# Patient Record
Sex: Male | Born: 1940 | Race: White | Hispanic: No | Marital: Married | Smoking: Never smoker
Health system: Southern US, Community
[De-identification: ages and names within clinical notes are randomized; demographics above are authoritative.]

## PROBLEM LIST (undated history)

## (undated) DIAGNOSIS — K2980 Duodenitis without bleeding: Secondary | ICD-10-CM

## (undated) DIAGNOSIS — E079 Disorder of thyroid, unspecified: Secondary | ICD-10-CM

## (undated) DIAGNOSIS — E785 Hyperlipidemia, unspecified: Secondary | ICD-10-CM

## (undated) DIAGNOSIS — D126 Benign neoplasm of colon, unspecified: Secondary | ICD-10-CM

## (undated) DIAGNOSIS — K429 Umbilical hernia without obstruction or gangrene: Secondary | ICD-10-CM

## (undated) DIAGNOSIS — C8590 Non-Hodgkin lymphoma, unspecified, unspecified site: Secondary | ICD-10-CM

## (undated) DIAGNOSIS — N400 Enlarged prostate without lower urinary tract symptoms: Secondary | ICD-10-CM

## (undated) HISTORY — PX: COLONOSCOPY: SHX174

## (undated) HISTORY — DX: Non-Hodgkin lymphoma, unspecified, unspecified site: C85.90

## (undated) HISTORY — PX: APPENDECTOMY: SHX54

## (undated) HISTORY — PX: COLONOSCOPY W/ POLYPECTOMY: SHX1380

## (undated) HISTORY — DX: Benign neoplasm of colon, unspecified: D12.6

## (undated) HISTORY — DX: Duodenitis without bleeding: K29.80

## (undated) HISTORY — DX: Umbilical hernia without obstruction or gangrene: K42.9

## (undated) HISTORY — DX: Hyperlipidemia, unspecified: E78.5

## (undated) HISTORY — PX: POLYPECTOMY: SHX149

## (undated) HISTORY — DX: Disorder of thyroid, unspecified: E07.9

## (undated) HISTORY — DX: Benign prostatic hyperplasia without lower urinary tract symptoms: N40.0

---

## 2004-01-16 HISTORY — PX: INGUINAL HERNIA REPAIR: SHX194

## 2004-10-10 ENCOUNTER — Ambulatory Visit: Payer: Self-pay | Admitting: Gastroenterology

## 2004-11-15 ENCOUNTER — Ambulatory Visit: Payer: Self-pay | Admitting: Gastroenterology

## 2006-07-22 ENCOUNTER — Ambulatory Visit (HOSPITAL_COMMUNITY): Admission: RE | Admit: 2006-07-22 | Discharge: 2006-07-22 | Payer: Self-pay | Admitting: Family Medicine

## 2007-12-09 ENCOUNTER — Ambulatory Visit: Payer: Self-pay | Admitting: Gastroenterology

## 2007-12-22 ENCOUNTER — Encounter: Payer: Self-pay | Admitting: Gastroenterology

## 2007-12-22 ENCOUNTER — Ambulatory Visit: Payer: Self-pay | Admitting: Gastroenterology

## 2007-12-24 ENCOUNTER — Encounter: Payer: Self-pay | Admitting: Gastroenterology

## 2010-07-13 ENCOUNTER — Ambulatory Visit (AMBULATORY_SURGERY_CENTER): Payer: Medicare Other | Admitting: *Deleted

## 2010-07-13 VITALS — Ht 68.0 in | Wt 170.0 lb

## 2010-07-13 DIAGNOSIS — Z860100 Personal history of colon polyps, unspecified: Secondary | ICD-10-CM

## 2010-07-13 MED ORDER — PEG-KCL-NACL-NASULF-NA ASC-C 100 G PO SOLR: ORAL | Status: DC

## 2010-07-26 ENCOUNTER — Other Ambulatory Visit: Payer: Medicare Other | Admitting: Gastroenterology

## 2010-07-27 ENCOUNTER — Other Ambulatory Visit: Payer: Self-pay | Admitting: Gastroenterology

## 2010-07-31 ENCOUNTER — Ambulatory Visit (AMBULATORY_SURGERY_CENTER): Payer: Medicare Other | Admitting: Gastroenterology

## 2010-07-31 ENCOUNTER — Encounter: Payer: Self-pay | Admitting: Gastroenterology

## 2010-07-31 VITALS — BP 113/72 | HR 73 | Temp 97.7°F | Resp 20 | Ht 68.0 in | Wt 165.0 lb

## 2010-07-31 DIAGNOSIS — K6389 Other specified diseases of intestine: Secondary | ICD-10-CM

## 2010-07-31 DIAGNOSIS — Z86010 Personal history of colonic polyps: Secondary | ICD-10-CM

## 2010-07-31 DIAGNOSIS — D126 Benign neoplasm of colon, unspecified: Secondary | ICD-10-CM

## 2010-07-31 MED ORDER — SODIUM CHLORIDE 0.9 % IV SOLN: 500.0000 mL | INTRAVENOUS | Status: DC

## 2010-07-31 NOTE — Progress Notes (Signed)
15:53 hung second bag of n.s. 0.9%. mAW  Pt tolerated the exam very well. MAW

## 2010-07-31 NOTE — Progress Notes (Signed)
Please call pt wife, Augusto Gamble,  cell phone with results of pathology [phone].

## 2010-08-01 ENCOUNTER — Telehealth: Payer: Self-pay | Admitting: *Deleted

## 2010-08-01 DIAGNOSIS — R933 Abnormal findings on diagnostic imaging of other parts of digestive tract: Secondary | ICD-10-CM

## 2010-08-01 NOTE — Telephone Encounter (Signed)
Left message for pt to call back  °

## 2010-08-01 NOTE — Telephone Encounter (Signed)

## 2010-08-01 NOTE — Telephone Encounter (Signed)
Wife aware and they will have to come for labs and ct on 08/04/2010. I will call her back after everything is scheduled.

## 2010-08-01 NOTE — Telephone Encounter (Signed)
pts wife aware that pt needs to come at 11:30am on 08/04/2010 to the basement for labs before his CT scan, labs are in epic and ordered STAT. They will need to then come to the 3rd floor for ct contrast and instructions I have left at the front desk. I have went over the CT instructions but there is a written copy in the bag with the cntrast. They need to be at [facility] CT at 2:45pm for a 3 PM  CT. If there is any change in their schedule they will call me back .

## 2010-08-04 ENCOUNTER — Telehealth: Payer: Self-pay | Admitting: *Deleted

## 2010-08-04 ENCOUNTER — Other Ambulatory Visit: Payer: Self-pay | Admitting: Gastroenterology

## 2010-08-04 ENCOUNTER — Ambulatory Visit (INDEPENDENT_AMBULATORY_CARE_PROVIDER_SITE_OTHER)
Admission: RE | Admit: 2010-08-04 | Discharge: 2010-08-04 | Disposition: A | Discharge status: Home / Self Care | Payer: Medicare Other | Source: Ambulatory Visit | Attending: Gastroenterology | Admitting: Gastroenterology

## 2010-08-04 ENCOUNTER — Other Ambulatory Visit (INDEPENDENT_AMBULATORY_CARE_PROVIDER_SITE_OTHER): Payer: Medicare Other

## 2010-08-04 DIAGNOSIS — R933 Abnormal findings on diagnostic imaging of other parts of digestive tract: Secondary | ICD-10-CM

## 2010-08-04 DIAGNOSIS — R6889 Other general symptoms and signs: Secondary | ICD-10-CM

## 2010-08-04 DIAGNOSIS — K625 Hemorrhage of anus and rectum: Secondary | ICD-10-CM

## 2010-08-04 LAB — BUN: BUN: 11 mg/dL (ref 6–23)

## 2010-08-04 LAB — CREATININE, SERUM: Creatinine, Ser: 0.9 mg/dL (ref 0.4–1.5)

## 2010-08-04 MED ORDER — IOHEXOL 300 MG/ML  SOLN
100.0000 mL | Freq: Once | INTRAMUSCULAR | Status: AC | PRN
  Administered 2010-08-04: 100 mL via INTRAVENOUS

## 2010-08-04 NOTE — Telephone Encounter (Signed)
Notified pt we got the results from his CT scan, but until we get the final pathology report back, he can't make a dx;  Dr Jarold Motto is concerned with some of his lymph nodes.  Dr Jarold Motto, hopefully, will call him Monday with the results. Pt stated understanding and asked he be called on his cell number, [phone].

## 2010-08-07 ENCOUNTER — Encounter: Payer: Self-pay | Admitting: Gastroenterology

## 2010-08-07 NOTE — Telephone Encounter (Addendum)
Faxed COLON and path reports to Dr Truett Perna and call Dr Kathi Der ofc in [place] for labs. Informed pt we have sent the info to an oncologist and will call back when we hear from hi. Pt requested we call back on this number; [place]

## 2010-08-07 NOTE — Telephone Encounter (Signed)
I CALLED HIM.WE NEED LABS FROM [facility] IN [place].PLEASE CALL HIM PER ONCOLOGY EVALUATION WITH SHERRILL ASAP.Marland KitchenMarland KitchenMarland Kitchen

## 2010-08-11 NOTE — Telephone Encounter (Signed)
lmom for pt to call back. Pt has an appt with Dr Mancel Bale at [facility] on 08/29/10 at 1:30pm.

## 2010-08-14 NOTE — Telephone Encounter (Signed)
Addended by: Florene Glen on: 08/14/2010 11:46 AM   Modules accepted: Orders

## 2010-08-14 NOTE — Telephone Encounter (Signed)
lmom for pt to call back to schedule labs.

## 2010-08-14 NOTE — Telephone Encounter (Signed)
Notified pt to have labs drawn; he will try to come Wednesday, 08/16/10.

## 2010-08-14 NOTE — Telephone Encounter (Signed)
Spoke with pt this am who stated his wife tried to get an earlier appt with Dr Truett Perna, but he will be out of town 8/6-08/23/10 and 08/29/10 is ok with him if he can't get an earlier appt. Informed Crystal, scheduler at the [employer].

## 2010-08-14 NOTE — Telephone Encounter (Signed)
HAVE COME IN FOR LABS .Marland KitchenMarland KitchenANEMIA PROFILE AND [place] PROFILE,CELIAC SEROLOGY AND SERUM PROTEIN ELECTROPHORESIS,SED RATE AND CRP.Marland KitchenMarland KitchenMarland Kitchen

## 2010-08-16 ENCOUNTER — Encounter (INDEPENDENT_AMBULATORY_CARE_PROVIDER_SITE_OTHER): Payer: Medicare Other

## 2010-08-16 ENCOUNTER — Other Ambulatory Visit: Payer: Self-pay | Admitting: Gastroenterology

## 2010-08-16 ENCOUNTER — Telehealth: Payer: Self-pay | Admitting: Gastroenterology

## 2010-08-16 DIAGNOSIS — R195 Other fecal abnormalities: Secondary | ICD-10-CM

## 2010-08-16 DIAGNOSIS — K625 Hemorrhage of anus and rectum: Secondary | ICD-10-CM

## 2010-08-16 DIAGNOSIS — R6889 Other general symptoms and signs: Secondary | ICD-10-CM

## 2010-08-16 DIAGNOSIS — R109 Unspecified abdominal pain: Secondary | ICD-10-CM

## 2010-08-16 LAB — BASIC METABOLIC PANEL
BUN: 18 mg/dL (ref 6–23)
Calcium: 9 mg/dL (ref 8.4–10.5)
Chloride: 102 mEq/L (ref 96–112)
Creatinine, Ser: 0.8 mg/dL (ref 0.4–1.5)
GFR: 105.95 mL/min (ref >60.00)

## 2010-08-16 LAB — CBC WITH DIFFERENTIAL/PLATELET
Basophils Relative: 0.7 % (ref 0.0–3.0)
Eosinophils Relative: 3.6 % (ref 0.0–5.0)
Hemoglobin: 14.4 g/dL (ref 13.0–17.0)
MCV: 87.9 fl (ref 78.0–100.0)
Monocytes Absolute: 0.8 10*3/uL (ref 0.1–1.0)
Neutro Abs: 4.7 10*3/uL (ref 1.4–7.7)
Neutrophils Relative %: 56 % (ref 43.0–77.0)
RBC: 4.99 Mil/uL (ref 4.22–5.81)
WBC: 8.4 10*3/uL (ref 4.5–10.5)

## 2010-08-16 LAB — SEDIMENTATION RATE: Sed Rate: 7 mm/hr (ref 0–22)

## 2010-08-16 LAB — VITAMIN B12: Vitamin B-12: 383 pg/mL (ref 211–911)

## 2010-08-16 LAB — FERRITIN: Ferritin: 11.7 ng/mL — ABNORMAL LOW (ref 22.0–322.0)

## 2010-08-16 LAB — IBC PANEL
Iron: 130 ug/dL (ref 42–165)
Transferrin: 325.5 mg/dL (ref 212.0–360.0)

## 2010-08-16 LAB — FOLATE: Folate: 19.5 ng/mL (ref >5.9)

## 2010-08-16 NOTE — Progress Notes (Signed)
Called pt and got a verbal ok to run lab he is aware insurance may not cover and the cost is about $35.

## 2010-08-17 LAB — CELIAC PANEL 10
Endomysial Screen: NEGATIVE
Gliadin IgA: 6.9 U/mL (ref <20)
Gliadin IgG: 12.1 U/mL (ref <20)
IgA: 113 mg/dL (ref 68–379)
Tissue Transglut Ab: 19 U/mL (ref <20)

## 2010-08-17 LAB — HEPATIC FUNCTION PANEL
ALT: 23 U/L (ref 0–53)
Bilirubin, Direct: 0.2 mg/dL (ref 0.0–0.3)
Total Bilirubin: 0.9 mg/dL (ref 0.3–1.2)

## 2010-08-17 NOTE — Patient Instructions (Signed)
Error

## 2010-08-18 LAB — PROTEIN ELECTROPHORESIS, SERUM
Beta 2: 4.1 % (ref 3.2–6.5)
Gamma Globulin: 16.3 % (ref 11.1–18.8)
M-Spike, %: 0.53 g/dL

## 2010-08-18 NOTE — Progress Notes (Signed)
This encounter was created in error - please disregard.  This encounter was created in error - please disregard.

## 2010-08-29 ENCOUNTER — Other Ambulatory Visit: Payer: Self-pay | Admitting: Oncology

## 2010-08-29 ENCOUNTER — Encounter (HOSPITAL_BASED_OUTPATIENT_CLINIC_OR_DEPARTMENT_OTHER): Payer: Medicare Other | Admitting: Oncology

## 2010-08-29 DIAGNOSIS — R918 Other nonspecific abnormal finding of lung field: Secondary | ICD-10-CM

## 2010-08-29 DIAGNOSIS — C8590 Non-Hodgkin lymphoma, unspecified, unspecified site: Secondary | ICD-10-CM

## 2010-08-29 DIAGNOSIS — D126 Benign neoplasm of colon, unspecified: Secondary | ICD-10-CM

## 2010-09-13 ENCOUNTER — Encounter (HOSPITAL_BASED_OUTPATIENT_CLINIC_OR_DEPARTMENT_OTHER): Payer: Medicare Other | Admitting: Oncology

## 2010-09-13 ENCOUNTER — Encounter (HOSPITAL_COMMUNITY)
Admission: RE | Admit: 2010-09-13 | Discharge: 2010-09-13 | Disposition: A | Discharge status: Home / Self Care | Payer: Medicare Other | Source: Ambulatory Visit | Attending: Oncology | Admitting: Oncology

## 2010-09-13 ENCOUNTER — Telehealth: Payer: Self-pay | Admitting: Gastroenterology

## 2010-09-13 DIAGNOSIS — Z79899 Other long term (current) drug therapy: Secondary | ICD-10-CM | POA: Insufficient documentation

## 2010-09-13 DIAGNOSIS — C772 Secondary and unspecified malignant neoplasm of intra-abdominal lymph nodes: Secondary | ICD-10-CM

## 2010-09-13 DIAGNOSIS — C8590 Non-Hodgkin lymphoma, unspecified, unspecified site: Secondary | ICD-10-CM

## 2010-09-13 DIAGNOSIS — M47817 Spondylosis without myelopathy or radiculopathy, lumbosacral region: Secondary | ICD-10-CM | POA: Insufficient documentation

## 2010-09-13 DIAGNOSIS — R935 Abnormal findings on diagnostic imaging of other abdominal regions, including retroperitoneum: Secondary | ICD-10-CM | POA: Insufficient documentation

## 2010-09-13 DIAGNOSIS — C8589 Other specified types of non-Hodgkin lymphoma, extranodal and solid organ sites: Secondary | ICD-10-CM | POA: Insufficient documentation

## 2010-09-13 MED ORDER — FLUDEOXYGLUCOSE F - 18 (FDG) INJECTION
18.0000 | Freq: Once | INTRAVENOUS | Status: AC | PRN
  Administered 2010-09-13: 18 via INTRAVENOUS

## 2010-09-14 ENCOUNTER — Telehealth: Payer: Self-pay | Admitting: *Deleted

## 2010-09-14 LAB — GLUCOSE, CAPILLARY: Glucose-Capillary: 121 mg/dL — ABNORMAL HIGH (ref 70–99)

## 2010-09-14 NOTE — Telephone Encounter (Signed)
Pt scheduled for EGD and FLEX per Dr Truett Perna request, pt coming tomorrow for pre visit and 09/25/2010 for procedures.

## 2010-09-14 NOTE — Telephone Encounter (Signed)
appt scheduled

## 2010-09-14 NOTE — Telephone Encounter (Signed)
Spoke with Dr. Truett Perna in oncology. He is recommended deepertissue biopsies of his colonic lesions and also endoscopy with gastric biopsies and exam for H. pylori. Recent PET scan did not show any abnormal nodes. He feels that these changes are either reactive or that the patient has a low-grade MALT tumor. We will schedule these procedures are with the patient as recommended.

## 2010-09-15 ENCOUNTER — Ambulatory Visit (AMBULATORY_SURGERY_CENTER): Payer: Medicare Other | Admitting: *Deleted

## 2010-09-15 VITALS — Ht 68.0 in | Wt 172.6 lb

## 2010-09-15 DIAGNOSIS — R933 Abnormal findings on diagnostic imaging of other parts of digestive tract: Secondary | ICD-10-CM

## 2010-09-25 ENCOUNTER — Encounter: Payer: Self-pay | Admitting: Gastroenterology

## 2010-09-25 ENCOUNTER — Ambulatory Visit (AMBULATORY_SURGERY_CENTER): Payer: Medicare Other | Admitting: Gastroenterology

## 2010-09-25 VITALS — BP 118/67 | HR 63 | Temp 96.7°F | Resp 20 | Ht 68.0 in | Wt 172.0 lb

## 2010-09-25 DIAGNOSIS — R933 Abnormal findings on diagnostic imaging of other parts of digestive tract: Secondary | ICD-10-CM | POA: Insufficient documentation

## 2010-09-25 DIAGNOSIS — K317 Polyp of stomach and duodenum: Secondary | ICD-10-CM

## 2010-09-25 DIAGNOSIS — K529 Noninfective gastroenteritis and colitis, unspecified: Secondary | ICD-10-CM

## 2010-09-25 DIAGNOSIS — K2990 Gastroduodenitis, unspecified, without bleeding: Secondary | ICD-10-CM | POA: Insufficient documentation

## 2010-09-25 DIAGNOSIS — K5289 Other specified noninfective gastroenteritis and colitis: Secondary | ICD-10-CM

## 2010-09-25 DIAGNOSIS — C8589 Other specified types of non-Hodgkin lymphoma, extranodal and solid organ sites: Secondary | ICD-10-CM

## 2010-09-25 HISTORY — DX: Polyp of stomach and duodenum: K31.7

## 2010-09-25 HISTORY — DX: Noninfective gastroenteritis and colitis, unspecified: K52.9

## 2010-09-25 MED ORDER — SODIUM CHLORIDE 0.9 % IV SOLN: 500.0000 mL | INTRAVENOUS | Status: DC

## 2010-09-25 NOTE — Patient Instructions (Signed)
Discharge instructions given with verbal understanding. Handout on gastritis given. Resume previous medications.

## 2010-09-26 ENCOUNTER — Telehealth: Payer: Self-pay | Admitting: *Deleted

## 2010-09-26 NOTE — Telephone Encounter (Signed)

## 2010-10-09 ENCOUNTER — Telehealth: Payer: Self-pay | Admitting: *Deleted

## 2010-10-09 NOTE — Telephone Encounter (Signed)
Spoke with pt to inform him we haven't forgot about him. We have not heard from pathology and when we called last week, the ofc stated the bx had been sent to [place]. I spoke with Dr Truett Perna who stated he still suspects Lymphoma, but they have to know which type and that's why the bx was sent to there for [facility]. Pt reminded he has a f/u with Dr Truett Perna this week and Dr Truett Perna hopes to have an answer for him then. Pt stated understanding. Informed Dr Jarold Motto.

## 2010-10-12 ENCOUNTER — Encounter (HOSPITAL_BASED_OUTPATIENT_CLINIC_OR_DEPARTMENT_OTHER): Payer: Medicare Other | Admitting: Oncology

## 2010-10-12 DIAGNOSIS — C8589 Other specified types of non-Hodgkin lymphoma, extranodal and solid organ sites: Secondary | ICD-10-CM

## 2010-11-14 ENCOUNTER — Other Ambulatory Visit: Payer: Self-pay | Admitting: Oncology

## 2010-11-14 ENCOUNTER — Encounter (HOSPITAL_BASED_OUTPATIENT_CLINIC_OR_DEPARTMENT_OTHER): Payer: Medicare Other | Admitting: Oncology

## 2010-11-14 DIAGNOSIS — C8583 Other specified types of non-Hodgkin lymphoma, intra-abdominal lymph nodes: Secondary | ICD-10-CM

## 2010-11-14 DIAGNOSIS — C772 Secondary and unspecified malignant neoplasm of intra-abdominal lymph nodes: Secondary | ICD-10-CM

## 2010-11-14 DIAGNOSIS — Z23 Encounter for immunization: Secondary | ICD-10-CM

## 2010-11-14 LAB — CBC WITH DIFFERENTIAL/PLATELET
Basophils Absolute: 0 10*3/uL (ref 0.0–0.1)
Eosinophils Absolute: 0.2 10*3/uL (ref 0.0–0.5)
HGB: 14.7 g/dL (ref 13.0–17.1)
LYMPH%: 35.8 % (ref 14.0–49.0)
MCH: 30.5 pg (ref 27.2–33.4)
MCHC: 33.4 g/dL (ref 32.0–36.0)
MCV: 91.2 fL (ref 79.3–98.0)
MONO#: 0.7 10*3/uL (ref 0.1–0.9)
NEUT%: 50.9 % (ref 39.0–75.0)
RBC: 4.83 10*6/uL (ref 4.20–5.82)

## 2010-11-15 ENCOUNTER — Encounter (HOSPITAL_BASED_OUTPATIENT_CLINIC_OR_DEPARTMENT_OTHER): Payer: Medicare Other | Admitting: Oncology

## 2010-11-15 ENCOUNTER — Other Ambulatory Visit: Payer: Self-pay | Admitting: Oncology

## 2010-11-15 DIAGNOSIS — C772 Secondary and unspecified malignant neoplasm of intra-abdominal lymph nodes: Secondary | ICD-10-CM

## 2010-11-15 LAB — COMPREHENSIVE METABOLIC PANEL
ALT: 27 U/L (ref 0–53)
BUN: 18 mg/dL (ref 6–23)
CO2: 25 mEq/L (ref 19–32)
Calcium: 9.3 mg/dL (ref 8.4–10.5)
Chloride: 105 mEq/L (ref 96–112)
Creatinine, Ser: 0.94 mg/dL (ref 0.50–1.35)

## 2010-11-16 LAB — HEPATITIS B CORE ANTIBODY, TOTAL: Hep B Core Total Ab: NEGATIVE

## 2010-11-16 LAB — HEPATITIS C ANTIBODY: HCV Ab: NEGATIVE

## 2010-11-16 LAB — HEPATITIS B SURFACE ANTIGEN: Hepatitis B Surface Ag: NEGATIVE

## 2010-11-27 ENCOUNTER — Other Ambulatory Visit: Payer: Self-pay | Admitting: Oncology

## 2010-11-27 ENCOUNTER — Encounter: Payer: Self-pay | Admitting: Oncology

## 2010-11-27 DIAGNOSIS — C8590 Non-Hodgkin lymphoma, unspecified, unspecified site: Secondary | ICD-10-CM

## 2010-11-27 HISTORY — DX: Non-Hodgkin lymphoma, unspecified, unspecified site: C85.90

## 2010-11-28 ENCOUNTER — Ambulatory Visit (HOSPITAL_BASED_OUTPATIENT_CLINIC_OR_DEPARTMENT_OTHER): Payer: Medicare Other

## 2010-11-28 VITALS — BP 128/60 | HR 95 | Temp 98.1°F

## 2010-11-28 DIAGNOSIS — C8583 Other specified types of non-Hodgkin lymphoma, intra-abdominal lymph nodes: Secondary | ICD-10-CM

## 2010-11-28 DIAGNOSIS — Z5112 Encounter for antineoplastic immunotherapy: Secondary | ICD-10-CM

## 2010-11-28 DIAGNOSIS — C8590 Non-Hodgkin lymphoma, unspecified, unspecified site: Secondary | ICD-10-CM

## 2010-11-28 MED ORDER — ALTEPLASE 2 MG IJ SOLR
2.0000 mg | Freq: Once | INTRAMUSCULAR | Status: DC | PRN
  Filled 2010-11-28: qty 2

## 2010-11-28 MED ORDER — SODIUM CHLORIDE 0.9 % IV SOLN
Freq: Once | INTRAVENOUS | Status: AC
  Administered 2010-11-28: 09:00:00 via INTRAVENOUS

## 2010-11-28 MED ORDER — HEPARIN SOD (PORK) LOCK FLUSH 100 UNIT/ML IV SOLN
500.0000 [IU] | Freq: Once | INTRAVENOUS | Status: DC | PRN
  Filled 2010-11-28: qty 5

## 2010-11-28 MED ORDER — SODIUM CHLORIDE 0.9 % IJ SOLN
10.0000 mL | INTRAMUSCULAR | Status: DC | PRN
  Filled 2010-11-28: qty 10

## 2010-11-28 MED ORDER — SODIUM CHLORIDE 0.9 % IJ SOLN
3.0000 mL | INTRAMUSCULAR | Status: DC | PRN
  Filled 2010-11-28: qty 10

## 2010-11-28 MED ORDER — SODIUM CHLORIDE 0.9 % IV SOLN
375.0000 mg/m2 | Freq: Once | INTRAVENOUS | Status: AC
  Administered 2010-11-28: 700 mg via INTRAVENOUS
  Filled 2010-11-28: qty 70

## 2010-11-28 MED ORDER — DIPHENHYDRAMINE HCL 25 MG PO CAPS
50.0000 mg | ORAL_CAPSULE | Freq: Once | ORAL | Status: AC
  Administered 2010-11-28: 50 mg via ORAL

## 2010-11-28 MED ORDER — HEPARIN SOD (PORK) LOCK FLUSH 100 UNIT/ML IV SOLN
250.0000 [IU] | Freq: Once | INTRAVENOUS | Status: DC | PRN
  Filled 2010-11-28: qty 5

## 2010-11-28 MED ORDER — ACETAMINOPHEN 325 MG PO TABS
650.0000 mg | ORAL_TABLET | Freq: Once | ORAL | Status: AC
  Administered 2010-11-28: 650 mg via ORAL

## 2010-11-28 NOTE — Progress Notes (Signed)
Rituxan started at  66mls/hr X at 1020.

## 2010-11-29 ENCOUNTER — Telehealth: Payer: Self-pay | Admitting: *Deleted

## 2010-11-29 NOTE — Telephone Encounter (Signed)
Brad May is doing"fine, even better than expected.   No repercussions from treatment."  He is eating and drinking well.  Denies questions at this time.  Encouraged to call if needed.

## 2010-11-29 NOTE — Telephone Encounter (Signed)
Message copied by Augusto Garbe on Wed Nov 29, 2010 11:37 AM ------      Message from: Ardell Isaacs      Created: Tue Nov 28, 2010 11:32 AM      Regarding: Chemo follow up call      Contact: [phone]       Wife Augusto Gamble will be there  In and out, Gabriel Rung will be there all day.  1st time Rituxan therapy for Lymphoma

## 2010-12-05 ENCOUNTER — Ambulatory Visit (HOSPITAL_BASED_OUTPATIENT_CLINIC_OR_DEPARTMENT_OTHER): Payer: Medicare Other

## 2010-12-05 ENCOUNTER — Other Ambulatory Visit: Payer: Self-pay | Admitting: Oncology

## 2010-12-05 VITALS — BP 117/62 | HR 64 | Temp 99.1°F

## 2010-12-05 DIAGNOSIS — Z5112 Encounter for antineoplastic immunotherapy: Secondary | ICD-10-CM

## 2010-12-05 DIAGNOSIS — C8583 Other specified types of non-Hodgkin lymphoma, intra-abdominal lymph nodes: Secondary | ICD-10-CM

## 2010-12-05 DIAGNOSIS — C8590 Non-Hodgkin lymphoma, unspecified, unspecified site: Secondary | ICD-10-CM

## 2010-12-05 MED ORDER — SODIUM CHLORIDE 0.9 % IV SOLN
375.0000 mg/m2 | Freq: Once | INTRAVENOUS | Status: AC
  Administered 2010-12-05: 700 mg via INTRAVENOUS
  Filled 2010-12-05: qty 70

## 2010-12-05 MED ORDER — SODIUM CHLORIDE 0.9 % IV SOLN
Freq: Once | INTRAVENOUS | Status: AC
  Administered 2010-12-05: 11:00:00 via INTRAVENOUS

## 2010-12-05 MED ORDER — DIPHENHYDRAMINE HCL 25 MG PO CAPS
50.0000 mg | ORAL_CAPSULE | Freq: Once | ORAL | Status: AC
  Administered 2010-12-05: 50 mg via ORAL

## 2010-12-05 MED ORDER — ACETAMINOPHEN 325 MG PO TABS
650.0000 mg | ORAL_TABLET | Freq: Once | ORAL | Status: AC
  Administered 2010-12-05: 650 mg via ORAL

## 2010-12-05 NOTE — Progress Notes (Signed)
Pt stable.  Rate increased to 182 ml's/hr for remainder of dose.

## 2010-12-05 NOTE — Progress Notes (Signed)
rituxan rate increased to 200mg /hr= 91ml x 45ml.  Vss.  D Zareth Rippetoe rn

## 2010-12-05 NOTE — Progress Notes (Signed)
rituxan rate increased to 300mg /hr=143ml x 62ml. Vss.  D Keyaan Lederman rn

## 2010-12-13 ENCOUNTER — Other Ambulatory Visit: Payer: Self-pay | Admitting: Oncology

## 2010-12-14 ENCOUNTER — Ambulatory Visit (HOSPITAL_BASED_OUTPATIENT_CLINIC_OR_DEPARTMENT_OTHER): Payer: Medicare Other | Admitting: Nurse Practitioner

## 2010-12-14 ENCOUNTER — Other Ambulatory Visit (HOSPITAL_BASED_OUTPATIENT_CLINIC_OR_DEPARTMENT_OTHER): Payer: Medicare Other | Admitting: Lab

## 2010-12-14 ENCOUNTER — Ambulatory Visit (HOSPITAL_BASED_OUTPATIENT_CLINIC_OR_DEPARTMENT_OTHER): Payer: Medicare Other

## 2010-12-14 ENCOUNTER — Other Ambulatory Visit: Payer: Self-pay | Admitting: Oncology

## 2010-12-14 VITALS — BP 128/83 | HR 69 | Temp 98.7°F | Ht 68.0 in | Wt 174.2 lb

## 2010-12-14 VITALS — BP 103/72 | HR 68 | Temp 97.8°F

## 2010-12-14 DIAGNOSIS — C8590 Non-Hodgkin lymphoma, unspecified, unspecified site: Secondary | ICD-10-CM

## 2010-12-14 DIAGNOSIS — C8583 Other specified types of non-Hodgkin lymphoma, intra-abdominal lymph nodes: Secondary | ICD-10-CM

## 2010-12-14 DIAGNOSIS — Z5112 Encounter for antineoplastic immunotherapy: Secondary | ICD-10-CM

## 2010-12-14 LAB — CBC WITH DIFFERENTIAL/PLATELET
BASO%: 1 % (ref 0.0–2.0)
Basophils Absolute: 0.1 10*3/uL (ref 0.0–0.1)
HCT: 44 % (ref 38.4–49.9)
HGB: 15.2 g/dL (ref 13.0–17.1)
MCHC: 34.5 g/dL (ref 32.0–36.0)
MONO#: 0.7 10*3/uL (ref 0.1–0.9)
NEUT%: 39.2 % (ref 39.0–75.0)
WBC: 6 10*3/uL (ref 4.0–10.3)
lymph#: 2.7 10*3/uL (ref 0.9–3.3)

## 2010-12-14 MED ORDER — DIPHENHYDRAMINE HCL 25 MG PO CAPS
50.0000 mg | ORAL_CAPSULE | Freq: Once | ORAL | Status: AC
  Administered 2010-12-14: 50 mg via ORAL

## 2010-12-14 MED ORDER — ACETAMINOPHEN 325 MG PO TABS
650.0000 mg | ORAL_TABLET | Freq: Once | ORAL | Status: AC
  Administered 2010-12-14: 650 mg via ORAL

## 2010-12-14 MED ORDER — SODIUM CHLORIDE 0.9 % IV SOLN
Freq: Once | INTRAVENOUS | Status: AC
  Administered 2010-12-14: 14:00:00 via INTRAVENOUS

## 2010-12-14 MED ORDER — SODIUM CHLORIDE 0.9 % IV SOLN
375.0000 mg/m2 | Freq: Once | INTRAVENOUS | Status: AC
  Administered 2010-12-14: 700 mg via INTRAVENOUS
  Filled 2010-12-14: qty 70

## 2010-12-14 NOTE — Progress Notes (Signed)
OFFICE PROGRESS NOTE  Interval history:  Brad May returns as scheduled. He completed week 1 of Rituxan 11/28/2010 and week 2 12/05/2010. He felt "chilled" during the first infusion. He did not develop rigors. He completed the infusion without difficulty. He denies fevers. No sweats. No skin rash. He denies pain. He continues to note intermittent rectal bleeding. Overall the bleeding has improved.   Objective: Blood pressure 128/83, pulse 69, temperature 98.7 F (37.1 C), temperature source Oral, height 5\' 8"  (1.727 m), weight 174 lb 3.2 oz (79.017 kg).  Oropharynx is without thrush or ulceration. No palpable cervical, supraclavicular, right axillary or inguinal lymph nodes. Approximate 1 cm high left axillary lymph node. Lungs are clear. No wheezes or rales. Regular cardiac rhythm. Abdomen is soft and nontender. No organomegaly. Extremities are without edema. Calves soft and nontender.   Lab Results: Hemoglobin 15.2 white count 6.0 absolute neutrophil count 2.4 platelet count 189,000 Ferritin 13 on 11/14/2010  Studies/Results: No results found.  Medications: I have reviewed the patient's current medications.  Assessment/Plan:  1. History of intermittent rectal bleeding. 2. 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   a. 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   b. CT of the abdomen and pelvis 08/04/2010 with abdominal/pelvic lymphadenopathy. c. PET scan 09/13/2010 confirming mildly enlarged abdominal/pelvic lymph nodes with borderline increased FDG activity. 3. History of iron deficiency.  Ferritin was in the low normal range on 11/14/2010. Hemoglobin remains normal. 4. ? left upper lung nodule on a chest x-ray  08/16/2010.   Disposition-Brad May appears stable. Plan to proceed with week 3 Rituxan today as scheduled. He will return for the fourth and final treatment in one week. He will return for a followup visit with Brad May on 01/12/2011. He will contact the office in the interim with any problems.  Plan reviewed with Brad May.  Lonna Cobb ANP/GNP-BC

## 2010-12-14 NOTE — Progress Notes (Signed)
At 1530hr, v/s and pt. Stable.  Rituxan rate increase to 200mg /hr x81ml/hr x63ml. HL

## 2010-12-14 NOTE — Progress Notes (Signed)
1600: VSS pt tolerating Rituxan well. Rate increased to 174mls/hr X 69 mls. 1630: Continues to tolerate well. Rate increased to 147mls/hr X .

## 2010-12-14 NOTE — Patient Instructions (Signed)
[facility] Discharge Instructions for Patients Receiving Chemotherapy  Today you received the following Ritxuan.  BELOW ARE SYMPTOMS THAT SHOULD BE REPORTED IMMEDIATELY:  *FEVER GREATER THAN 100.5 F  *CHILLS WITH OR WITHOUT FEVER  NAUSEA AND VOMITING THAT IS NOT CONTROLLED WITH YOUR NAUSEA MEDICATION  *UNUSUAL SHORTNESS OF BREATH  *UNUSUAL BRUISING OR BLEEDING  TENDERNESS IN MOUTH AND THROAT WITH OR WITHOUT PRESENCE OF ULCERS  *URINARY PROBLEMS  *BOWEL PROBLEMS  UNUSUAL RASH Items with * indicate a potential emergency and should be followed up as soon as possible.  One of the nurses will contact you 24 hours after your treatment. Please let the nurse know about any problems that you may have experienced. Feel free to call the clinic you have any questions or concerns. The clinic phone number is [phone].   I have been informed and understand all the instructions given to me. I know to contact the clinic, my physician, or go to the Emergency Department if any problems should occur. I do not have any questions at this time, but understand that I may call the clinic during office hours   should I have any questions or need assistance in obtaining follow up care.    __________________________________________  _____________  __________ Signature of Patient or Authorized Representative            Date                   Time    __________________________________________ Nurse's Signature

## 2010-12-14 NOTE — Progress Notes (Signed)
1745--infusion complete, pt tolerated well.  SLJ

## 2010-12-14 NOTE — Progress Notes (Signed)
Rituxan started at 1455 pm  ,   Vss,  TB

## 2010-12-20 ENCOUNTER — Other Ambulatory Visit: Payer: Self-pay | Admitting: Oncology

## 2010-12-21 ENCOUNTER — Ambulatory Visit (HOSPITAL_BASED_OUTPATIENT_CLINIC_OR_DEPARTMENT_OTHER): Payer: Medicare Other

## 2010-12-21 VITALS — BP 118/70 | HR 66 | Temp 98.8°F

## 2010-12-21 DIAGNOSIS — Z5112 Encounter for antineoplastic immunotherapy: Secondary | ICD-10-CM

## 2010-12-21 DIAGNOSIS — C8583 Other specified types of non-Hodgkin lymphoma, intra-abdominal lymph nodes: Secondary | ICD-10-CM

## 2010-12-21 DIAGNOSIS — C8590 Non-Hodgkin lymphoma, unspecified, unspecified site: Secondary | ICD-10-CM

## 2010-12-21 MED ORDER — DIPHENHYDRAMINE HCL 25 MG PO CAPS
50.0000 mg | ORAL_CAPSULE | Freq: Once | ORAL | Status: AC
  Administered 2010-12-21: 50 mg via ORAL

## 2010-12-21 MED ORDER — SODIUM CHLORIDE 0.9 % IV SOLN
Freq: Once | INTRAVENOUS | Status: AC
  Administered 2010-12-21: 11:00:00 via INTRAVENOUS

## 2010-12-21 MED ORDER — SODIUM CHLORIDE 0.9 % IV SOLN
375.0000 mg/m2 | Freq: Once | INTRAVENOUS | Status: AC
  Administered 2010-12-21: 700 mg via INTRAVENOUS
  Filled 2010-12-21: qty 70

## 2010-12-21 MED ORDER — ACETAMINOPHEN 325 MG PO TABS
650.0000 mg | ORAL_TABLET | Freq: Once | ORAL | Status: AC
  Administered 2010-12-21: 650 mg via ORAL

## 2010-12-21 NOTE — Progress Notes (Signed)
Rituxan infusion started at 100 mg/hr: 11mL/hr X 23mL.  Rate increased to 200 mg/hr: 43mL/hr X 46 mL.  VSS. Patient has no complaints.  Rate increased to 300 mg/hr: 136 mL/hr X 68 mL.  VSS.  Patient has no complaints.  Rate increased to 400 mg/hr: 182 mL/hr X 91 mL.  VSS.  Patient has no complaints.    Rate remains 182 mL/hr for remainder of infusion.

## 2011-01-12 ENCOUNTER — Telehealth: Payer: Self-pay | Admitting: Oncology

## 2011-01-12 ENCOUNTER — Ambulatory Visit (HOSPITAL_BASED_OUTPATIENT_CLINIC_OR_DEPARTMENT_OTHER): Payer: Medicare Other | Admitting: Oncology

## 2011-01-12 ENCOUNTER — Telehealth: Payer: Self-pay | Admitting: Gastroenterology

## 2011-01-12 ENCOUNTER — Other Ambulatory Visit (HOSPITAL_BASED_OUTPATIENT_CLINIC_OR_DEPARTMENT_OTHER): Payer: Medicare Other | Admitting: Lab

## 2011-01-12 VITALS — BP 127/78 | HR 71 | Temp 96.9°F | Ht 68.0 in | Wt 177.4 lb

## 2011-01-12 DIAGNOSIS — C8589 Other specified types of non-Hodgkin lymphoma, extranodal and solid organ sites: Secondary | ICD-10-CM

## 2011-01-12 DIAGNOSIS — C8590 Non-Hodgkin lymphoma, unspecified, unspecified site: Secondary | ICD-10-CM

## 2011-01-12 DIAGNOSIS — Z79899 Other long term (current) drug therapy: Secondary | ICD-10-CM

## 2011-01-12 LAB — CBC WITH DIFFERENTIAL/PLATELET
EOS%: 5.4 % (ref 0.0–7.0)
Eosinophils Absolute: 0.4 10*3/uL (ref 0.0–0.5)
LYMPH%: 40.3 % (ref 14.0–49.0)
MCH: 31.1 pg (ref 27.2–33.4)
MCHC: 34.4 g/dL (ref 32.0–36.0)
MCV: 90.4 fL (ref 79.3–98.0)
MONO%: 10.5 % (ref 0.0–14.0)
Platelets: 191 10*3/uL (ref 140–400)
RBC: 4.94 10*6/uL (ref 4.20–5.82)
RDW: 14 % (ref 11.0–14.6)

## 2011-01-12 NOTE — Progress Notes (Signed)
OFFICE PROGRESS NOTE   INTERVAL HISTORY:   He completed the last treatment with Rituxan on 12/21/2010. He reports less abdominal bloating. The rectal bleeding has diminished. He feels well. He has a good appetite and energy level. He has an occasional cough. He denies shortness of breath and fever. He is taking a probiotic.  Objective:  Vital signs in last 24 hours:  Blood pressure 127/78, pulse 71, temperature 96.9 F (36.1 C), temperature source Oral, height 5\' 8"  (1.727 m), weight 177 lb 6.4 oz (80.468 kg).    HEENT: Neck without mass Lymphatics: No cervical, supraclavicular, or inguinal nodes. Prominent right greater than left axillary fat pads.? 1 cm right axillary node. Resp: Few scattered end inspiratory rales. Good air movement bilaterally. No respiratory distress. Cardio: Regular rate and rhythm GI: No hepatosplenomegaly. Nontender. Vascular:  No leg edema      Lab Results:  Lab Results  Component Value Date   WBC 7.1 01/12/2011   HGB 15.4 01/12/2011   HCT 44.7 01/12/2011   MCV 90.4 01/12/2011   PLT 191 01/12/2011   ANC 3.1   Medications: I have reviewed the patient's current medications.  Assessment/Plan: 1. History of intermittent rectal bleeding. 2. 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  a. 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  b. CT of the abdomen and pelvis 08/04/2010 with abdominal/pelvic lymphadenopathy. c. PET scan 09/13/2010 confirming mildly enlarged abdominal/pelvic lymph nodes with borderline increased FDG activity. d. Initiation of single agent rituximab therapy on 11/28/2010, he completed 4 weekly treatments on 12/21/2010 3. History of iron deficiency. Ferritin was in the  low normal range on 11/14/2010. Hemoglobin remains normal. 4. ? left upper lung nodule on a chest x-ray 08/16/2010   Disposition:  He tolerated the 4 weeks of rituximab without significant acute toxicity. He appears well today. We will refer him to Dr. Jarold Motto for a restaging sigmoidoscopy in approximately one month. We will schedule a restaging PET scan if there is not clear evidence of improvement on the sigmoidoscopy.  He will return for an office visit in 2 months. He will contact us in the interim for new symptoms.   Lucile Shutters, MD  01/12/2011  6:02 PM

## 2011-01-12 NOTE — Telephone Encounter (Signed)
Called pt who states he needs a f/u prior to his 03/13/11 f/u with Dr Truett Perna. Explained our schedule is not out that far, but I will call him with an appt; pt stated understanding. Reminder sent.

## 2011-01-12 NOTE — Telephone Encounter (Signed)
Talked to [place] in [facility], they will call pt for appt in February. Template is not up for February 2013. Iinformed patient to expect a call from Dr Jarold Motto office in January, will also follow up in January for pt's appt.

## 2011-01-22 ENCOUNTER — Telehealth: Payer: Self-pay | Admitting: *Deleted

## 2011-01-22 NOTE — Telephone Encounter (Signed)
Message copied by Florene Glen on Mon Jan 22, 2011  2:26 PM ------      Message from: Florene Glen      Created: Fri Jan 12, 2011  5:17 PM       Pt needs f/u with Dr Jarold Motto before 03/13/11 visit with Dr Truett Perna.

## 2011-01-22 NOTE — Telephone Encounter (Signed)
Pt scheduled for previsit for 02/16/11@11am , Flex-sig scheduled with Dr. Jarold Motto for 03/02/11@1 :30pm. Called pt and left message for pt to call back regarding appt dates and times.

## 2011-01-22 NOTE — Telephone Encounter (Signed)
lmom for pt to call back

## 2011-01-22 NOTE — Telephone Encounter (Signed)
Per Dr Kalman Drape 01/12/11 note: He tolerated the 4 weeks of rituximab without significant acute toxicity. He appears well today. We will refer him to Dr. Jarold Motto for a restaging sigmoidoscopy in approximately one month. We will schedule a restaging PET scan if there is not clear evidence of improvement on the sigmoidoscopy.  He will return for an office visit in 2 months. He will contact us in the interim for new symptoms.  Dr Jarold Motto, does pt need an OV prior to FLEX SIG?

## 2011-01-22 NOTE — Telephone Encounter (Signed)
NO

## 2011-01-23 NOTE — Telephone Encounter (Signed)
Direct procedure

## 2011-01-25 NOTE — Telephone Encounter (Signed)
Spoke with pt and since he had a previous Flex Sig, he doesn't feel as though he needs a Pre Visit. Pre Visit was cancelled and I will mail his prep instructions for 03/02/11 at 1:30pm. Pt stated understanding.

## 2011-03-02 ENCOUNTER — Ambulatory Visit (AMBULATORY_SURGERY_CENTER): Payer: Medicare Other | Admitting: Gastroenterology

## 2011-03-02 ENCOUNTER — Encounter: Payer: Self-pay | Admitting: Gastroenterology

## 2011-03-02 VITALS — BP 128/78 | HR 67 | Temp 97.9°F | Resp 20 | Ht 68.0 in | Wt 172.0 lb

## 2011-03-02 DIAGNOSIS — C8590 Non-Hodgkin lymphoma, unspecified, unspecified site: Secondary | ICD-10-CM

## 2011-03-02 DIAGNOSIS — C8589 Other specified types of non-Hodgkin lymphoma, extranodal and solid organ sites: Secondary | ICD-10-CM | POA: Diagnosis not present

## 2011-03-02 DIAGNOSIS — C772 Secondary and unspecified malignant neoplasm of intra-abdominal lymph nodes: Secondary | ICD-10-CM | POA: Diagnosis not present

## 2011-03-02 DIAGNOSIS — R933 Abnormal findings on diagnostic imaging of other parts of digestive tract: Secondary | ICD-10-CM

## 2011-03-02 DIAGNOSIS — F411 Generalized anxiety disorder: Secondary | ICD-10-CM | POA: Diagnosis not present

## 2011-03-02 MED ORDER — SODIUM CHLORIDE 0.9 % IV SOLN: 500.0000 mL | INTRAVENOUS | Status: DC

## 2011-03-02 NOTE — Patient Instructions (Addendum)
Green and blue discharge instructions reviewed with patient and care partner.  Impressions/recommendations:  Nodular mucosa; unchanged from before. Continue current medications.

## 2011-03-02 NOTE — Op Note (Signed)
[facility] [address] [place], [place]  [address]  FLEXIBLE SIGMOIDOSCOPY PROCEDURE REPORT  PATIENT:  Brad May, Brad May  MR#:  [id number] BIRTHDATE:  [birth year], 70 yrs. old  GENDER:  male  ENDOSCOPIST:  Vania Rea. Jarold Motto, MD, [facility] Referred by:  Lavada Mesi Truett Perna, M.D.  PROCEDURE DATE:  03/02/2011 PROCEDURE:  Flexible Sigmoidoscopy with biopsy ASA CLASS:  Class II INDICATIONS:  f/u colonic lymphoma.  MEDICATIONS:   propofol (Diprivan) 160 mg IV  DESCRIPTION OF PROCEDURE:   After the risks benefits and alternatives of the procedure were thoroughly explained, informed consent was obtained.  Digital rectal exam was performed and revealed no abnormalities.   The [id number] endoscope was introduced through the anus and advanced to the descending colon, limited by poor preparation.   The quality of the prep was poor.  The instrument was then slowly withdrawn as the mucosa was fully examined. <<PROCEDUREIMAGES>>  Nodular mucosa was found. SACTTERED 5-8 MM SUBMUCOSAL NODULES BIOPSIED.   Retroflexed views in the rectum revealed no abnormalities.    The scope was then withdrawn from the patient and the procedure terminated.  COMPLICATIONS:  None  ENDOSCOPIC IMPRESSION: 1) Nodular mucosa UNCHANGED EXAM FROM BEFORE RX.NO ULCERATIONS,BLEEDING,OBSTRUCTION.DEEP BIOPSIES DONE FOR PATH REVIEW. RECOMMENDATIONS: 1) continue current meds 2) await biopsy results  REPEAT EXAM:  No  ______________________________ Vania Rea. Jarold Motto, MD, [facility] Graham  CC:  Rudi Heap, MD  n. Rosalie Doctor:   Vania Rea. Patterson at 03/02/2011 01:59 PM  Kivi, Jomarie Longs, [phone]

## 2011-03-02 NOTE — Progress Notes (Signed)
Patient did not experience any of the following events: a burn prior to discharge; a fall within the facility; wrong site/side/patient/procedure/implant event; or a hospital transfer or hospital admission upon discharge from the facility. (G8907) Patient did not have preoperative order for IV antibiotic SSI prophylaxis. (G8918) Patient did not have preoperative order for IV antibiotic SSI prophylaxis. (G8918)  

## 2011-03-05 ENCOUNTER — Telehealth: Payer: Self-pay | Admitting: *Deleted

## 2011-03-05 NOTE — Telephone Encounter (Signed)
  Follow up Call-  Call back number 03/02/2011 09/25/2010 07/31/2010  Post procedure Call Back phone  # [phone]  Permission to leave phone message Yes - -     Patient questions:  Do you have a fever, pain , or abdominal swelling? no Pain Score  0 *  Have you tolerated food without any problems? yes  Have you been able to return to your normal activities? yes  Do you have any questions about your discharge instructions: Diet   no Medications  no Follow up visit  no  Do you have questions or concerns about your Care? no  Actions: * If pain score is 4 or above: No action needed, pain <4.

## 2011-03-08 NOTE — Telephone Encounter (Signed)
Completed.

## 2011-03-09 ENCOUNTER — Encounter: Payer: Self-pay | Admitting: Gastroenterology

## 2011-03-13 ENCOUNTER — Telehealth: Payer: Self-pay | Admitting: Oncology

## 2011-03-13 ENCOUNTER — Ambulatory Visit (HOSPITAL_BASED_OUTPATIENT_CLINIC_OR_DEPARTMENT_OTHER): Payer: Medicare Other | Admitting: Oncology

## 2011-03-13 ENCOUNTER — Other Ambulatory Visit: Payer: Medicare Other | Admitting: Lab

## 2011-03-13 VITALS — BP 119/74 | HR 70 | Temp 97.0°F | Ht 68.0 in | Wt 173.3 lb

## 2011-03-13 DIAGNOSIS — C8590 Non-Hodgkin lymphoma, unspecified, unspecified site: Secondary | ICD-10-CM

## 2011-03-13 DIAGNOSIS — E039 Hypothyroidism, unspecified: Secondary | ICD-10-CM | POA: Diagnosis not present

## 2011-03-13 DIAGNOSIS — N400 Enlarged prostate without lower urinary tract symptoms: Secondary | ICD-10-CM | POA: Diagnosis not present

## 2011-03-13 DIAGNOSIS — E559 Vitamin D deficiency, unspecified: Secondary | ICD-10-CM | POA: Diagnosis not present

## 2011-03-13 DIAGNOSIS — C8589 Other specified types of non-Hodgkin lymphoma, extranodal and solid organ sites: Secondary | ICD-10-CM | POA: Diagnosis not present

## 2011-03-13 DIAGNOSIS — E785 Hyperlipidemia, unspecified: Secondary | ICD-10-CM | POA: Diagnosis not present

## 2011-03-13 LAB — CBC WITH DIFFERENTIAL/PLATELET
Basophils Absolute: 0 10*3/uL (ref 0.0–0.1)
EOS%: 3.5 % (ref 0.0–7.0)
HCT: 45.8 % (ref 38.4–49.9)
HGB: 16.1 g/dL (ref 13.0–17.1)
MCH: 31.2 pg (ref 27.2–33.4)
MONO#: 0.9 10*3/uL (ref 0.1–0.9)
NEUT#: 4.9 10*3/uL (ref 1.5–6.5)
NEUT%: 54.8 % (ref 39.0–75.0)
RDW: 13.5 % (ref 11.0–14.6)
WBC: 9 10*3/uL (ref 4.0–10.3)
lymph#: 2.8 10*3/uL (ref 0.9–3.3)

## 2011-03-13 LAB — FERRITIN: Ferritin: 29 ng/mL (ref 22–322)

## 2011-03-13 NOTE — Progress Notes (Signed)
OFFICE PROGRESS NOTE   INTERVAL HISTORY:   He returns as scheduled. He feels well. He has an occasional episode of rectal bleeding. He denies fever, night sweats, and anorexia. Graft Dr. Jarold Motto performed a flexible sigmoidoscopy on 03/02/2011. Nodular mucosa was then and scattered 5-8 mm submucosal nodules were biopsied. The pathology confirmed a B-cell lymphoid infiltrate consistent with a low-grade lymphoma.  Objective:  Vital signs in last 24 hours:  Blood pressure 119/74, pulse 70, temperature 97 F (36.1 C), temperature source Oral, height 5\' 8"  (1.727 m), weight 173 lb 4.8 oz (78.608 kg).    HEENT: Neck without mass Lymphatics: No cervical, supraclavicular, or inguinal nodes. Soft mobile 1/2-1 cm right greater than left axillary nodes versus prominent fat pads Resp: Lungs clear bilaterally Cardio: Regular rate and rhythm GI: No hepatosplenomegaly, nontender Vascular: No leg edema    Lab Results:  Lab Results  Component Value Date   WBC 9.0 03/13/2011   HGB 16.1 03/13/2011   HCT 45.8 03/13/2011   MCV 88.8 03/13/2011   PLT 185 03/13/2011   ANC 4.9   Medications: I have reviewed the patient's current medications.  Assessment/Plan: 1. History of intermittent rectal bleeding. 2. 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  a. 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  b. CT of the abdomen and pelvis 08/04/2010 with abdominal/pelvic lymphadenopathy. c. PET scan 09/13/2010 confirming mildly enlarged abdominal/pelvic lymph nodes with borderline increased FDG activity. d. Initiation of single agent rituximab therapy on 11/28/2010, he completed 4 weekly treatments on 12/21/2010 e. 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 3. History of iron deficiency. Ferritin was in the low normal range on 11/14/2010. Hemoglobin remains normal. 4. ? left upper lung nodule on a chest x-ray 08/16/2010-no hypermetabolic lung nodule was seen on a PET scan 09/13/2010  Disposition:  He appears stable. The only current symptom attributable to lymphoma is intermittent rectal bleeding. He completed treatment with rituximab 12/21/2010 and a repeat sigmoidoscopy revealed no significant change in the nodular mucosal lesions.  I discussed treatment options with Mr.Fulp and his wife. We discussed observation and systemic chemotherapy. We decided to make a referral to the lymphoma service at [facility] for another opinion. He will return for an office visit here in approximately 5 weeks.   Lucile Shutters, MD  03/13/2011  2:21 PM

## 2011-03-13 NOTE — Telephone Encounter (Signed)
Gv pt aptp for april2013.  Gv referral to [facility] to kim inmedical rec to schedule appts

## 2011-03-16 DIAGNOSIS — C8589 Other specified types of non-Hodgkin lymphoma, extranodal and solid organ sites: Secondary | ICD-10-CM | POA: Diagnosis not present

## 2011-03-19 ENCOUNTER — Telehealth: Payer: Self-pay | Admitting: *Deleted

## 2011-03-19 NOTE — Telephone Encounter (Signed)
Message copied by Wandalee Ferdinand on Mon Mar 19, 2011  4:09 PM ------      Message from: Thornton Papas B      Created: Wed Mar 14, 2011  7:21 AM       Please call patient, iron is now normal

## 2011-03-19 NOTE — Telephone Encounter (Signed)
Left VM with ferritin results

## 2011-03-27 DIAGNOSIS — C8589 Other specified types of non-Hodgkin lymphoma, extranodal and solid organ sites: Secondary | ICD-10-CM | POA: Diagnosis not present

## 2011-03-27 DIAGNOSIS — C8588 Other specified types of non-Hodgkin lymphoma, lymph nodes of multiple sites: Secondary | ICD-10-CM | POA: Diagnosis not present

## 2011-04-18 ENCOUNTER — Ambulatory Visit (HOSPITAL_BASED_OUTPATIENT_CLINIC_OR_DEPARTMENT_OTHER): Payer: Medicare Other | Admitting: Oncology

## 2011-04-18 ENCOUNTER — Ambulatory Visit: Payer: Medicare Other | Admitting: Lab

## 2011-04-18 ENCOUNTER — Telehealth: Payer: Self-pay | Admitting: Oncology

## 2011-04-18 VITALS — BP 115/72 | HR 69 | Temp 96.8°F | Ht 68.0 in | Wt 175.4 lb

## 2011-04-18 DIAGNOSIS — C8590 Non-Hodgkin lymphoma, unspecified, unspecified site: Secondary | ICD-10-CM

## 2011-04-18 DIAGNOSIS — C8589 Other specified types of non-Hodgkin lymphoma, extranodal and solid organ sites: Secondary | ICD-10-CM

## 2011-04-18 LAB — CBC WITH DIFFERENTIAL/PLATELET
BASO%: 0.9 % (ref 0.0–2.0)
Basophils Absolute: 0.1 10*3/uL (ref 0.0–0.1)
EOS%: 9 % — ABNORMAL HIGH (ref 0.0–7.0)
HGB: 15.5 g/dL (ref 13.0–17.1)
MCH: 31.7 pg (ref 27.2–33.4)
NEUT#: 4.5 10*3/uL (ref 1.5–6.5)
RBC: 4.9 10*6/uL (ref 4.20–5.82)
RDW: 13.1 % (ref 11.0–14.6)
lymph#: 1.7 10*3/uL (ref 0.9–3.3)
nRBC: 0 % (ref 0–0)

## 2011-04-18 LAB — COMPREHENSIVE METABOLIC PANEL
ALT: 23 U/L (ref 0–53)
AST: 25 U/L (ref 0–37)
Calcium: 9.2 mg/dL (ref 8.4–10.5)
Chloride: 110 mEq/L (ref 96–112)
Creatinine, Ser: 0.93 mg/dL (ref 0.50–1.35)
Sodium: 141 mEq/L (ref 135–145)
Total Bilirubin: 0.7 mg/dL (ref 0.3–1.2)
Total Protein: 6.7 g/dL (ref 6.0–8.3)

## 2011-04-18 NOTE — Telephone Encounter (Signed)
called pt and provided appt for 04/16-05/15

## 2011-04-18 NOTE — Progress Notes (Signed)
OFFICE PROGRESS NOTE   INTERVAL HISTORY:   He returns as scheduled. He feels well. He has intermittent rectal bleeding. He denies fever, night sweats, and anorexia. No palpable lymph nodes.  He saw Dr. Elmon Kirschner at [facility] for a second opinion. She recommends treatment with bendamustine/rituximab. He would like to begin this treatment.  Objective:  Vital signs in last 24 hours:  Blood pressure 115/72, pulse 69, temperature 96.8 F (36 C), temperature source Oral, height 5\' 8"  (1.727 m), weight 175 lb 6.4 oz (79.561 kg).    HEENT: Neck without mass. Oropharynx without visible mass Lymphatics: No cervical, supraclavicular, or inguinal nodes. 1 cm mobile right axillary node. Resp: Lungs clear bilaterally Cardio: Regular rate and rhythm GI: No hepatosplenomegaly, nontender, no mass Vascular: No leg edema  Lab Results:  Lab Results  Component Value Date   WBC 7.9 04/18/2011   HGB 15.5 04/18/2011   HCT 45.4 04/18/2011   MCV 92.6 04/18/2011   PLT 188 04/18/2011   ANC 4.5 LDH 172   Medications: I have reviewed the patient's current medications.  Assessment/Plan: 1. History of intermittent rectal bleeding. 2. 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  a. 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  b. CT of the abdomen and pelvis 08/04/2010 with abdominal/pelvic lymphadenopathy. c. PET scan 09/13/2010 confirming mildly enlarged abdominal/pelvic lymph nodes with borderline increased FDG activity. d. Initiation of single agent rituximab therapy on 11/28/2010, he completed 4 weekly treatments on 12/21/2010 e. 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 3. History of iron deficiency. Ferritin was in the low normal range on 11/14/2010. Hemoglobin remains normal. 4. ? left upper lung nodule on a chest x-ray 08/16/2010-no hypermetabolic lung nodule was seen on a PET scan 09/13/2010  Disposition:  I discussed treatment options with Mr. Glidden again today. He would like to proceed with bendamustine/rituximab as recommended by Dr.Beaven. We reviewed the potential toxicities associated with this regimen including a chance for nausea, and allergic reaction, hematologic toxicity, and infections. He would like to begin systemic therapy during the week of 04/30/2011. He will be scheduled for a nadir CBC. He will return for an office visit and the second cycle of chemotherapy at a two-month interval.  We will plan a restaging PET scan and endoscopic evaluation after 4 cycles of bendamustine/rituximab   Lucile Shutters, MD  04/18/2011  8:32 PM

## 2011-04-30 ENCOUNTER — Other Ambulatory Visit: Payer: Self-pay | Admitting: Oncology

## 2011-05-01 ENCOUNTER — Ambulatory Visit (HOSPITAL_BASED_OUTPATIENT_CLINIC_OR_DEPARTMENT_OTHER): Payer: Medicare Other

## 2011-05-01 VITALS — BP 106/63 | HR 79 | Temp 97.7°F

## 2011-05-01 DIAGNOSIS — Z5111 Encounter for antineoplastic chemotherapy: Secondary | ICD-10-CM | POA: Diagnosis not present

## 2011-05-01 DIAGNOSIS — C8589 Other specified types of non-Hodgkin lymphoma, extranodal and solid organ sites: Secondary | ICD-10-CM

## 2011-05-01 DIAGNOSIS — C8590 Non-Hodgkin lymphoma, unspecified, unspecified site: Secondary | ICD-10-CM

## 2011-05-01 MED ORDER — DOXYCYCLINE HYCLATE 100 MG PO TABS: 100.0000 mg | ORAL_TABLET | Freq: Two times a day (BID) | ORAL | Status: DC

## 2011-05-01 MED ORDER — ONDANSETRON 8 MG/50ML IVPB (CHCC)
8.0000 mg | Freq: Once | INTRAVENOUS | Status: AC
  Administered 2011-05-01: 8 mg via INTRAVENOUS

## 2011-05-01 MED ORDER — SODIUM CHLORIDE 0.9 % IV SOLN
Freq: Once | INTRAVENOUS | Status: AC
  Administered 2011-05-01: 09:00:00 via INTRAVENOUS

## 2011-05-01 MED ORDER — SODIUM CHLORIDE 0.9 % IV SOLN
70.0000 mg/m2 | Freq: Once | INTRAVENOUS | Status: AC
  Administered 2011-05-01: 135 mg via INTRAVENOUS
  Filled 2011-05-01: qty 27

## 2011-05-01 MED ORDER — SODIUM CHLORIDE 0.9 % IV SOLN
375.0000 mg/m2 | Freq: Once | INTRAVENOUS | Status: AC
  Administered 2011-05-01: 700 mg via INTRAVENOUS
  Filled 2011-05-01: qty 70

## 2011-05-01 MED ORDER — DIPHENHYDRAMINE HCL 25 MG PO CAPS
50.0000 mg | ORAL_CAPSULE | Freq: Once | ORAL | Status: AC
  Administered 2011-05-01: 50 mg via ORAL

## 2011-05-01 MED ORDER — DEXAMETHASONE SODIUM PHOSPHATE 10 MG/ML IJ SOLN
10.0000 mg | Freq: Once | INTRAMUSCULAR | Status: AC
  Administered 2011-05-01: 10 mg via INTRAVENOUS

## 2011-05-01 MED ORDER — ACETAMINOPHEN 325 MG PO TABS
650.0000 mg | ORAL_TABLET | Freq: Once | ORAL | Status: AC
  Administered 2011-05-01: 650 mg via ORAL

## 2011-05-02 ENCOUNTER — Ambulatory Visit (HOSPITAL_BASED_OUTPATIENT_CLINIC_OR_DEPARTMENT_OTHER): Payer: Medicare Other

## 2011-05-02 VITALS — BP 131/79 | HR 72 | Temp 98.0°F

## 2011-05-02 DIAGNOSIS — C8589 Other specified types of non-Hodgkin lymphoma, extranodal and solid organ sites: Secondary | ICD-10-CM | POA: Diagnosis not present

## 2011-05-02 DIAGNOSIS — C8590 Non-Hodgkin lymphoma, unspecified, unspecified site: Secondary | ICD-10-CM

## 2011-05-02 DIAGNOSIS — Z5112 Encounter for antineoplastic immunotherapy: Secondary | ICD-10-CM

## 2011-05-02 MED ORDER — SODIUM CHLORIDE 0.9 % IV SOLN
Freq: Once | INTRAVENOUS | Status: AC
  Administered 2011-05-02: 09:00:00 via INTRAVENOUS

## 2011-05-02 MED ORDER — ONDANSETRON 8 MG/50ML IVPB (CHCC)
8.0000 mg | Freq: Once | INTRAVENOUS | Status: AC
  Administered 2011-05-02: 8 mg via INTRAVENOUS

## 2011-05-02 MED ORDER — SODIUM CHLORIDE 0.9 % IV SOLN
70.0000 mg/m2 | Freq: Once | INTRAVENOUS | Status: AC
  Administered 2011-05-02: 135 mg via INTRAVENOUS
  Filled 2011-05-02: qty 27

## 2011-05-02 MED ORDER — DEXAMETHASONE SODIUM PHOSPHATE 10 MG/ML IJ SOLN
10.0000 mg | Freq: Once | INTRAMUSCULAR | Status: AC
  Administered 2011-05-02: 10 mg via INTRAVENOUS

## 2011-05-02 NOTE — Progress Notes (Signed)
Nickel sized reddened scabbed  lesion noted at  the right jawline.  Area assessed by MD on 05/01/2011. Patient states taking prescribed antibiotic.

## 2011-05-11 ENCOUNTER — Other Ambulatory Visit (HOSPITAL_BASED_OUTPATIENT_CLINIC_OR_DEPARTMENT_OTHER): Payer: Medicare Other | Admitting: Lab

## 2011-05-11 DIAGNOSIS — C8589 Other specified types of non-Hodgkin lymphoma, extranodal and solid organ sites: Secondary | ICD-10-CM | POA: Diagnosis not present

## 2011-05-11 DIAGNOSIS — C8590 Non-Hodgkin lymphoma, unspecified, unspecified site: Secondary | ICD-10-CM

## 2011-05-11 LAB — CBC WITH DIFFERENTIAL/PLATELET
BASO%: 0.9 % (ref 0.0–2.0)
LYMPH%: 10.6 % — ABNORMAL LOW (ref 14.0–49.0)
MCH: 31.4 pg (ref 27.2–33.4)
MCHC: 34.9 g/dL (ref 32.0–36.0)
MCV: 90.1 fL (ref 79.3–98.0)
MONO%: 19.7 % — ABNORMAL HIGH (ref 0.0–14.0)
NEUT%: 61.4 % (ref 39.0–75.0)
Platelets: 189 10*3/uL (ref 140–400)
RBC: 4.77 10*6/uL (ref 4.20–5.82)

## 2011-05-15 ENCOUNTER — Telehealth: Payer: Self-pay | Admitting: *Deleted

## 2011-05-15 NOTE — Telephone Encounter (Signed)
Left CBC results on his home voice mail. Made him aware we do not normally call in normal results. Suggested he let lab tech know he wants copy of results at next draw and wait in lobby and they will bring him copy to take home with him.

## 2011-05-15 NOTE — Telephone Encounter (Signed)
Patient left VM requesting results of 4/26 labs.

## 2011-05-27 ENCOUNTER — Other Ambulatory Visit: Payer: Self-pay | Admitting: Oncology

## 2011-05-28 ENCOUNTER — Ambulatory Visit: Payer: Medicare Other | Admitting: Oncology

## 2011-05-28 ENCOUNTER — Other Ambulatory Visit: Payer: Medicare Other | Admitting: Lab

## 2011-05-29 ENCOUNTER — Ambulatory Visit (HOSPITAL_BASED_OUTPATIENT_CLINIC_OR_DEPARTMENT_OTHER): Payer: Medicare Other

## 2011-05-29 ENCOUNTER — Ambulatory Visit (HOSPITAL_BASED_OUTPATIENT_CLINIC_OR_DEPARTMENT_OTHER): Payer: Medicare Other | Admitting: Oncology

## 2011-05-29 ENCOUNTER — Telehealth: Payer: Self-pay | Admitting: Oncology

## 2011-05-29 ENCOUNTER — Other Ambulatory Visit (HOSPITAL_BASED_OUTPATIENT_CLINIC_OR_DEPARTMENT_OTHER): Payer: Medicare Other | Admitting: Lab

## 2011-05-29 ENCOUNTER — Other Ambulatory Visit: Payer: Self-pay | Admitting: *Deleted

## 2011-05-29 VITALS — BP 116/68 | HR 76 | Temp 96.8°F | Ht 68.0 in | Wt 171.7 lb

## 2011-05-29 VITALS — BP 117/75 | HR 70 | Temp 98.2°F

## 2011-05-29 DIAGNOSIS — C8583 Other specified types of non-Hodgkin lymphoma, intra-abdominal lymph nodes: Secondary | ICD-10-CM

## 2011-05-29 DIAGNOSIS — C8590 Non-Hodgkin lymphoma, unspecified, unspecified site: Secondary | ICD-10-CM

## 2011-05-29 DIAGNOSIS — Z5112 Encounter for antineoplastic immunotherapy: Secondary | ICD-10-CM

## 2011-05-29 DIAGNOSIS — C8589 Other specified types of non-Hodgkin lymphoma, extranodal and solid organ sites: Secondary | ICD-10-CM | POA: Diagnosis not present

## 2011-05-29 LAB — COMPREHENSIVE METABOLIC PANEL
ALT: 28 U/L (ref 0–53)
AST: 23 U/L (ref 0–37)
CO2: 24 mEq/L (ref 19–32)
Chloride: 101 mEq/L (ref 96–112)
Creatinine, Ser: 0.78 mg/dL (ref 0.50–1.35)
Sodium: 136 mEq/L (ref 135–145)
Total Bilirubin: 0.5 mg/dL (ref 0.3–1.2)
Total Protein: 8.1 g/dL (ref 6.0–8.3)

## 2011-05-29 LAB — CBC WITH DIFFERENTIAL/PLATELET
BASO%: 0.6 % (ref 0.0–2.0)
EOS%: 9 % — ABNORMAL HIGH (ref 0.0–7.0)
LYMPH%: 19.6 % (ref 14.0–49.0)
MCH: 31.3 pg (ref 27.2–33.4)
MCHC: 35.3 g/dL (ref 32.0–36.0)
MONO#: 0.8 10*3/uL (ref 0.1–0.9)
RBC: 4.89 10*6/uL (ref 4.20–5.82)
WBC: 4.9 10*3/uL (ref 4.0–10.3)
lymph#: 1 10*3/uL (ref 0.9–3.3)

## 2011-05-29 MED ORDER — DIPHENHYDRAMINE HCL 25 MG PO CAPS
50.0000 mg | ORAL_CAPSULE | Freq: Once | ORAL | Status: AC
  Administered 2011-05-29: 50 mg via ORAL

## 2011-05-29 MED ORDER — ONDANSETRON 8 MG/50ML IVPB (CHCC)
8.0000 mg | Freq: Once | INTRAVENOUS | Status: AC
  Administered 2011-05-29: 8 mg via INTRAVENOUS

## 2011-05-29 MED ORDER — SODIUM CHLORIDE 0.9 % IV SOLN
70.0000 mg/m2 | Freq: Once | INTRAVENOUS | Status: AC
  Administered 2011-05-29: 135 mg via INTRAVENOUS
  Filled 2011-05-29: qty 27

## 2011-05-29 MED ORDER — ACETAMINOPHEN 325 MG PO TABS
650.0000 mg | ORAL_TABLET | Freq: Once | ORAL | Status: AC
  Administered 2011-05-29: 650 mg via ORAL

## 2011-05-29 MED ORDER — SODIUM CHLORIDE 0.9 % IV SOLN
Freq: Once | INTRAVENOUS | Status: AC
  Administered 2011-05-29: 10:00:00 via INTRAVENOUS

## 2011-05-29 MED ORDER — SODIUM CHLORIDE 0.9 % IV SOLN
375.0000 mg/m2 | Freq: Once | INTRAVENOUS | Status: AC
  Administered 2011-05-29: 700 mg via INTRAVENOUS
  Filled 2011-05-29: qty 70

## 2011-05-29 MED ORDER — DEXAMETHASONE SODIUM PHOSPHATE 10 MG/ML IJ SOLN
10.0000 mg | Freq: Once | INTRAMUSCULAR | Status: AC
  Administered 2011-05-29: 10 mg via INTRAVENOUS

## 2011-05-29 NOTE — Progress Notes (Signed)
   [facility]    OFFICE PROGRESS NOTE   INTERVAL HISTORY:   He returns as scheduled. He completed cycle bendamustine/Rituxan on 05/01/2011. He reports tolerating the chemotherapy well. He denies mouth sores, nausea, and diarrhea. No rectal bleeding for the past one month. He relates a mild sore throat to "allergies ". No fever, night sweats, or anorexia.  Objective:  Vital signs in last 24 hours:  Blood pressure 116/68, pulse 76, temperature 96.8 F (36 C), temperature source Oral, height 5\' 8"  (1.727 m), weight 171 lb 11.2 oz (77.883 kg).    HEENT: No thrush or ulcers Resp: Lungs clear bilaterally Cardio: Regular rate and rhythm GI: No hepatosplenomegaly, nontender Vascular: No leg edema   Lab Results:  Lab Results  Component Value Date   WBC 4.9 05/29/2011   HGB 15.3 05/29/2011   HCT 43.3 05/29/2011   MCV 88.5 05/29/2011   PLT 140 05/29/2011   ANC 2.7    Medications: I have reviewed the patient's current medications.  Assessment/Plan: 1. History of intermittent rectal bleeding. 2. 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  a. 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  b. CT of the abdomen and pelvis 08/04/2010 with abdominal/pelvic lymphadenopathy. c. PET scan 09/13/2010 confirming mildly enlarged abdominal/pelvic lymph nodes with borderline increased FDG activity. d. Initiation of single agent rituximab therapy on 11/28/2010, he completed 4 weekly treatments on 12/21/2010 e. 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 f. Cycle 1 bendamustine/rituximab 05/01/2011 3. History of iron  deficiency. Ferritin was in the low normal range on 11/14/2010. Hemoglobin remains normal. 4. ? left upper lung nodule on a chest x-ray 08/16/2010-no hypermetabolic lung nodule was seen on a PET scan 09/13/2010   Disposition:  He tolerated the first cycle of bendamustine/rituximab without significant acute toxicity. He appears well. The plan is to proceed with cycle 2 today. He will return for an office visit and cycle 3 in one month.  We will arrange for a restaging of valuation after cycle 3.   Thornton Papas, MD  05/29/2011  9:12 AM

## 2011-05-29 NOTE — Telephone Encounter (Signed)
appts made and printed for pt aom °

## 2011-05-29 NOTE — Patient Instructions (Signed)
[facility] Discharge Instructions for Patients Receiving Chemotherapy  Today you received the following chemotherapy agents Treanda & Rituxan To help prevent nausea and vomiting after your treatment, we encourage you to take your nausea medication as directed by your provider   If you develop nausea and vomiting that is not controlled by your nausea medication, call the clinic. If it is after clinic hours your family physician or the after hours number for the clinic or go to the Emergency Department.   BELOW ARE SYMPTOMS THAT SHOULD BE REPORTED IMMEDIATELY:  *FEVER GREATER THAN 100.5 F  *CHILLS WITH OR WITHOUT FEVER  NAUSEA AND VOMITING THAT IS NOT CONTROLLED WITH YOUR NAUSEA MEDICATION  *UNUSUAL SHORTNESS OF BREATH  *UNUSUAL BRUISING OR BLEEDING  TENDERNESS IN MOUTH AND THROAT WITH OR WITHOUT PRESENCE OF ULCERS  *URINARY PROBLEMS  *BOWEL PROBLEMS  UNUSUAL RASH Items with * indicate a potential emergency and should be followed up as soon as possible.  One of the nurses will contact you 24 hours after your treatment. Please let the nurse know about any problems that you may have experienced. Feel free to call the clinic you have any questions or concerns. The clinic phone number is [phone].   I have been informed and understand all the instructions given to me. I know to contact the clinic, my physician, or go to the Emergency Department if any problems should occur. I do not have any questions at this time, but understand that I may call the clinic during office hours   should I have any questions or need assistance in obtaining follow up care.    __________________________________________  _____________  __________ Signature of Patient or Authorized Representative            Date                   Time    __________________________________________ Nurse's Signature

## 2011-05-30 ENCOUNTER — Ambulatory Visit (HOSPITAL_BASED_OUTPATIENT_CLINIC_OR_DEPARTMENT_OTHER): Payer: Medicare Other

## 2011-05-30 VITALS — BP 132/78 | HR 71 | Temp 97.0°F

## 2011-05-30 DIAGNOSIS — Z5111 Encounter for antineoplastic chemotherapy: Secondary | ICD-10-CM | POA: Diagnosis not present

## 2011-05-30 DIAGNOSIS — C8590 Non-Hodgkin lymphoma, unspecified, unspecified site: Secondary | ICD-10-CM

## 2011-05-30 DIAGNOSIS — C8583 Other specified types of non-Hodgkin lymphoma, intra-abdominal lymph nodes: Secondary | ICD-10-CM | POA: Diagnosis not present

## 2011-05-30 MED ORDER — DEXAMETHASONE SODIUM PHOSPHATE 10 MG/ML IJ SOLN
10.0000 mg | Freq: Once | INTRAMUSCULAR | Status: AC
  Administered 2011-05-30: 10 mg via INTRAVENOUS

## 2011-05-30 MED ORDER — SODIUM CHLORIDE 0.9 % IV SOLN
70.0000 mg/m2 | Freq: Once | INTRAVENOUS | Status: AC
  Administered 2011-05-30: 135 mg via INTRAVENOUS
  Filled 2011-05-30: qty 27

## 2011-05-30 MED ORDER — ONDANSETRON 8 MG/50ML IVPB (CHCC)
8.0000 mg | Freq: Once | INTRAVENOUS | Status: AC
  Administered 2011-05-30: 8 mg via INTRAVENOUS

## 2011-05-30 NOTE — Patient Instructions (Signed)
[facility] Discharge Instructions for Patients Receiving Chemotherapy  Today you received the following chemotherapy agents Treanda.  To help prevent nausea and vomiting after your treatment, we encourage you to take your nausea medication as ordered per MD.    If you develop nausea and vomiting that is not controlled by your nausea medication, call the clinic. If it is after clinic hours your family physician or the after hours number for the clinic or go to the Emergency Department.   BELOW ARE SYMPTOMS THAT SHOULD BE REPORTED IMMEDIATELY:  *FEVER GREATER THAN 100.5 F  *CHILLS WITH OR WITHOUT FEVER  NAUSEA AND VOMITING THAT IS NOT CONTROLLED WITH YOUR NAUSEA MEDICATION  *UNUSUAL SHORTNESS OF BREATH  *UNUSUAL BRUISING OR BLEEDING  TENDERNESS IN MOUTH AND THROAT WITH OR WITHOUT PRESENCE OF ULCERS  *URINARY PROBLEMS  *BOWEL PROBLEMS  UNUSUAL RASH Items with * indicate a potential emergency and should be followed up as soon as possible.   Please let the nurse know about any problems that you may have experienced. Feel free to call the clinic you have any questions or concerns. The clinic phone number is [phone].   I have been informed and understand all the instructions given to me. I know to contact the clinic, my physician, or go to the Emergency Department if any problems should occur. I do not have any questions at this time, but understand that I may call the clinic during office hours   should I have any questions or need assistance in obtaining follow up care.    __________________________________________  _____________  __________ Signature of Patient or Authorized Representative            Date                   Time    __________________________________________ Nurse's Signature    

## 2011-06-24 ENCOUNTER — Other Ambulatory Visit: Payer: Self-pay | Admitting: Oncology

## 2011-06-26 ENCOUNTER — Ambulatory Visit (HOSPITAL_BASED_OUTPATIENT_CLINIC_OR_DEPARTMENT_OTHER): Payer: Medicare Other

## 2011-06-26 ENCOUNTER — Telehealth: Payer: Self-pay | Admitting: Oncology

## 2011-06-26 ENCOUNTER — Ambulatory Visit (HOSPITAL_BASED_OUTPATIENT_CLINIC_OR_DEPARTMENT_OTHER): Payer: Medicare Other | Admitting: Oncology

## 2011-06-26 ENCOUNTER — Other Ambulatory Visit (HOSPITAL_BASED_OUTPATIENT_CLINIC_OR_DEPARTMENT_OTHER): Payer: Medicare Other | Admitting: Lab

## 2011-06-26 ENCOUNTER — Telehealth: Payer: Self-pay | Admitting: *Deleted

## 2011-06-26 VITALS — BP 116/76 | HR 70 | Temp 98.5°F

## 2011-06-26 VITALS — BP 118/77 | HR 72 | Temp 97.1°F | Ht 68.0 in | Wt 172.7 lb

## 2011-06-26 DIAGNOSIS — Z5111 Encounter for antineoplastic chemotherapy: Secondary | ICD-10-CM | POA: Diagnosis not present

## 2011-06-26 DIAGNOSIS — Z5112 Encounter for antineoplastic immunotherapy: Secondary | ICD-10-CM | POA: Diagnosis not present

## 2011-06-26 DIAGNOSIS — C8589 Other specified types of non-Hodgkin lymphoma, extranodal and solid organ sites: Secondary | ICD-10-CM | POA: Diagnosis not present

## 2011-06-26 DIAGNOSIS — K625 Hemorrhage of anus and rectum: Secondary | ICD-10-CM

## 2011-06-26 DIAGNOSIS — C8590 Non-Hodgkin lymphoma, unspecified, unspecified site: Secondary | ICD-10-CM

## 2011-06-26 LAB — COMPREHENSIVE METABOLIC PANEL
AST: 35 U/L (ref 0–37)
Alkaline Phosphatase: 97 U/L (ref 39–117)
BUN: 16 mg/dL (ref 6–23)
Creatinine, Ser: 0.81 mg/dL (ref 0.50–1.35)
Potassium: 4.3 mEq/L (ref 3.5–5.3)

## 2011-06-26 LAB — CBC WITH DIFFERENTIAL/PLATELET
BASO%: 1.1 % (ref 0.0–2.0)
EOS%: 6.3 % (ref 0.0–7.0)
HCT: 43 % (ref 38.4–49.9)
LYMPH%: 8.5 % — ABNORMAL LOW (ref 14.0–49.0)
MCH: 31.5 pg (ref 27.2–33.4)
MCHC: 36 g/dL (ref 32.0–36.0)
NEUT%: 58.7 % (ref 39.0–75.0)
RBC: 4.92 10*6/uL (ref 4.20–5.82)
lymph#: 0.3 10*3/uL — ABNORMAL LOW (ref 0.9–3.3)
nRBC: 0 % (ref 0–0)

## 2011-06-26 MED ORDER — DIPHENHYDRAMINE HCL 25 MG PO CAPS
50.0000 mg | ORAL_CAPSULE | Freq: Once | ORAL | Status: AC
  Administered 2011-06-26: 50 mg via ORAL

## 2011-06-26 MED ORDER — ACETAMINOPHEN 325 MG PO TABS
650.0000 mg | ORAL_TABLET | Freq: Once | ORAL | Status: AC
  Administered 2011-06-26: 650 mg via ORAL

## 2011-06-26 MED ORDER — SODIUM CHLORIDE 0.9 % IV SOLN
375.0000 mg/m2 | Freq: Once | INTRAVENOUS | Status: AC
  Administered 2011-06-26: 700 mg via INTRAVENOUS
  Filled 2011-06-26: qty 70

## 2011-06-26 MED ORDER — SODIUM CHLORIDE 0.9 % IV SOLN
70.0000 mg/m2 | Freq: Once | INTRAVENOUS | Status: AC
  Administered 2011-06-26: 135 mg via INTRAVENOUS
  Filled 2011-06-26: qty 27

## 2011-06-26 MED ORDER — DEXAMETHASONE SODIUM PHOSPHATE 10 MG/ML IJ SOLN
10.0000 mg | Freq: Once | INTRAMUSCULAR | Status: AC
  Administered 2011-06-26: 10 mg via INTRAVENOUS

## 2011-06-26 MED ORDER — SODIUM CHLORIDE 0.9 % IV SOLN
Freq: Once | INTRAVENOUS | Status: AC
  Administered 2011-06-26: 50 mL via INTRAVENOUS

## 2011-06-26 MED ORDER — ONDANSETRON 8 MG/50ML IVPB (CHCC)
8.0000 mg | Freq: Once | INTRAVENOUS | Status: AC
  Administered 2011-06-26: 8 mg via INTRAVENOUS

## 2011-06-26 NOTE — Progress Notes (Signed)
Pre rituxan.  Rituxan rate started at 58ml's/hr for 23 ml's = 100 mg/hr

## 2011-06-26 NOTE — Telephone Encounter (Signed)
Per staff message from [place], I have scheduled appts.   JMW

## 2011-06-26 NOTE — Progress Notes (Signed)
Rituxan rate increased to 91mls/hr for 45 mls 200 mg/hr 

## 2011-06-26 NOTE — Telephone Encounter (Signed)
Gave pt appt for July 2013 lab, MD and chemo, PET Scan before MD visit, npo 6 hours before PET

## 2011-06-26 NOTE — Progress Notes (Signed)
   [facility]    OFFICE PROGRESS NOTE   INTERVAL HISTORY:   He completed a second cycle of bendamustine/rituximab beginning on 05/29/2011. He tolerated the chemotherapy well. No nausea, mouth sores, diarrhea, or fever. No recent infection.  He has noted flushing and "hives "after taking niacin. No rectal bleeding.  Objective:  Vital signs in last 24 hours:  Blood pressure 118/77, pulse 72, temperature 97.1 F (36.2 C), temperature source Oral, height 5\' 8"  (1.727 m), weight 172 lb 11.2 oz (78.336 kg).    HEENT: No thrush or ulcers Resp: Lungs clear bilaterally Cardio: Regular rate and rhythm GI: No hepatosplenomegaly Vascular: No leg edema   Lab Results:  Lab Results  Component Value Date   WBC 3.8* 06/26/2011   HGB 15.5 06/26/2011   HCT 43.0 06/26/2011   MCV 87.4 06/26/2011   PLT 138* 06/26/2011   ANC 2.2    Medications: I have reviewed the patient's current medications.  Assessment/Plan: 1. History of intermittent rectal bleeding. 2. 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  a. 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  b. CT of the abdomen and pelvis 08/04/2010 with abdominal/pelvic lymphadenopathy. c. PET scan 09/13/2010 confirming mildly enlarged abdominal/pelvic lymph nodes with borderline increased FDG activity. d. Initiation of single agent rituximab therapy on 11/28/2010, he completed 4 weekly treatments on 12/21/2010 e. 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 f. Cycle 1 bendamustine/rituximab 05/01/2011 g. Cycle 2 bendamustine/rituximab 05/29/2011 3. History of iron  deficiency. Ferritin was in the low normal range on 11/14/2010. Hemoglobin remains normal. 4. ? left upper lung nodule on a chest x-ray 08/16/2010-no hypermetabolic lung nodule was seen on a PET scan 09/13/2010   Disposition:  He appears stable. He will complete a third cycle of bendamustine/rituximab beginning today. He will return for an office visit after a restaging PET scan in one month.   Thornton Papas, MD  06/26/2011  8:44 PM

## 2011-06-26 NOTE — Progress Notes (Signed)
Rituxan rate increased to 300 mg/hr = 136cc/hr x 68 cc.  No s/s of Reaction.

## 2011-06-26 NOTE — Progress Notes (Signed)
No s/s of Reaction.  Rituxan continued at 400 mg/hr = 182cc/hr

## 2011-06-26 NOTE — Progress Notes (Signed)
No s/s of Reaction. Rituxan increased to 400 mg/hr = 182cc/hr x 91cc

## 2011-06-26 NOTE — Patient Instructions (Addendum)
[facility] Discharge Instructions for Patients Receiving Chemotherapy  Today you received the following chemotherapy agents ; Rituxan and Treanda.    If you develop nausea and vomiting that is not controlled by your nausea medication, call the clinic. If it is after clinic hours your family physician or the after hours number for the clinic or go to the Emergency Department.   BELOW ARE SYMPTOMS THAT SHOULD BE REPORTED IMMEDIATELY:  *FEVER GREATER THAN 100.5 F  *CHILLS WITH OR WITHOUT FEVER  NAUSEA AND VOMITING THAT IS NOT CONTROLLED WITH YOUR NAUSEA MEDICATION  *UNUSUAL SHORTNESS OF BREATH  *UNUSUAL BRUISING OR BLEEDING  TENDERNESS IN MOUTH AND THROAT WITH OR WITHOUT PRESENCE OF ULCERS  *URINARY PROBLEMS  *BOWEL PROBLEMS  UNUSUAL RASH Items with * indicate a potential emergency and should be followed up as soon as possible.  One of the nurses will contact you 24 hours after your treatment. Please let the nurse know about any problems that you may have experienced. Feel free to call the clinic you have any questions or concerns. The clinic phone number is [phone].   I have been informed and understand all the instructions given to me. I know to contact the clinic, my physician, or go to the Emergency Department if any problems should occur. I do not have any questions at this time, but understand that I may call the clinic during office hours   should I have any questions or need assistance in obtaining follow up care.    __________________________________________  _____________  __________ Signature of Patient or Authorized Representative            Date                   Time    __________________________________________ Nurse's Signature

## 2011-06-27 ENCOUNTER — Ambulatory Visit (HOSPITAL_BASED_OUTPATIENT_CLINIC_OR_DEPARTMENT_OTHER): Payer: Medicare Other

## 2011-06-27 VITALS — BP 152/88 | HR 75 | Temp 97.6°F

## 2011-06-27 DIAGNOSIS — Z5111 Encounter for antineoplastic chemotherapy: Secondary | ICD-10-CM | POA: Diagnosis not present

## 2011-06-27 DIAGNOSIS — C8583 Other specified types of non-Hodgkin lymphoma, intra-abdominal lymph nodes: Secondary | ICD-10-CM | POA: Diagnosis not present

## 2011-06-27 DIAGNOSIS — C8590 Non-Hodgkin lymphoma, unspecified, unspecified site: Secondary | ICD-10-CM

## 2011-06-27 MED ORDER — ONDANSETRON 8 MG/50ML IVPB (CHCC)
8.0000 mg | Freq: Once | INTRAVENOUS | Status: AC
  Administered 2011-06-27: 8 mg via INTRAVENOUS

## 2011-06-27 MED ORDER — DEXAMETHASONE SODIUM PHOSPHATE 10 MG/ML IJ SOLN
10.0000 mg | Freq: Once | INTRAMUSCULAR | Status: AC
  Administered 2011-06-27: 10 mg via INTRAVENOUS

## 2011-06-27 MED ORDER — SODIUM CHLORIDE 0.9 % IV SOLN
Freq: Once | INTRAVENOUS | Status: AC
  Administered 2011-06-27: 15:00:00 via INTRAVENOUS

## 2011-06-27 MED ORDER — SODIUM CHLORIDE 0.9 % IV SOLN
70.0000 mg/m2 | Freq: Once | INTRAVENOUS | Status: AC
  Administered 2011-06-27: 135 mg via INTRAVENOUS
  Filled 2011-06-27: qty 27

## 2011-06-27 NOTE — Patient Instructions (Signed)
[facility] Discharge Instructions for Patients Receiving Chemotherapy  Today you received the following chemotherapy agents Treanda To help prevent nausea and vomiting after your treatment, we encourage you to take your nausea medication as prescribed.If you develop nausea and vomiting that is not controlled by your nausea medication, call the clinic. If it is after clinic hours your family physician or the after hours number for the clinic or go to the Emergency Department.   BELOW ARE SYMPTOMS THAT SHOULD BE REPORTED IMMEDIATELY:  *FEVER GREATER THAN 100.5 F  *CHILLS WITH OR WITHOUT FEVER  NAUSEA AND VOMITING THAT IS NOT CONTROLLED WITH YOUR NAUSEA MEDICATION  *UNUSUAL SHORTNESS OF BREATH  *UNUSUAL BRUISING OR BLEEDING  TENDERNESS IN MOUTH AND THROAT WITH OR WITHOUT PRESENCE OF ULCERS  *URINARY PROBLEMS  *BOWEL PROBLEMS  UNUSUAL RASH Items with * indicate a potential emergency and should be followed up as soon as possible.  One of the nurses will contact you 24 hours after your treatment. Please let the nurse know about any problems that you may have experienced. Feel free to call the clinic you have any questions or concerns. The clinic phone number is [phone].   I have been informed and understand all the instructions given to me. I know to contact the clinic, my physician, or go to the Emergency Department if any problems should occur. I do not have any questions at this time, but understand that I may call the clinic during office hours   should I have any questions or need assistance in obtaining follow up care.    __________________________________________  _____________  __________ Signature of Patient or Authorized Representative            Date                   Time    __________________________________________ Nurse's Signature    

## 2011-07-02 ENCOUNTER — Telehealth: Payer: Self-pay | Admitting: Oncology

## 2011-07-02 NOTE — Telephone Encounter (Signed)
Called pt and left message regarding chemo in July 2013

## 2011-07-26 ENCOUNTER — Other Ambulatory Visit (HOSPITAL_COMMUNITY): Payer: Medicare Other

## 2011-07-27 ENCOUNTER — Encounter (HOSPITAL_COMMUNITY)
Admission: RE | Admit: 2011-07-27 | Discharge: 2011-07-27 | Disposition: A | Discharge status: Home / Self Care | Payer: Medicare Other | Source: Ambulatory Visit | Attending: Oncology | Admitting: Oncology

## 2011-07-27 DIAGNOSIS — C8590 Non-Hodgkin lymphoma, unspecified, unspecified site: Secondary | ICD-10-CM

## 2011-07-27 DIAGNOSIS — C8589 Other specified types of non-Hodgkin lymphoma, extranodal and solid organ sites: Secondary | ICD-10-CM | POA: Insufficient documentation

## 2011-07-27 DIAGNOSIS — Z9221 Personal history of antineoplastic chemotherapy: Secondary | ICD-10-CM | POA: Diagnosis not present

## 2011-07-27 MED ORDER — FLUDEOXYGLUCOSE F - 18 (FDG) INJECTION
14.9000 | Freq: Once | INTRAVENOUS | Status: AC | PRN
  Administered 2011-07-27: 14.9 via INTRAVENOUS

## 2011-07-28 ENCOUNTER — Other Ambulatory Visit: Payer: Self-pay | Admitting: Oncology

## 2011-07-31 ENCOUNTER — Telehealth: Payer: Self-pay | Admitting: Oncology

## 2011-07-31 ENCOUNTER — Ambulatory Visit (HOSPITAL_BASED_OUTPATIENT_CLINIC_OR_DEPARTMENT_OTHER): Payer: Medicare Other

## 2011-07-31 ENCOUNTER — Other Ambulatory Visit (HOSPITAL_BASED_OUTPATIENT_CLINIC_OR_DEPARTMENT_OTHER): Payer: Medicare Other | Admitting: Lab

## 2011-07-31 ENCOUNTER — Ambulatory Visit (HOSPITAL_BASED_OUTPATIENT_CLINIC_OR_DEPARTMENT_OTHER): Payer: Medicare Other | Admitting: Oncology

## 2011-07-31 VITALS — BP 107/69 | HR 69 | Temp 98.3°F

## 2011-07-31 VITALS — BP 120/77 | HR 72 | Temp 97.9°F | Ht 68.0 in | Wt 171.3 lb

## 2011-07-31 DIAGNOSIS — C8590 Non-Hodgkin lymphoma, unspecified, unspecified site: Secondary | ICD-10-CM

## 2011-07-31 DIAGNOSIS — R911 Solitary pulmonary nodule: Secondary | ICD-10-CM

## 2011-07-31 DIAGNOSIS — C8583 Other specified types of non-Hodgkin lymphoma, intra-abdominal lymph nodes: Secondary | ICD-10-CM

## 2011-07-31 DIAGNOSIS — Z5112 Encounter for antineoplastic immunotherapy: Secondary | ICD-10-CM | POA: Diagnosis not present

## 2011-07-31 DIAGNOSIS — C8589 Other specified types of non-Hodgkin lymphoma, extranodal and solid organ sites: Secondary | ICD-10-CM | POA: Diagnosis not present

## 2011-07-31 LAB — COMPREHENSIVE METABOLIC PANEL
ALT: 76 U/L — ABNORMAL HIGH (ref 0–53)
Alkaline Phosphatase: 100 U/L (ref 39–117)
CO2: 22 mEq/L (ref 19–32)
Creatinine, Ser: 0.78 mg/dL (ref 0.50–1.35)
Sodium: 136 mEq/L (ref 135–145)
Total Bilirubin: 0.7 mg/dL (ref 0.3–1.2)
Total Protein: 6.9 g/dL (ref 6.0–8.3)

## 2011-07-31 LAB — CBC WITH DIFFERENTIAL/PLATELET
BASO%: 0.9 % (ref 0.0–2.0)
EOS%: 4.6 % (ref 0.0–7.0)
Eosinophils Absolute: 0.2 10*3/uL (ref 0.0–0.5)
LYMPH%: 24.8 % (ref 14.0–49.0)
MCH: 31.5 pg (ref 27.2–33.4)
MCHC: 35.8 g/dL (ref 32.0–36.0)
MCV: 88.1 fL (ref 79.3–98.0)
MONO%: 17.8 % — ABNORMAL HIGH (ref 0.0–14.0)
NEUT#: 2.4 10*3/uL (ref 1.5–6.5)
Platelets: 143 10*3/uL (ref 140–400)
RBC: 4.44 10*6/uL (ref 4.20–5.82)
RDW: 13.5 % (ref 11.0–14.6)
nRBC: 0 % (ref 0–0)

## 2011-07-31 MED ORDER — DIPHENHYDRAMINE HCL 25 MG PO CAPS
50.0000 mg | ORAL_CAPSULE | Freq: Once | ORAL | Status: AC
  Administered 2011-07-31: 50 mg via ORAL

## 2011-07-31 MED ORDER — SODIUM CHLORIDE 0.9 % IV SOLN
Freq: Once | INTRAVENOUS | Status: AC
  Administered 2011-07-31: 12:00:00 via INTRAVENOUS

## 2011-07-31 MED ORDER — SODIUM CHLORIDE 0.9 % IV SOLN
375.0000 mg/m2 | Freq: Once | INTRAVENOUS | Status: AC
  Administered 2011-07-31: 700 mg via INTRAVENOUS
  Filled 2011-07-31: qty 70

## 2011-07-31 MED ORDER — ACETAMINOPHEN 325 MG PO TABS
650.0000 mg | ORAL_TABLET | Freq: Once | ORAL | Status: AC
  Administered 2011-07-31: 650 mg via ORAL

## 2011-07-31 MED ORDER — ONDANSETRON 8 MG/50ML IVPB (CHCC)
8.0000 mg | Freq: Once | INTRAVENOUS | Status: AC
  Administered 2011-07-31: 8 mg via INTRAVENOUS

## 2011-07-31 MED ORDER — DEXAMETHASONE SODIUM PHOSPHATE 10 MG/ML IJ SOLN
10.0000 mg | Freq: Once | INTRAMUSCULAR | Status: AC
  Administered 2011-07-31: 10 mg via INTRAVENOUS

## 2011-07-31 MED ORDER — SODIUM CHLORIDE 0.9 % IV SOLN
70.0000 mg/m2 | Freq: Once | INTRAVENOUS | Status: AC
  Administered 2011-07-31: 135 mg via INTRAVENOUS
  Filled 2011-07-31: qty 27

## 2011-07-31 NOTE — Progress Notes (Signed)
   [facility]    OFFICE PROGRESS NOTE   INTERVAL HISTORY:   He completed another cycle of bendamustine/rituximab on 06/26/2011. He tolerated the chemotherapy well. No nausea, mouth sores, diarrhea, or fever. No further rectal bleeding. No complaint.  Objective:  Vital signs in last 24 hours:  Blood pressure 120/77, pulse 72, temperature 97.9 F (36.6 C), temperature source Oral, height 5\' 8"  (1.727 m), weight 171 lb 4.8 oz (77.701 kg).    HEENT: No thrush or ulcers Lymphatics: No cervical, supraclavicular, or inguinal nodes.? 1/2-1cm soft mobile high right axillary node versus a prominent fat pad. Resp: Lungs clear bilaterally Cardio: Regular rate and rhythm GI: No hepatosplenomegaly Vascular: No leg edema  Lab Results:  Lab Results  Component Value Date   WBC 4.6 07/31/2011   HGB 14.0 07/31/2011   HCT 39.1 07/31/2011   MCV 88.1 07/31/2011   PLT 143 07/31/2011   ANC 2.4  X-rays: PET scan on 07/27/2011-no evidence of residual or recurrent lymphoma. No abnormal metabolic activity.  Medications: I have reviewed the patient's current medications.  Assessment/Plan: 1. History of intermittent rectal bleeding. 2. 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  a. 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  b. CT of the abdomen and pelvis 08/04/2010 with abdominal/pelvic lymphadenopathy. c. PET scan 09/13/2010 confirming mildly enlarged abdominal/pelvic lymph nodes with borderline increased FDG activity. d. Initiation of single agent rituximab therapy on 11/28/2010, he completed 4 weekly treatments on 12/21/2010 e. 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 f. Cycle 1 bendamustine/rituximab 05/01/2011 g. Cycle 2 bendamustine/rituximab 05/29/2011 h. Cycle 3 bendamustine/rituximab 06/26/2011 i. Restaging PET scan 07/27/2011-no evidence of lymphoma 3. History of iron deficiency. Ferritin was in the low normal range on 11/14/2010. Hemoglobin remains normal. 4. ? left upper lung nodule on a chest x-ray 08/16/2010-no hypermetabolic lung nodule was seen on a PET scan 09/13/2010   Disposition:  He appears stable. He has completed 3 cycles of bendamustine/rituximab. He has tolerated the therapy well and the restaging PET scan reveals no evidence of lymphoma. The rectal bleeding has resolved. He will complete cycle 4 of bendamustine/rituximab today. We will discontinue systemic therapy after cycle 4.  We will refer him to Dr. Jarold Motto for a restaging sigmoid colonoscopy within the next one to 2 months.  I discussed the indication for maintenance rituximab therapy with the patient and his wife. He did not have significant improvement in the lymphoma with single agent rituximab therapy. We decided against maintenance rituximab.  Mr. Cornfield will return for an office visit in 3 months.   Thornton Papas, MD  07/31/2011  11:48 AM

## 2011-07-31 NOTE — Telephone Encounter (Signed)
appts made and printed for pt aom °

## 2011-07-31 NOTE — Patient Instructions (Signed)
[facility] Discharge Instructions for Patients Receiving Chemotherapy  Today you received the following chemotherapy agents Rituxan and Treanda  To help prevent nausea and vomiting after your treatment, we encourage you to take your nausea medication.   If you develop nausea and vomiting that is not controlled by your nausea medication, call the clinic. If it is after clinic hours your family physician or the after hours number for the clinic or go to the Emergency Department.   BELOW ARE SYMPTOMS THAT SHOULD BE REPORTED IMMEDIATELY:  *FEVER GREATER THAN 100.5 F  *CHILLS WITH OR WITHOUT FEVER  NAUSEA AND VOMITING THAT IS NOT CONTROLLED WITH YOUR NAUSEA MEDICATION  *UNUSUAL SHORTNESS OF BREATH  *UNUSUAL BRUISING OR BLEEDING  TENDERNESS IN MOUTH AND THROAT WITH OR WITHOUT PRESENCE OF ULCERS  *URINARY PROBLEMS  *BOWEL PROBLEMS  UNUSUAL RASH Items with * indicate a potential emergency and should be followed up as soon as possible.  One of the nurses will contact you 24 hours after your treatment. Please let the nurse know about any problems that you may have experienced. Feel free to call the clinic you have any questions or concerns. The clinic phone number is [phone].   I have been informed and understand all the instructions given to me. I know to contact the clinic, my physician, or go to the Emergency Department if any problems should occur. I do not have any questions at this time, but understand that I may call the clinic during office hours   should I have any questions or need assistance in obtaining follow up care.    __________________________________________  _____________  __________ Signature of Patient or Authorized Representative            Date                   Time    __________________________________________ Nurse's Signature    

## 2011-08-01 ENCOUNTER — Ambulatory Visit (HOSPITAL_BASED_OUTPATIENT_CLINIC_OR_DEPARTMENT_OTHER): Payer: Medicare Other

## 2011-08-01 ENCOUNTER — Telehealth: Payer: Self-pay | Admitting: Oncology

## 2011-08-01 VITALS — BP 157/85 | HR 83 | Temp 97.7°F

## 2011-08-01 DIAGNOSIS — Z5111 Encounter for antineoplastic chemotherapy: Secondary | ICD-10-CM | POA: Diagnosis not present

## 2011-08-01 DIAGNOSIS — C8583 Other specified types of non-Hodgkin lymphoma, intra-abdominal lymph nodes: Secondary | ICD-10-CM | POA: Diagnosis not present

## 2011-08-01 DIAGNOSIS — C8590 Non-Hodgkin lymphoma, unspecified, unspecified site: Secondary | ICD-10-CM

## 2011-08-01 MED ORDER — SODIUM CHLORIDE 0.9 % IV SOLN
Freq: Once | INTRAVENOUS | Status: AC
  Administered 2011-08-01: 09:00:00 via INTRAVENOUS

## 2011-08-01 MED ORDER — SODIUM CHLORIDE 0.9 % IV SOLN
70.0000 mg/m2 | Freq: Once | INTRAVENOUS | Status: AC
  Administered 2011-08-01: 135 mg via INTRAVENOUS
  Filled 2011-08-01: qty 27

## 2011-08-01 MED ORDER — DEXAMETHASONE SODIUM PHOSPHATE 10 MG/ML IJ SOLN
10.0000 mg | Freq: Once | INTRAMUSCULAR | Status: AC
  Administered 2011-08-01: 10 mg via INTRAVENOUS

## 2011-08-01 MED ORDER — ONDANSETRON 8 MG/50ML IVPB (CHCC)
8.0000 mg | Freq: Once | INTRAVENOUS | Status: AC
  Administered 2011-08-01: 8 mg via INTRAVENOUS

## 2011-08-01 NOTE — Telephone Encounter (Signed)
Pt came in and changed lab from 8/15 to 8/9 as he is going out of town  aom

## 2011-08-01 NOTE — Patient Instructions (Signed)
[facility] Discharge Instructions for Patients Receiving Chemotherapy  Today you received the following chemotherapy agents Treanda To help prevent nausea and vomiting after your treatment, we encourage you to take your nausea medication as prescribed.If you develop nausea and vomiting that is not controlled by your nausea medication, call the clinic. If it is after clinic hours your family physician or the after hours number for the clinic or go to the Emergency Department.   BELOW ARE SYMPTOMS THAT SHOULD BE REPORTED IMMEDIATELY:  *FEVER GREATER THAN 100.5 F  *CHILLS WITH OR WITHOUT FEVER  NAUSEA AND VOMITING THAT IS NOT CONTROLLED WITH YOUR NAUSEA MEDICATION  *UNUSUAL SHORTNESS OF BREATH  *UNUSUAL BRUISING OR BLEEDING  TENDERNESS IN MOUTH AND THROAT WITH OR WITHOUT PRESENCE OF ULCERS  *URINARY PROBLEMS  *BOWEL PROBLEMS  UNUSUAL RASH Items with * indicate a potential emergency and should be followed up as soon as possible.  One of the nurses will contact you 24 hours after your treatment. Please let the nurse know about any problems that you may have experienced. Feel free to call the clinic you have any questions or concerns. The clinic phone number is [phone].   I have been informed and understand all the instructions given to me. I know to contact the clinic, my physician, or go to the Emergency Department if any problems should occur. I do not have any questions at this time, but understand that I may call the clinic during office hours   should I have any questions or need assistance in obtaining follow up care.    __________________________________________  _____________  __________ Signature of Patient or Authorized Representative            Date                   Time    __________________________________________ Nurse's Signature    

## 2011-08-13 DIAGNOSIS — H02409 Unspecified ptosis of unspecified eyelid: Secondary | ICD-10-CM | POA: Diagnosis not present

## 2011-08-24 ENCOUNTER — Other Ambulatory Visit (HOSPITAL_BASED_OUTPATIENT_CLINIC_OR_DEPARTMENT_OTHER): Payer: Medicare Other | Admitting: Lab

## 2011-08-24 ENCOUNTER — Telehealth: Payer: Self-pay | Admitting: *Deleted

## 2011-08-24 DIAGNOSIS — C8590 Non-Hodgkin lymphoma, unspecified, unspecified site: Secondary | ICD-10-CM

## 2011-08-24 DIAGNOSIS — C8589 Other specified types of non-Hodgkin lymphoma, extranodal and solid organ sites: Secondary | ICD-10-CM | POA: Diagnosis not present

## 2011-08-24 LAB — CBC WITH DIFFERENTIAL/PLATELET
BASO%: 0.9 % (ref 0.0–2.0)
Eosinophils Absolute: 0.2 10*3/uL (ref 0.0–0.5)
HCT: 38.8 % (ref 38.4–49.9)
LYMPH%: 24.6 % (ref 14.0–49.0)
MCHC: 35.8 g/dL (ref 32.0–36.0)
MCV: 89 fL (ref 79.3–98.0)
MONO#: 0.7 10*3/uL (ref 0.1–0.9)
MONO%: 20 % — ABNORMAL HIGH (ref 0.0–14.0)
NEUT%: 49 % (ref 39.0–75.0)
Platelets: 123 10*3/uL — ABNORMAL LOW (ref 140–400)
WBC: 3.3 10*3/uL — ABNORMAL LOW (ref 4.0–10.3)

## 2011-08-24 NOTE — Telephone Encounter (Signed)
Call from pt, he received a copy of his CBC and has questions. Informed him mildly decreased platelets and WBC were likely due to treatment. No cause for concern. Counts should recover without intervention. He voiced understanding.

## 2011-08-30 ENCOUNTER — Other Ambulatory Visit: Payer: Medicare Other | Admitting: Lab

## 2011-08-30 ENCOUNTER — Encounter: Payer: Self-pay | Admitting: Gastroenterology

## 2011-08-30 ENCOUNTER — Telehealth: Payer: Self-pay | Admitting: Oncology

## 2011-08-30 NOTE — Telephone Encounter (Signed)
called pt with dr Jarold Motto appt info

## 2011-09-25 ENCOUNTER — Encounter: Payer: Self-pay | Admitting: Gastroenterology

## 2011-09-25 ENCOUNTER — Ambulatory Visit (INDEPENDENT_AMBULATORY_CARE_PROVIDER_SITE_OTHER): Payer: Medicare Other | Admitting: Gastroenterology

## 2011-09-25 VITALS — BP 120/70 | HR 68 | Ht 68.0 in | Wt 176.0 lb

## 2011-09-25 DIAGNOSIS — C8593 Non-Hodgkin lymphoma, unspecified, intra-abdominal lymph nodes: Secondary | ICD-10-CM

## 2011-09-25 DIAGNOSIS — C8583 Other specified types of non-Hodgkin lymphoma, intra-abdominal lymph nodes: Secondary | ICD-10-CM | POA: Diagnosis not present

## 2011-09-25 NOTE — Progress Notes (Signed)
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 He currently is in remission after having completed his second round of chemotherapy. He currently denies rectal bleeding, abdominal pain, but has occasional loose stool. Overall he feels well and denies nay systemic complaints. He is followed closely by Dr. Mancel Bale in oncology, and his notes are reviewed today as are his lab tests. Last flexible sigmoidoscopy exam was in February. PET scan in July showed no lymphoma activity.  Current Medications, Allergies, Past Medical History, Past Surgical History, Family History and Social History were reviewed in [employer] record.  Pertinent Review of Systems Negative   Physical Exam: Blood pressure 120/70, pulse 60 and regular, and weight 176 pounds with BMI of 26.76. Patient appears healthy and in no acute distress. There is no hepatosplenomegaly, abdominal masses or tenderness noted. Bowel sounds are normal.   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 We have scheduled repeat flexible sigmoidoscopy as recommended by oncology. His continue his other medications as listed and reviewed. Encounter Diagnosis  Name Primary?  . Intestinal lymphoma Yes

## 2011-09-25 NOTE — Patient Instructions (Addendum)
You have been scheduled for a flexible sigmoidoscopy Separate instructions given  Flexible Sigmoidoscopy Your caregiver has ordered a flexible sigmoidoscopy. This is an exam to evaluate your lower colon. In this exam your colon is cleansed and a short fiber optic tube is inserted through your rectum and into your colon. The fiber optic scope (endoscope) is a short bundle of enclosed flexible small glass fibers. It transmits light to the area examined and images from that area to your caregiver. You do not have to worry about glass breakage in the endoscope. Discomfort is usually minimal. Sedatives and pain medications are generally not required. This exam helps to detect tumors (lumps), polyps, inflammation (swelling and soreness), and areas of bleeding. It may also be used to take biopsies. These are small pieces of tissue taken to examine under a microscope. LET YOUR CAREGIVER KNOW ABOUT:  Allergies.   Medications taken including herbs, eye drops, over the counter medications, and creams.   Use of steroids (by mouth or creams).   Previous problems with anesthetics or novocaine   Possibility of pregnancy, if this applies.   History of blood clots (thrombophlebitis).   History of bleeding or blood problems.   Previous surgery.   Other health problems.  BEFORE THE PROCEDURE Eat normally the night before the exam. Your caregiver may order a mild enema or laxative the night before. No eating or drinking should occur after midnight until the procedure is completed. A rectal suppository or enemas may be given in the morning prior to your procedure. You will be brought to the examination area in a hospital gown. You should be present 60 minutes prior to your procedure or as directed.  AFTER THE PROCEDURE   There is sometimes a little blood passed with the first bowel movement. Do not be concerned. Because air is often used during the exam, it is not unusual to pass gas and experience abdominal  (belly) cramping. Walking or a warm pack on your abdomen may help with this. Do not sleep with a heating pad as burns can occur.   You may resume all normal eating and activities.   Only take over-the-counter or prescription medicines for pain, discomfort, or fever as directed by your caregiver. Do not use aspirin or blood thinners if a biopsy (tissue sample) was taken. Consult your caregiver for medication usage if biopsies were taken.   Call for your results as instructed by your caregiver. Remember, it is your responsibility to obtain the results of your biopsy. Do not assume everything is fine because you do not hear from your caregiver.  SEEK IMMEDIATE MEDICAL CARE IF:  An oral temperature above 102 F (38.9 C) develops.   You pass large blood clots or fill a toilet with blood following the procedure. This may also occur 10 to 14 days following the procedure. It is more likely if a biopsy was taken.   You develop abdominal pain not relieved with medication or that is getting worse rather than better.  Document Released: 12/30/1999 Document Revised: 12/21/2010 Document Reviewed: 10/11/2004 [facility] Patient Information 2012 [place], [place].

## 2011-10-03 ENCOUNTER — Other Ambulatory Visit: Payer: Medicare Other | Admitting: Gastroenterology

## 2011-10-03 ENCOUNTER — Encounter: Payer: Self-pay | Admitting: Gastroenterology

## 2011-10-03 ENCOUNTER — Ambulatory Visit (AMBULATORY_SURGERY_CENTER): Payer: Medicare Other | Admitting: Gastroenterology

## 2011-10-03 VITALS — BP 134/82 | HR 78 | Temp 97.3°F | Resp 18 | Ht 68.0 in | Wt 176.0 lb

## 2011-10-03 DIAGNOSIS — C8593 Non-Hodgkin lymphoma, unspecified, intra-abdominal lymph nodes: Secondary | ICD-10-CM

## 2011-10-03 DIAGNOSIS — C8583 Other specified types of non-Hodgkin lymphoma, intra-abdominal lymph nodes: Secondary | ICD-10-CM | POA: Diagnosis not present

## 2011-10-03 DIAGNOSIS — R933 Abnormal findings on diagnostic imaging of other parts of digestive tract: Secondary | ICD-10-CM | POA: Diagnosis not present

## 2011-10-03 MED ORDER — SODIUM CHLORIDE 0.9 % IV SOLN: 500.0000 mL | INTRAVENOUS | Status: DC

## 2011-10-03 NOTE — Progress Notes (Signed)
Patient did not experience any of the following events: a burn prior to discharge; a fall within the facility; wrong site/side/patient/procedure/implant event; or a hospital transfer or hospital admission upon discharge from the facility. (G8907) Patient did not have preoperative order for IV antibiotic SSI prophylaxis. (G8918)  

## 2011-10-03 NOTE — Patient Instructions (Addendum)
You may resume your current medications today.  Please call if any questions or concerns.    YOU HAD AN ENDOSCOPIC PROCEDURE TODAY AT THE [facility]: Refer to the procedure report that was given to you for any specific questions about what was found during the examination.  If the procedure report does not answer your questions, please call your gastroenterologist to clarify.  If you requested that your care partner not be given the details of your procedure findings, then the procedure report has been included in a sealed envelope for you to review at your convenience later.  YOU SHOULD EXPECT: Some feelings of bloating in the abdomen. Passage of more gas than usual.  Walking can help get rid of the air that was put into your GI tract during the procedure and reduce the bloating. If you had a lower endoscopy (such as a colonoscopy or flexible sigmoidoscopy) you may notice spotting of blood in your stool or on the toilet paper. If you underwent a bowel prep for your procedure, then you may not have a normal bowel movement for a few days.  DIET: Your first meal following the procedure should be a light meal and then it is ok to progress to your normal diet.  A half-sandwich or bowl of soup is an example of a good first meal.  Heavy or fried foods are harder to digest and may make you feel nauseous or bloated.  Likewise meals heavy in dairy and vegetables can cause extra gas to form and this can also increase the bloating.  Drink plenty of fluids but you should avoid alcoholic beverages for 24 hours.  ACTIVITY: Your care partner should take you home directly after the procedure.  You should plan to take it easy, moving slowly for the rest of the day.  You can resume normal activity the day after the procedure however you should NOT DRIVE or use heavy machinery for 24 hours (because of the sedation medicines used during the test).    SYMPTOMS TO REPORT IMMEDIATELY: A gastroenterologist can be  reached at any hour.  During normal business hours, 8:30 AM to 5:00 PM Monday through Friday, call [phone].  After hours and on weekends, please call the GI answering service at [phone] who will take a message and have the physician on call contact you.   Following lower endoscopy (colonoscopy or flexible sigmoidoscopy):  Excessive amounts of blood in the stool  Significant tenderness or worsening of abdominal pains  Swelling of the abdomen that is new, acute  Fever of 100F or higher   FOLLOW UP: If any biopsies were taken you will be contacted by phone or by letter within the next 1-3 weeks.  Call your gastroenterologist if you have not heard about the biopsies in 3 weeks.  Our staff will call the home number listed on your records the next business day following your procedure to check on you and address any questions or concerns that you may have at that time regarding the information given to you following your procedure. This is a courtesy call and so if there is no answer at the home number and we have not heard from you through the emergency physician on call, we will assume that you have returned to your regular daily activities without incident.  SIGNATURES/CONFIDENTIALITY: You and/or your care partner have signed paperwork which will be entered into your electronic medical record.  These signatures attest to the fact that that the information above   on your After Visit Summary has been reviewed and is understood.  Full responsibility of the confidentiality of this discharge information lies with you and/or your care-partner. 

## 2011-10-03 NOTE — Progress Notes (Signed)
No complaints noted in the recovery room. Maw   

## 2011-10-03 NOTE — Op Note (Signed)
[facility] [address] [place], [address]   FLEXIBLE SIGMOIDOSCOPY PROCEDURE REPORT  PATIENT: Brad, May  MR#: [id number] BIRTHDATE: 07-17-1940 , 70  yrs. old GENDER: Male ENDOSCOPIST: Mardella Layman, MD, Clementeen Graham REFERRED BY: Mardelle Matte, M.D.  Rudi Heap, M.D. PROCEDURE DATE:  10/03/2011 PROCEDURE:   Sigmoidoscopy, diagnostic ASA CLASS:   Class II INDICATIONS:f/u intestinal lymphoma after chemoRx.Marland Kitchen MEDICATIONS: Propofol (Diprivan) 60 mg IV  DESCRIPTION OF PROCEDURE:   After the risks benefits and alternatives of the procedure were thoroughly explained, informed consent was obtained.  revealed no abnormalities of the rectum. The [id number]  endoscope was introduced through the anus and advanced to the splenic flexure , limited by No adverse events experienced.   The quality of the prep was excellent .  The instrument was then slowly withdrawn as the mucosa was fully examined.       COLON FINDINGS: The colonic mucosa appeared normal. Scattered diverticulae noted.No mass lesions or erosions noted. Retroflexed views revealed no abnormalities.    The scope was then withdrawn from the patient and the procedure terminated.  COMPLICATIONS: There were no complications.  ENDOSCOPIC IMPRESSION:NO EVIDENCE OF RECURRENT DISEASE.... The colonic mucosa appeared normal  RECOMMENDATIONS:ONCOLOGY F/U AS PLANNED.Marland Kitchen   REPEAT EXAM:   _______________________________ eSignedMardella Layman, MD, [facility] 10/03/2011 2:43 PM   CC:

## 2011-10-04 ENCOUNTER — Telehealth: Payer: Self-pay

## 2011-10-04 NOTE — Telephone Encounter (Signed)
Left message

## 2011-10-30 ENCOUNTER — Telehealth: Payer: Self-pay | Admitting: Oncology

## 2011-10-30 ENCOUNTER — Other Ambulatory Visit (HOSPITAL_BASED_OUTPATIENT_CLINIC_OR_DEPARTMENT_OTHER): Payer: Medicare Other | Admitting: Lab

## 2011-10-30 ENCOUNTER — Ambulatory Visit (HOSPITAL_BASED_OUTPATIENT_CLINIC_OR_DEPARTMENT_OTHER): Payer: Medicare Other | Admitting: Oncology

## 2011-10-30 VITALS — BP 118/75 | HR 71 | Temp 96.8°F | Resp 20 | Ht 68.0 in | Wt 172.3 lb

## 2011-10-30 DIAGNOSIS — C8588 Other specified types of non-Hodgkin lymphoma, lymph nodes of multiple sites: Secondary | ICD-10-CM | POA: Diagnosis not present

## 2011-10-30 DIAGNOSIS — C8590 Non-Hodgkin lymphoma, unspecified, unspecified site: Secondary | ICD-10-CM

## 2011-10-30 LAB — CBC WITH DIFFERENTIAL/PLATELET
BASO%: 0.7 % (ref 0.0–2.0)
Basophils Absolute: 0 10*3/uL (ref 0.0–0.1)
EOS%: 3.8 % (ref 0.0–7.0)
HCT: 42.9 % (ref 38.4–49.9)
HGB: 15.2 g/dL (ref 13.0–17.1)
MCH: 32.6 pg (ref 27.2–33.4)
MCHC: 35.4 g/dL (ref 32.0–36.0)
MCV: 92.1 fL (ref 79.3–98.0)
MONO%: 14.8 % — ABNORMAL HIGH (ref 0.0–14.0)
NEUT%: 56.5 % (ref 39.0–75.0)

## 2011-10-30 NOTE — Telephone Encounter (Signed)
appts made and printed for pt aom °

## 2011-10-30 NOTE — Progress Notes (Signed)
   [facility]    OFFICE PROGRESS NOTE   INTERVAL HISTORY:   He returns as scheduled. No rectal bleeding. No fever, night sweats, or anorexia. No new complaint. He underwent a restaging sigmoidoscopy by Dr. Jarold Motto on 10/03/2011. No evidence of lymphoma, the colonic mucosa appeared normal.  Objective:  Vital signs in last 24 hours:  Blood pressure 118/75, pulse 71, temperature 96.8 F (36 C), temperature source Oral, resp. rate 20, height 5\' 8"  (1.727 m), weight 172 lb 4.8 oz (78.155 kg).    HEENT: Oropharynx without visible mass, neck without mass Lymphatics: No cervical, supra-clavicular, axillary, or inguinal nodes Resp: end inspiratory rales at the upper posterior chest bilaterally, no respiratory distress Cardio: Regular rate and rhythm GI: No hepatosplenomegaly, nontender, no mass Vascular: No leg edema   Lab Results:  Lab Results  Component Value Date   WBC 5.5 10/30/2011   HGB 15.2 10/30/2011   HCT 42.9 10/30/2011   MCV 92.1 10/30/2011   PLT 146 10/30/2011   ANC 3.1    Medications: I have reviewed the patient's current medications.  Assessment/Plan: 1. History of intermittent rectal bleeding. 2. 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  a. 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  b. CT of the abdomen and pelvis 08/04/2010 with abdominal/pelvic lymphadenopathy. c. PET scan 09/13/2010 confirming mildly enlarged abdominal/pelvic lymph nodes with borderline increased FDG activity. d. Initiation of single agent rituximab therapy on 11/28/2010, he completed 4 weekly treatments on 12/21/2010 e. 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 f. Cycle 1 bendamustine/rituximab 05/01/2011 g. Cycle 2 bendamustine/rituximab 05/29/2011 h. Cycle 3 bendamustine/rituximab 06/26/2011 i. Restaging PET scan 07/27/2011-no evidence of lymphoma j. Restaging sigmoidoscopy 10/03/2011-normal colonic mucosa 3. History of iron deficiency. Ferritin was in the low normal range on 11/14/2010. Hemoglobin remains normal. 4. ? left upper lung nodule on a chest x-ray 08/16/2010-no hypermetabolic lung nodule was seen on a PET scan 09/13/2010  Disposition:  He is now in clinical remission after bendamustine/rituximab therapy. He will return for an office visit in 4 months. We again discussed maintenance rituximab therapy. We decided against maintenance rituximab secondary to his lack of a clear response to single agent rituximab and the absence of a survival benefit with this approach.   Thornton Papas, MD  10/30/2011  12:10 PM

## 2011-11-02 DIAGNOSIS — R5381 Other malaise: Secondary | ICD-10-CM | POA: Diagnosis not present

## 2011-11-02 DIAGNOSIS — R7989 Other specified abnormal findings of blood chemistry: Secondary | ICD-10-CM | POA: Diagnosis not present

## 2011-11-02 DIAGNOSIS — E039 Hypothyroidism, unspecified: Secondary | ICD-10-CM | POA: Diagnosis not present

## 2011-11-02 DIAGNOSIS — E785 Hyperlipidemia, unspecified: Secondary | ICD-10-CM | POA: Diagnosis not present

## 2011-11-02 DIAGNOSIS — E559 Vitamin D deficiency, unspecified: Secondary | ICD-10-CM | POA: Diagnosis not present

## 2011-11-02 DIAGNOSIS — Z125 Encounter for screening for malignant neoplasm of prostate: Secondary | ICD-10-CM | POA: Diagnosis not present

## 2011-11-15 DIAGNOSIS — E785 Hyperlipidemia, unspecified: Secondary | ICD-10-CM | POA: Diagnosis not present

## 2011-11-15 DIAGNOSIS — Z0000 Encounter for general adult medical examination without abnormal findings: Secondary | ICD-10-CM | POA: Diagnosis not present

## 2011-11-15 DIAGNOSIS — E039 Hypothyroidism, unspecified: Secondary | ICD-10-CM | POA: Diagnosis not present

## 2011-11-15 DIAGNOSIS — Z23 Encounter for immunization: Secondary | ICD-10-CM | POA: Diagnosis not present

## 2011-11-27 DIAGNOSIS — H02409 Unspecified ptosis of unspecified eyelid: Secondary | ICD-10-CM | POA: Diagnosis not present

## 2011-12-31 DIAGNOSIS — H02409 Unspecified ptosis of unspecified eyelid: Secondary | ICD-10-CM

## 2011-12-31 DIAGNOSIS — Z01810 Encounter for preprocedural cardiovascular examination: Secondary | ICD-10-CM | POA: Diagnosis not present

## 2011-12-31 HISTORY — DX: Unspecified ptosis of unspecified eyelid: H02.409

## 2012-01-14 DIAGNOSIS — H02409 Unspecified ptosis of unspecified eyelid: Secondary | ICD-10-CM | POA: Diagnosis not present

## 2012-01-21 DIAGNOSIS — Z09 Encounter for follow-up examination after completed treatment for conditions other than malignant neoplasm: Secondary | ICD-10-CM | POA: Diagnosis not present

## 2012-01-21 DIAGNOSIS — Z9889 Other specified postprocedural states: Secondary | ICD-10-CM | POA: Diagnosis not present

## 2012-02-26 ENCOUNTER — Telehealth: Payer: Self-pay | Admitting: Oncology

## 2012-02-26 ENCOUNTER — Other Ambulatory Visit (HOSPITAL_BASED_OUTPATIENT_CLINIC_OR_DEPARTMENT_OTHER): Payer: Medicare Other | Admitting: Lab

## 2012-02-26 ENCOUNTER — Ambulatory Visit (HOSPITAL_BASED_OUTPATIENT_CLINIC_OR_DEPARTMENT_OTHER): Payer: Medicare Other | Admitting: Oncology

## 2012-02-26 VITALS — BP 144/81 | HR 64 | Temp 98.1°F | Resp 18 | Ht 68.0 in | Wt 173.0 lb

## 2012-02-26 DIAGNOSIS — D509 Iron deficiency anemia, unspecified: Secondary | ICD-10-CM

## 2012-02-26 DIAGNOSIS — C8590 Non-Hodgkin lymphoma, unspecified, unspecified site: Secondary | ICD-10-CM

## 2012-02-26 DIAGNOSIS — C8583 Other specified types of non-Hodgkin lymphoma, intra-abdominal lymph nodes: Secondary | ICD-10-CM

## 2012-02-26 LAB — CBC & DIFF AND RETIC
BASO%: 1.2 % (ref 0.0–2.0)
EOS%: 3.3 % (ref 0.0–7.0)
Eosinophils Absolute: 0.2 10*3/uL (ref 0.0–0.5)
LYMPH%: 25.6 % (ref 14.0–49.0)
MCH: 32.5 pg (ref 27.2–33.4)
MCHC: 36.2 g/dL — ABNORMAL HIGH (ref 32.0–36.0)
MCV: 89.8 fL (ref 79.3–98.0)
MONO%: 14.2 % — ABNORMAL HIGH (ref 0.0–14.0)
Platelets: 179 10*3/uL (ref 140–400)
RBC: 4.89 10*6/uL (ref 4.20–5.82)
nRBC: 0 % (ref 0–0)

## 2012-02-26 NOTE — Telephone Encounter (Signed)
Gave pt appt for lab and MD for August 2014 °

## 2012-02-26 NOTE — Progress Notes (Signed)
   [facility]    OFFICE PROGRESS NOTE   INTERVAL HISTORY:   He returns as scheduled. He feels well. No fever, night sweats, or palpable lymph nodes. No rectal bleeding.  Objective:  Vital signs in last 24 hours:  Blood pressure 144/81, pulse 64, temperature 98.1 F (36.7 C), temperature source Oral, resp. rate 18, height 5\' 8"  (1.727 m), weight 173 lb (78.472 kg).    HEENT: Neck without mass Lymphatics: No cervical, supraclavicular, axillary, or inguinal nodes Resp: Lungs clear bilaterally Cardio: Regular rate and rhythm GI: No hepatosplenomegaly, nontender Vascular: No leg edema   Lab Results:  Lab Results  Component Value Date   WBC 4.9 02/26/2012   HGB 15.9 02/26/2012   HCT 43.9 02/26/2012   MCV 89.8 02/26/2012   PLT 179 02/26/2012   ANC 2.7    Medications: I have reviewed the patient's current medications.  Assessment/Plan: 1. History of intermittent rectal bleeding. 2. 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  a. 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  b. CT of the abdomen and pelvis 08/04/2010 with abdominal/pelvic lymphadenopathy. c. PET scan 09/13/2010 confirming mildly enlarged abdominal/pelvic lymph nodes with borderline increased FDG activity. d. Initiation of single agent rituximab therapy on 11/28/2010, he completed 4 weekly treatments on 12/21/2010 e. 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 f. Cycle 1 bendamustine/rituximab 05/01/2011 g. Cycle 2 bendamustine/rituximab 05/29/2011 h. Cycle 3 bendamustine/rituximab 06/26/2011 i. Restaging PET scan 07/27/2011-no evidence  of lymphoma j. Restaging sigmoidoscopy 10/03/2011-normal colonic mucosa 3. History of iron deficiency. Ferritin was in the low normal range on 11/14/2010. Hemoglobin remains normal. 4. ? left upper lung nodule on a chest x-ray 08/16/2010-no hypermetabolic lung nodule was seen on a PET scan 09/13/2010  Disposition:  He remains in clinical remission from non-Hodgkin's lymphoma. He will return for an office visit in 6 months.   Thornton Papas, MD  02/26/2012  11:25 AM

## 2012-07-16 DIAGNOSIS — D239 Other benign neoplasm of skin, unspecified: Secondary | ICD-10-CM | POA: Diagnosis not present

## 2012-07-16 DIAGNOSIS — L821 Other seborrheic keratosis: Secondary | ICD-10-CM | POA: Diagnosis not present

## 2012-07-16 DIAGNOSIS — D485 Neoplasm of uncertain behavior of skin: Secondary | ICD-10-CM | POA: Diagnosis not present

## 2012-07-16 DIAGNOSIS — L578 Other skin changes due to chronic exposure to nonionizing radiation: Secondary | ICD-10-CM | POA: Diagnosis not present

## 2012-07-25 ENCOUNTER — Telehealth: Payer: Self-pay | Admitting: *Deleted

## 2012-07-25 NOTE — Telephone Encounter (Signed)
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 Wanted to make MD aware. Left message on voicemail requesting pt have copy of pathology sent to office.

## 2012-08-03 ENCOUNTER — Other Ambulatory Visit: Payer: Self-pay | Admitting: Family Medicine

## 2012-08-04 DIAGNOSIS — C44211 Basal cell carcinoma of skin of unspecified ear and external auricular canal: Secondary | ICD-10-CM | POA: Diagnosis not present

## 2012-08-25 ENCOUNTER — Other Ambulatory Visit (HOSPITAL_BASED_OUTPATIENT_CLINIC_OR_DEPARTMENT_OTHER): Payer: Medicare Other

## 2012-08-25 ENCOUNTER — Ambulatory Visit (HOSPITAL_BASED_OUTPATIENT_CLINIC_OR_DEPARTMENT_OTHER): Payer: Medicare Other | Admitting: Oncology

## 2012-08-25 ENCOUNTER — Telehealth: Payer: Self-pay | Admitting: Oncology

## 2012-08-25 VITALS — BP 126/61 | HR 70 | Temp 97.7°F | Resp 20 | Ht 68.0 in | Wt 174.0 lb

## 2012-08-25 DIAGNOSIS — C8583 Other specified types of non-Hodgkin lymphoma, intra-abdominal lymph nodes: Secondary | ICD-10-CM

## 2012-08-25 DIAGNOSIS — C8590 Non-Hodgkin lymphoma, unspecified, unspecified site: Secondary | ICD-10-CM

## 2012-08-25 LAB — CBC WITH DIFFERENTIAL/PLATELET
BASO%: 1.1 % (ref 0.0–2.0)
HCT: 44.6 % (ref 38.4–49.9)
LYMPH%: 27.8 % (ref 14.0–49.0)
MCHC: 35.1 g/dL (ref 32.0–36.0)
MCV: 94.4 fL (ref 79.3–98.0)
MONO#: 0.5 10*3/uL (ref 0.1–0.9)
NEUT%: 57 % (ref 39.0–75.0)
Platelets: 159 10*3/uL (ref 140–400)
WBC: 4.8 10*3/uL (ref 4.0–10.3)

## 2012-08-25 NOTE — Progress Notes (Signed)
   [facility]    OFFICE PROGRESS NOTE   INTERVAL HISTORY:   He returns as scheduled. He feels well. No complaint. No fever or night sweats. No bleeding.  Objective:  Vital signs in last 24 hours:  Blood pressure 126/61, pulse 70, temperature 97.7 F (36.5 C), temperature source Oral, resp. rate 20, height 5\' 8"  (1.727 m), weight 174 lb (78.926 kg).    HEENT: Neck without mass Lymphatics: No cervical, supra-clavicular, axillary, or inguinal nodes Resp: Lungs clear bilaterally Cardio: Regular rate and rhythm GI: No hepatosplenomegaly, nontender Vascular: No leg edema   Lab Results:  Lab Results  Component Value Date   WBC 4.8 08/25/2012   HGB 15.7 08/25/2012   HCT 44.6 08/25/2012   MCV 94.4 08/25/2012   PLT 159 08/25/2012   ANC 2.7    Medications: I have reviewed the patient's current medications.  Assessment/Plan: 1. History of intermittent rectal bleeding. No recent bleeding 2. 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  a. 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  b. CT of the abdomen and pelvis 08/04/2010 with abdominal/pelvic lymphadenopathy. c. PET scan 09/13/2010 confirming mildly enlarged abdominal/pelvic lymph nodes with borderline increased FDG activity. d. Initiation of single agent rituximab therapy on 11/28/2010, he completed 4 weekly treatments on 12/21/2010 e. 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 f. Cycle 1 bendamustine/rituximab 05/01/2011 g. Cycle 2 bendamustine/rituximab 05/29/2011 h. Cycle 3 bendamustine/rituximab 06/26/2011 i. Restaging PET scan 07/27/2011-no  evidence of lymphoma j. Restaging sigmoidoscopy 10/03/2011-normal colonic mucosa 3. History of iron deficiency. Ferritin was in the low normal range on 11/14/2010. Hemoglobin remains normal. 4. ? left upper lung nodule on a chest x-ray 08/16/2010-no hypermetabolic lung nodule was seen on a PET scan 09/13/2010   Disposition:  He remains in clinical remission from non-Hodgkin's lymphoma. He will stay up-to-date on the influenza and pneumococcal vaccines. Mr. Larke will return for an office visit in 6 months. He will contact us in the interim for new symptoms.   Thornton Papas, MD  08/25/2012  10:55 AM

## 2012-08-25 NOTE — Telephone Encounter (Signed)
GV AND PRINTED APPT SCHED AND AVS FORPT... °

## 2012-09-11 ENCOUNTER — Telehealth: Payer: Self-pay | Admitting: Family Medicine

## 2012-09-12 ENCOUNTER — Other Ambulatory Visit: Payer: Self-pay | Admitting: Family Medicine

## 2012-09-16 DIAGNOSIS — L270 Generalized skin eruption due to drugs and medicaments taken internally: Secondary | ICD-10-CM | POA: Diagnosis not present

## 2012-09-16 DIAGNOSIS — L905 Scar conditions and fibrosis of skin: Secondary | ICD-10-CM | POA: Diagnosis not present

## 2012-09-16 DIAGNOSIS — Z85828 Personal history of other malignant neoplasm of skin: Secondary | ICD-10-CM | POA: Diagnosis not present

## 2012-09-16 DIAGNOSIS — L578 Other skin changes due to chronic exposure to nonionizing radiation: Secondary | ICD-10-CM | POA: Diagnosis not present

## 2012-09-16 DIAGNOSIS — L259 Unspecified contact dermatitis, unspecified cause: Secondary | ICD-10-CM | POA: Diagnosis not present

## 2012-09-17 ENCOUNTER — Other Ambulatory Visit: Payer: Self-pay | Admitting: Family Medicine

## 2012-09-18 NOTE — Telephone Encounter (Signed)
Last lipids 11/02/11   DWM

## 2012-09-18 NOTE — Telephone Encounter (Signed)
Have not received a call back from patient in regards to an appt.

## 2012-09-25 ENCOUNTER — Other Ambulatory Visit: Payer: Self-pay | Admitting: Family Medicine

## 2012-10-12 DIAGNOSIS — L259 Unspecified contact dermatitis, unspecified cause: Secondary | ICD-10-CM | POA: Diagnosis not present

## 2012-10-12 DIAGNOSIS — I10 Essential (primary) hypertension: Secondary | ICD-10-CM | POA: Diagnosis not present

## 2012-10-12 DIAGNOSIS — E039 Hypothyroidism, unspecified: Secondary | ICD-10-CM | POA: Diagnosis not present

## 2012-10-17 ENCOUNTER — Encounter: Payer: Self-pay | Admitting: *Deleted

## 2012-10-25 DIAGNOSIS — Z23 Encounter for immunization: Secondary | ICD-10-CM | POA: Diagnosis not present

## 2012-10-28 ENCOUNTER — Other Ambulatory Visit: Payer: Self-pay | Admitting: Family Medicine

## 2012-11-16 ENCOUNTER — Other Ambulatory Visit: Payer: Self-pay | Admitting: Family Medicine

## 2012-11-18 NOTE — Telephone Encounter (Signed)
This may be refilled, but please give patient an appointment to be seen

## 2012-11-18 NOTE — Telephone Encounter (Signed)
Last seen and last lipid 10/13  DWM

## 2012-12-01 ENCOUNTER — Other Ambulatory Visit: Payer: Self-pay | Admitting: Family Medicine

## 2012-12-04 NOTE — Telephone Encounter (Signed)
Last seen 11/15/11

## 2012-12-17 ENCOUNTER — Ambulatory Visit (INDEPENDENT_AMBULATORY_CARE_PROVIDER_SITE_OTHER): Payer: Medicare Other | Admitting: Family Medicine

## 2012-12-17 ENCOUNTER — Ambulatory Visit (INDEPENDENT_AMBULATORY_CARE_PROVIDER_SITE_OTHER): Payer: Medicare Other

## 2012-12-17 ENCOUNTER — Encounter: Payer: Self-pay | Admitting: Family Medicine

## 2012-12-17 ENCOUNTER — Encounter (INDEPENDENT_AMBULATORY_CARE_PROVIDER_SITE_OTHER): Payer: Self-pay

## 2012-12-17 VITALS — BP 123/80 | HR 80 | Temp 97.0°F | Ht 68.25 in | Wt 173.0 lb

## 2012-12-17 DIAGNOSIS — Z125 Encounter for screening for malignant neoplasm of prostate: Secondary | ICD-10-CM | POA: Diagnosis not present

## 2012-12-17 DIAGNOSIS — E785 Hyperlipidemia, unspecified: Secondary | ICD-10-CM

## 2012-12-17 DIAGNOSIS — N400 Enlarged prostate without lower urinary tract symptoms: Secondary | ICD-10-CM | POA: Diagnosis not present

## 2012-12-17 DIAGNOSIS — Z23 Encounter for immunization: Secondary | ICD-10-CM

## 2012-12-17 DIAGNOSIS — Z0000 Encounter for general adult medical examination without abnormal findings: Secondary | ICD-10-CM

## 2012-12-17 DIAGNOSIS — E039 Hypothyroidism, unspecified: Secondary | ICD-10-CM

## 2012-12-17 DIAGNOSIS — C8590 Non-Hodgkin lymphoma, unspecified, unspecified site: Secondary | ICD-10-CM

## 2012-12-17 DIAGNOSIS — N390 Urinary tract infection, site not specified: Secondary | ICD-10-CM

## 2012-12-17 DIAGNOSIS — C8589 Other specified types of non-Hodgkin lymphoma, extranodal and solid organ sites: Secondary | ICD-10-CM

## 2012-12-17 DIAGNOSIS — N529 Male erectile dysfunction, unspecified: Secondary | ICD-10-CM

## 2012-12-17 DIAGNOSIS — E559 Vitamin D deficiency, unspecified: Secondary | ICD-10-CM

## 2012-12-17 LAB — POCT URINALYSIS DIPSTICK
Bilirubin, UA: NEGATIVE
Glucose, UA: NEGATIVE
Ketones, UA: NEGATIVE
pH, UA: 5

## 2012-12-17 LAB — POCT CBC
Granulocyte percent: 60.8 %G (ref 37–80)
HCT, POC: 49.8 % (ref 43.5–53.7)
Hemoglobin: 15.8 g/dL (ref 14.1–18.1)
POC Granulocyte: 3.3 (ref 2–6.9)
RBC: 5.2 M/uL (ref 4.69–6.13)

## 2012-12-17 LAB — POCT UA - MICROSCOPIC ONLY: Mucus, UA: NEGATIVE

## 2012-12-17 MED ORDER — LEVOTHYROXINE SODIUM 75 MCG PO TABS: 75.0000 ug | ORAL_TABLET | Freq: Every day | ORAL | Status: DC

## 2012-12-17 MED ORDER — ATORVASTATIN CALCIUM 80 MG PO TABS: 80.0000 mg | ORAL_TABLET | ORAL | Status: DC

## 2012-12-17 MED ORDER — VARDENAFIL HCL 20 MG PO TABS: 20.0000 mg | ORAL_TABLET | Freq: Every day | ORAL | Status: DC | PRN

## 2012-12-17 MED ORDER — NIACIN ER (ANTIHYPERLIPIDEMIC) 500 MG PO TBCR: 500.0000 mg | EXTENDED_RELEASE_TABLET | Freq: Every day | ORAL | Status: DC

## 2012-12-17 NOTE — Patient Instructions (Addendum)
Continue current medications. Continue good therapeutic lifestyle changes which include good diet and exercise. Fall precautions discussed with patient. Schedule your flu vaccine if you haven't had it yet If you are over 73 years old - you may need Prevnar 13 or the adult Pneumonia vaccine. You will receive a Prevnar vaccine today and this might make your arm sore for 2-3 days. I am giving you a call a that will allow you to get Cialis daily 5 mg at the drugstore a 30 day supply for a period he may use this on a daily basis or you can use it on an as needed basis as a problem. This may help your erectile dysfunction but may also help your prostate symptoms.-If you like this medication he may call back and we can call a prescription in for you. We will also review all of your other medications to make sure that there current and up-to-date

## 2012-12-17 NOTE — Progress Notes (Signed)
Subjective:    Patient ID: Brad May, male    DOB: [birth year], 72 y.o.   MRN: [id number]  HPI Patient here today for Complete Physical. Patient is doing well overall. He was last seen here in October of 2013. 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 He's also been followed by the gastroenterologist. He is last colonoscopy was done in September 2013. He had a PET CT scan from the neck down in June of 2013. Dr. Sheryn Bison is the gastroenterologist and Dr. Jeanella Flattery is the oncologist. He has an appointment with the oncologist in February 2015.    Patient Active Problem List   Diagnosis Date Noted  . 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 Lymphoma 11/27/2010  . Nonspecific (abnormal) findings on radiological and other examination of gastrointestinal tract 09/25/2010  . Colitis 09/25/2010  . Gastritis and duodenitis 09/25/2010  . Polyp of stomach 09/25/2010   Outpatient Encounter Prescriptions as of 12/17/2012  Medication Sig  . aspirin 81 MG tablet Take 81 mg by mouth daily.    Marland Kitchen atorvastatin (LIPITOR) 80 MG tablet TAKE 1 TABLET BY MOUTH EVERY NIGHT  . b complex vitamins tablet Take 1 tablet by mouth daily.    . fish oil-omega-3 fatty acids 1000 MG capsule Take 1 g by mouth daily.    Marland Kitchen levothyroxine (SYNTHROID, LEVOTHROID) 75 MCG tablet TAKE 1 TABLET BY MOUTH EVERY DAY  . niacin (NIASPAN) 500 MG CR tablet TAKE 4 TABLETS BY MOUTH AT BEDTIME* TAKE FOUR HOURS AFTER COATED ASPIRIN  . [DISCONTINUED] levothyroxine (SYNTHROID) 50 MCG tablet Take 50 mcg by mouth daily.    Review of Systems  Constitutional: Negative.   HENT: Negative.   Eyes: Negative.   Respiratory: Negative.   Cardiovascular: Negative.   Gastrointestinal: Negative.   Endocrine: Negative.   Genitourinary: Negative.   Musculoskeletal: Negative.   Skin: Negative.   Allergic/Immunologic: Negative.   Neurological: Negative.   Hematological: Negative.     Psychiatric/Behavioral: Negative.        Objective:   Physical Exam  Nursing note and vitals reviewed. Constitutional: He is oriented to person, place, and time. He appears well-developed and well-nourished. No distress.  HENT:  Head: Normocephalic and atraumatic.  Right Ear: External ear normal.  Left Ear: External ear normal.  Nose: Nose normal.  Mouth/Throat: Oropharynx is clear and moist. No oropharyngeal exudate.  Eyes: Conjunctivae and EOM are normal. Pupils are equal, round, and reactive to light. Right eye exhibits no discharge. Left eye exhibits no discharge. No scleral icterus.  Neck: Normal range of motion. Neck supple. No tracheal deviation present. No thyromegaly present.  No carotid bruits  Cardiovascular: Normal rate, regular rhythm, normal heart sounds and intact distal pulses.  Exam reveals no gallop and no friction rub.   No murmur heard. At 84 per minute  Pulmonary/Chest: Effort normal and breath sounds normal. No respiratory distress. He has no wheezes. He has no rales. He exhibits no tenderness.  No axillary adenopathy  Abdominal: Soft. Bowel sounds are normal. He exhibits no mass. There is no tenderness. There is no rebound and no guarding.  No inguinal adenopathy there is a right lower quadrant surgical scar.  Genitourinary: Rectum normal and penis normal.  State is enlarged and smooth without any lops. There is no rectal masses. There was no inguinal hernia and there is no testicular masses or lumps.  Musculoskeletal: Normal range of motion. He exhibits no edema and no tenderness.  Lymphadenopathy:  He has no cervical adenopathy.  Neurological: He is alert and oriented to person, place, and time. He has normal reflexes. No cranial nerve deficit.  Skin: Skin is warm and dry. No rash noted. No erythema. No pallor.  Psychiatric: He has a normal mood and affect. His behavior is normal. Judgment and thought content normal.   BP 123/80  Pulse 80  Temp(Src) 97  F (36.1 C) (Oral)  Ht 5' 8.25" (1.734 m)  Wt 173 lb (78.472 kg)  BMI 26.10 kg/m2 Recent office visits with the oncologist and gastroenterologist were reviewed prior to seeing the patient and with the patient once I was with the patient in the exam room.  [facility] reading (PRIMARY) by  Dr. Christell Constant: Chest x-ray-within normal  EKG: Within normal limits                                        Assessment & Plan:  1. BPH (benign prostatic hyperplasia) - PSA, total and free - POCT CBC - POCT UA - Microscopic Only - POCT urinalysis dipstick -Samples of Cialis 5 given when patient is not taking Levitra  2. Hyperlipidemia - POCT CBC - Hepatic function panel - BMP8+EGFR - atorvastatin (LIPITOR) 80 MG tablet; Take 1 tablet (80 mg total) by mouth 1 day or 1 dose.  Dispense: 30 tablet; Refill: 0 - niacin (NIASPAN) 500 MG CR tablet; Take 1 tablet (500 mg total) by mouth at bedtime.  Dispense: 120 tablet; Refill: 0  3. Hypothyroidism - POCT CBC - Thyroid Panel With TSH - levothyroxine (SYNTHROID, LEVOTHROID) 75 MCG tablet; Take 1 tablet (75 mcg total) by mouth daily before breakfast.  Dispense: 90 tablet; Refill: 0  4. Vitamin D deficiency - Vit D  25 hydroxy (rtn osteoporosis monitoring)  5. Annual physical exam - EKG 12-Lead - DG Chest 2 View; Future - Pneumococcal conjugate vaccine 13-valent  6. Non Hodgkin's lymphoma -Followup with oncology -Follow up with gastroenterologist per the oncologist  7. Erectile dysfunction - vardenafil (LEVITRA) 20 MG tablet; Take 1 tablet (20 mg total) by mouth daily as needed for erectile dysfunction.  Dispense: 10 tablet; Refill: 0   Patient Instructions  Continue current medications. Continue good therapeutic lifestyle changes which include good diet and exercise. Fall precautions discussed with patient. Schedule your flu vaccine if you haven't had it yet If you are over 67 years old - you may need Prevnar 13 or the adult Pneumonia vaccine. You  will receive a Prevnar vaccine today and this might make your arm sore for 2-3 days. I am giving you a call a that will allow you to get Cialis daily 5 mg at the drugstore a 30 day supply for a period he may use this on a daily basis or you can use it on an as needed basis as a problem. This may help your erectile dysfunction but may also help your prostate symptoms.-If you like this medication he may call back and we can call a prescription in for you. We will also review all of your other medications to make sure that there current and up-to-date    Nyra Capes MD

## 2012-12-18 ENCOUNTER — Telehealth: Payer: Self-pay | Admitting: *Deleted

## 2012-12-18 LAB — BMP8+EGFR
BUN/Creatinine Ratio: 16 (ref 10–22)
Calcium: 9.8 mg/dL (ref 8.6–10.2)
GFR calc non Af Amer: 85 mL/min/{1.73_m2} (ref >59)
Potassium: 4.5 mmol/L (ref 3.5–5.2)
Sodium: 141 mmol/L (ref 134–144)

## 2012-12-18 LAB — HEPATIC FUNCTION PANEL
ALT: 33 IU/L (ref 0–44)
AST: 27 IU/L (ref 0–40)
Albumin: 4.5 g/dL (ref 3.5–4.8)
Bilirubin, Direct: 0.16 mg/dL (ref 0.00–0.40)
Total Bilirubin: 0.7 mg/dL (ref 0.0–1.2)

## 2012-12-18 LAB — THYROID PANEL WITH TSH: Free Thyroxine Index: 2.1 (ref 1.2–4.9)

## 2012-12-18 NOTE — Telephone Encounter (Signed)
Patient aware of CXR results 

## 2012-12-19 LAB — URINE CULTURE

## 2012-12-22 ENCOUNTER — Encounter: Payer: Self-pay | Admitting: *Deleted

## 2012-12-22 NOTE — Progress Notes (Signed)
Quick Note:  Copy of labs sent to patient ______ 

## 2012-12-25 ENCOUNTER — Telehealth: Payer: Self-pay | Admitting: Family Medicine

## 2013-01-12 NOTE — Telephone Encounter (Signed)
This is okay for this patient to have a lipoma meds panel done. This should have been done at his last visit. Please call the patient and let him that he come in fasting for a lipomed panel

## 2013-01-12 NOTE — Telephone Encounter (Signed)
Spoke with pt and he wants to have his cholesterol checked Please call if this ok

## 2013-01-13 DIAGNOSIS — L919 Hypertrophic disorder of the skin, unspecified: Secondary | ICD-10-CM | POA: Diagnosis not present

## 2013-01-13 DIAGNOSIS — D485 Neoplasm of uncertain behavior of skin: Secondary | ICD-10-CM | POA: Diagnosis not present

## 2013-01-13 DIAGNOSIS — Z85828 Personal history of other malignant neoplasm of skin: Secondary | ICD-10-CM | POA: Diagnosis not present

## 2013-01-13 DIAGNOSIS — L905 Scar conditions and fibrosis of skin: Secondary | ICD-10-CM | POA: Diagnosis not present

## 2013-01-13 DIAGNOSIS — L909 Atrophic disorder of skin, unspecified: Secondary | ICD-10-CM | POA: Diagnosis not present

## 2013-01-13 DIAGNOSIS — D239 Other benign neoplasm of skin, unspecified: Secondary | ICD-10-CM | POA: Diagnosis not present

## 2013-01-13 DIAGNOSIS — L259 Unspecified contact dermatitis, unspecified cause: Secondary | ICD-10-CM | POA: Diagnosis not present

## 2013-01-13 NOTE — Telephone Encounter (Signed)
Patient aware.

## 2013-02-12 DIAGNOSIS — H524 Presbyopia: Secondary | ICD-10-CM | POA: Diagnosis not present

## 2013-02-12 DIAGNOSIS — H52229 Regular astigmatism, unspecified eye: Secondary | ICD-10-CM | POA: Diagnosis not present

## 2013-02-12 DIAGNOSIS — H269 Unspecified cataract: Secondary | ICD-10-CM | POA: Diagnosis not present

## 2013-02-12 DIAGNOSIS — H5210 Myopia, unspecified eye: Secondary | ICD-10-CM | POA: Diagnosis not present

## 2013-02-17 ENCOUNTER — Other Ambulatory Visit (INDEPENDENT_AMBULATORY_CARE_PROVIDER_SITE_OTHER): Payer: Medicare Other

## 2013-02-17 DIAGNOSIS — E785 Hyperlipidemia, unspecified: Secondary | ICD-10-CM | POA: Diagnosis not present

## 2013-02-17 NOTE — Progress Notes (Signed)
Patient came in for labs only.

## 2013-02-19 LAB — NMR, LIPOPROFILE
CHOLESTEROL: 153 mg/dL (ref <200)
HDL Cholesterol by NMR: 43 mg/dL (ref >=40)
HDL PARTICLE NUMBER: 28.2 umol/L — AB (ref >=30.5)
LDL Particle Number: 1390 nmol/L — ABNORMAL HIGH (ref <1000)
LDL Size: 20.8 nm (ref >20.5)
LDLC SERPL CALC-MCNC: 94 mg/dL (ref <100)
LP-IR SCORE: 46 — AB (ref <=45)
Small LDL Particle Number: 561 nmol/L — ABNORMAL HIGH (ref <=527)
TRIGLYCERIDES BY NMR: 79 mg/dL (ref <150)

## 2013-03-05 ENCOUNTER — Other Ambulatory Visit (HOSPITAL_BASED_OUTPATIENT_CLINIC_OR_DEPARTMENT_OTHER): Payer: Medicare Other

## 2013-03-05 ENCOUNTER — Ambulatory Visit (HOSPITAL_BASED_OUTPATIENT_CLINIC_OR_DEPARTMENT_OTHER): Payer: Medicare Other | Admitting: Oncology

## 2013-03-05 VITALS — BP 128/78 | HR 66 | Temp 97.4°F | Resp 18 | Ht 68.0 in | Wt 175.7 lb

## 2013-03-05 DIAGNOSIS — C8583 Other specified types of non-Hodgkin lymphoma, intra-abdominal lymph nodes: Secondary | ICD-10-CM

## 2013-03-05 DIAGNOSIS — C8590 Non-Hodgkin lymphoma, unspecified, unspecified site: Secondary | ICD-10-CM

## 2013-03-05 LAB — CBC WITH DIFFERENTIAL/PLATELET
BASO%: 1.2 % (ref 0.0–2.0)
Basophils Absolute: 0.1 10*3/uL (ref 0.0–0.1)
EOS%: 2.1 % (ref 0.0–7.0)
Eosinophils Absolute: 0.1 10*3/uL (ref 0.0–0.5)
HEMATOCRIT: 46.4 % (ref 38.4–49.9)
HGB: 15.7 g/dL (ref 13.0–17.1)
LYMPH%: 28.6 % (ref 14.0–49.0)
MCH: 32.4 pg (ref 27.2–33.4)
MCHC: 33.9 g/dL (ref 32.0–36.0)
MCV: 95.7 fL (ref 79.3–98.0)
MONO#: 0.7 10*3/uL (ref 0.1–0.9)
MONO%: 11 % (ref 0.0–14.0)
NEUT#: 3.6 10*3/uL (ref 1.5–6.5)
NEUT%: 57.1 % (ref 39.0–75.0)
PLATELETS: 181 10*3/uL (ref 140–400)
RBC: 4.85 10*6/uL (ref 4.20–5.82)
RDW: 12.7 % (ref 11.0–14.6)
WBC: 6.3 10*3/uL (ref 4.0–10.3)
lymph#: 1.8 10*3/uL (ref 0.9–3.3)

## 2013-03-05 NOTE — Progress Notes (Signed)
   [place]    OFFICE PROGRESS NOTE   INTERVAL HISTORY:   He returns for scheduled followup of non-Hodgkin's lymphoma. Brad May feels well. No rectal bleeding. No anorexia. No complaint. He walks several miles per day for exercise.  Objective:  Vital signs in last 24 hours:  Blood pressure 128/78, pulse 66, temperature 97.4 F (36.3 C), temperature source Oral, resp. rate 18, height 5\' 8"  (1.727 m), weight 175 lb 11.2 oz (79.697 kg), SpO2 100.00%.    HEENT: Neck without mass Lymphatics: No cervical, supraclavicular, axillary, or inguinal nodes Resp: Lungs clear bilaterally Cardio: Regular rate and rhythm GI: No hepatosplenomegaly, nontender, no mass Vascular: No leg edema   Lab Results:  Lab Results  Component Value Date   WBC 6.3 03/05/2013   HGB 15.7 03/05/2013   HCT 46.4 03/05/2013   MCV 95.7 03/05/2013   PLT 181 03/05/2013   NEUTROABS 3.6 03/05/2013      Medications: I have reviewed the patient's current medications.  Assessment/Plan: 1. History of intermittent rectal bleeding. No recent bleeding 2. 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  a. 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  b. CT of the abdomen and pelvis 08/04/2010 with abdominal/pelvic lymphadenopathy. c. PET scan 09/13/2010 confirming mildly enlarged abdominal/pelvic lymph nodes with borderline increased FDG activity. d. Initiation of single agent rituximab therapy on 11/28/2010, he completed 4 weekly treatments on 12/21/2010 e. 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 f. Cycle 1 bendamustine/rituximab  05/01/2011 g. Cycle 2 bendamustine/rituximab 05/29/2011 h. Cycle 3 bendamustine/rituximab 06/26/2011 i. Restaging PET scan 07/27/2011-no evidence of lymphoma j. Restaging sigmoidoscopy 10/03/2011-normal colonic mucosa 3. History of iron deficiency. Ferritin was in the low normal range on 11/14/2010. Hemoglobin remains normal. 4. ? left upper lung nodule on a chest x-ray 46/50/3546-FK hypermetabolic lung nodule was seen on a PET scan 09/13/2010   Disposition:  [name] remains in clinical remission from non-Hodgkin's lymphoma. He will return for an office visit in 8 months. He will contact us in the interim for new symptoms.   Betsy Coder, MD  03/05/2013  4:27 PM

## 2013-03-06 ENCOUNTER — Telehealth: Payer: Self-pay | Admitting: Oncology

## 2013-03-06 NOTE — Telephone Encounter (Signed)
s.w. pt and advised on OCT appt....pt ok and aware °

## 2013-03-20 ENCOUNTER — Other Ambulatory Visit: Payer: Self-pay | Admitting: Family Medicine

## 2013-04-20 ENCOUNTER — Other Ambulatory Visit: Payer: Self-pay | Admitting: Family Medicine

## 2013-06-04 DIAGNOSIS — J019 Acute sinusitis, unspecified: Secondary | ICD-10-CM | POA: Diagnosis not present

## 2013-06-24 ENCOUNTER — Ambulatory Visit: Payer: Medicare Other | Admitting: Family Medicine

## 2013-06-30 ENCOUNTER — Encounter: Payer: Self-pay | Admitting: Family Medicine

## 2013-06-30 ENCOUNTER — Ambulatory Visit (INDEPENDENT_AMBULATORY_CARE_PROVIDER_SITE_OTHER): Payer: Medicare Other | Admitting: Family Medicine

## 2013-06-30 VITALS — BP 129/81 | HR 90 | Temp 97.8°F | Ht 68.0 in | Wt 169.0 lb

## 2013-06-30 DIAGNOSIS — E559 Vitamin D deficiency, unspecified: Secondary | ICD-10-CM

## 2013-06-30 DIAGNOSIS — N400 Enlarged prostate without lower urinary tract symptoms: Secondary | ICD-10-CM | POA: Diagnosis not present

## 2013-06-30 DIAGNOSIS — C8590 Non-Hodgkin lymphoma, unspecified, unspecified site: Secondary | ICD-10-CM

## 2013-06-30 DIAGNOSIS — C8589 Other specified types of non-Hodgkin lymphoma, extranodal and solid organ sites: Secondary | ICD-10-CM

## 2013-06-30 DIAGNOSIS — E785 Hyperlipidemia, unspecified: Secondary | ICD-10-CM | POA: Diagnosis not present

## 2013-06-30 DIAGNOSIS — E039 Hypothyroidism, unspecified: Secondary | ICD-10-CM | POA: Diagnosis not present

## 2013-06-30 NOTE — Patient Instructions (Addendum)
Medicare Annual Wellness Visit  [facility] and the medical providers at [facility] strive to bring you the best medical care.  In doing so we not only want to address your current medical conditions and concerns but also to detect new conditions early and prevent illness, disease and health-related problems.    Medicare offers a yearly Wellness Visit which allows our clinical staff to assess your need for preventative services including immunizations, lifestyle education, counseling to decrease risk of preventable diseases and screening for fall risk and other medical concerns.    This visit is provided free of charge (no copay) for all Medicare recipients. The clinical pharmacists at [facility] have begun to conduct these Wellness Visits which will also include a thorough review of all your medications.    As you primary medical provider recommend that you make an appointment for your Annual Wellness Visit if you have not done so already this year.  You may set up this appointment before you leave today or you may call back ([phone]) and schedule an appointment.  Please make sure when you call that you mention that you are scheduling your Annual Wellness Visit with the clinical pharmacist so that the appointment may be made for the proper length of time.      Continue current medications. Continue good therapeutic lifestyle changes which include good diet and exercise. Fall precautions discussed with patient. If an FOBT was given today- please return it to our front desk. If you are over 78 years old - you may need Prevnar 33 or the adult Pneumonia vaccine.  Return to clinic for your fasting blood work We will also get a urinalysis at that time

## 2013-06-30 NOTE — Addendum Note (Signed)
Addended by: Zannie Cove on: 06/30/2013 04:40 PM   Modules accepted: Orders

## 2013-06-30 NOTE — Progress Notes (Signed)
Subjective:    Patient ID: Brad May, male    DOB: 14-Sep-1940, 73 y.o.   MRN: [id number]  HPI Pt here for follow up and management of chronic medical problems. The patient is doing well with no particular complaints. He Brad May the oncologist for his non-Hodgkin's lymphoma back in February and this is currently in remission. He's been exercising regularly and feeling good. He will be given an FOBT to return. He'll also be given a note to return for fasting lab work.         Patient Active Problem List   Diagnosis Date Noted  . BPH (benign prostatic hyperplasia) 12/17/2012  . Hyperlipidemia 12/17/2012  . Hypothyroidism 12/17/2012  . Vitamin D deficiency 12/17/2012  . Non Hodgkin's lymphoma 12/17/2012  . 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 Nonspecific (abnormal) findings on radiological and other examination of gastrointestinal tract 09/25/2010  . Colitis 09/25/2010  . Gastritis and duodenitis 09/25/2010  . Polyp of stomach 09/25/2010   Outpatient Encounter Prescriptions as of 06/30/2013  Medication Sig  . aspirin 81 MG tablet Take 81 mg by mouth daily.    Marland Kitchen atorvastatin (LIPITOR) 80 MG tablet TAKE 1 TABLET BY MOUTH DAILY  . b complex vitamins tablet Take 1 tablet by mouth daily.    . fish oil-omega-3 fatty acids 1000 MG capsule Take 1 g by mouth daily.    Marland Kitchen levothyroxine (SYNTHROID, LEVOTHROID) 75 MCG tablet Take 1 tablet (75 mcg total) by mouth daily before breakfast.  . niacin (NIASPAN) 500 MG CR tablet TAKE 1 TABLET BY MOUTH AT BEDTIME  . vardenafil (LEVITRA) 20 MG tablet Take 1 tablet (20 mg total) by mouth daily as needed for erectile dysfunction.    Review of Systems  Constitutional: Negative.   HENT: Negative.   Eyes: Negative.   Respiratory: Negative.   Cardiovascular: Negative.   Gastrointestinal: Negative.   Endocrine: Negative.   Genitourinary: Negative.   Musculoskeletal: Negative.   Skin: Negative.     Allergic/Immunologic: Negative.   Neurological: Negative.   Hematological: Negative.   Psychiatric/Behavioral: Negative.        Objective:   Physical Exam  Nursing note and vitals reviewed. Constitutional: He is oriented to person, place, and time. He appears well-developed and well-nourished. No distress.  HENT:  Head: Normocephalic and atraumatic.  Right Ear: External ear normal.  Left Ear: External ear normal.  Nose: Nose normal.  Mouth/Throat: Oropharynx is clear and moist. No oropharyngeal exudate.  Eyes: Conjunctivae and EOM are normal. Pupils are equal, round, and reactive to light. Right eye exhibits no discharge. Left eye exhibits no discharge. No scleral icterus.  Neck: Normal range of motion. Neck supple. No thyromegaly present.  No adenopathy and no bruits  Cardiovascular: Normal rate, regular rhythm, normal heart sounds and intact distal pulses.  Exam reveals no gallop and no friction rub.   No murmur heard. At 72 per minute  Pulmonary/Chest: Effort normal and breath sounds normal. No respiratory distress. He has no wheezes. He has no rales. He exhibits no tenderness.  No axillary adenopathy with good breath sounds anteriorly and posteriorly  Abdominal: Soft. Bowel sounds are normal. He exhibits no mass. There is no tenderness. There is no rebound and no guarding.  No inguinal adenopathy  Genitourinary: Rectum normal.  Musculoskeletal: Normal range of motion. He exhibits no edema and no tenderness.  Lymphadenopathy:    He has no cervical adenopathy.  Neurological: He is alert and oriented to person, place, and  time. He has normal reflexes. No cranial nerve deficit.  Skin: Skin is warm and dry. No rash noted. No erythema. No pallor.  Psychiatric: He has a normal mood and affect. His behavior is normal. Judgment and thought content normal.   BP 129/81  Pulse 90  Temp(Src) 97.8 F (36.6 C) (Oral)  Ht 5' 8" (1.727 m)  Wt 169 lb (76.658 kg)  BMI 25.70  kg/m2        Assessment & Plan:  1. BPH (benign prostatic hyperplasia) - POCT CBC; Future  2. Hyperlipidemia - POCT CBC; Future - BMP8+EGFR; Future - Hepatic function panel; Future - NMR, lipoprofile; Future  3. Hypothyroidism - POCT CBC; Future  4. Vitamin D deficiency - POCT CBC; Future - Vit D  25 hydroxy (rtn osteoporosis monitoring); Future  5. Lymphoma - POCT CBC; Future  Patient Instructions                       Medicare Annual Wellness Visit  [facility] and the medical providers at [facility] strive to bring you the best medical care.  In doing so we not only want to address your current medical conditions and concerns but also to detect new conditions early and prevent illness, disease and health-related problems.    Medicare offers a yearly Wellness Visit which allows our clinical staff to assess your need for preventative services including immunizations, lifestyle education, counseling to decrease risk of preventable diseases and screening for fall risk and other medical concerns.    This visit is provided free of charge (no copay) for all Medicare recipients. The clinical pharmacists at [facility] have begun to conduct these Wellness Visits which will also include a thorough review of all your medications.    As you primary medical provider recommend that you make an appointment for your Annual Wellness Visit if you have not done so already this year.  You may set up this appointment before you leave today or you may call back [phone]) and schedule an appointment.  Please make sure when you call that you mention that you are scheduling your Annual Wellness Visit with the clinical pharmacist so that the appointment may be made for the proper length of time.      Continue current medications. Continue good therapeutic lifestyle changes which include good diet and exercise. Fall precautions discussed with  patient. If an FOBT was given today- please return it to our front desk. If you are over 45 years old - you may need Prevnar 43 or the adult Pneumonia vaccine.  Return to clinic for your fasting blood work We will also get a urinalysis at that time   Arrie Senate MD

## 2013-07-08 ENCOUNTER — Other Ambulatory Visit (INDEPENDENT_AMBULATORY_CARE_PROVIDER_SITE_OTHER): Payer: Medicare Other

## 2013-07-08 DIAGNOSIS — E785 Hyperlipidemia, unspecified: Secondary | ICD-10-CM

## 2013-07-08 DIAGNOSIS — C8590 Non-Hodgkin lymphoma, unspecified, unspecified site: Secondary | ICD-10-CM

## 2013-07-08 DIAGNOSIS — C8589 Other specified types of non-Hodgkin lymphoma, extranodal and solid organ sites: Secondary | ICD-10-CM

## 2013-07-08 DIAGNOSIS — E559 Vitamin D deficiency, unspecified: Secondary | ICD-10-CM

## 2013-07-08 DIAGNOSIS — Z1212 Encounter for screening for malignant neoplasm of rectum: Secondary | ICD-10-CM

## 2013-07-08 DIAGNOSIS — N400 Enlarged prostate without lower urinary tract symptoms: Secondary | ICD-10-CM | POA: Diagnosis not present

## 2013-07-08 DIAGNOSIS — E039 Hypothyroidism, unspecified: Secondary | ICD-10-CM

## 2013-07-08 LAB — POCT URINALYSIS DIPSTICK
BILIRUBIN UA: NEGATIVE
GLUCOSE UA: NEGATIVE
KETONES UA: NEGATIVE
Leukocytes, UA: NEGATIVE
Nitrite, UA: NEGATIVE
PH UA: 5
Protein, UA: NEGATIVE
RBC UA: NEGATIVE
Spec Grav, UA: 1.02
Urobilinogen, UA: NEGATIVE

## 2013-07-08 LAB — POCT CBC
GRANULOCYTE PERCENT: 64.9 % (ref 37–80)
HCT, POC: 46.4 % (ref 43.5–53.7)
Hemoglobin: 15.1 g/dL (ref 14.1–18.1)
Lymph, poc: 1.4 (ref 0.6–3.4)
MCH: 30.9 pg (ref 27–31.2)
MCHC: 32.5 g/dL (ref 31.8–35.4)
MCV: 95.1 fL (ref 80–97)
MPV: 6.3 fL (ref 0–99.8)
PLATELET COUNT, POC: 186 10*3/uL (ref 142–424)
POC Granulocyte: 3.2 (ref 2–6.9)
POC LYMPH %: 28.9 % (ref 10–50)
RBC: 4.9 M/uL (ref 4.69–6.13)
RDW, POC: 13.1 %
WBC: 4.9 10*3/uL (ref 4.6–10.2)

## 2013-07-08 LAB — POCT UA - MICROSCOPIC ONLY
Casts, Ur, LPF, POC: NEGATIVE
Crystals, Ur, HPF, POC: NEGATIVE
MUCUS UA: NEGATIVE
WBC, UR, HPF, POC: NEGATIVE
Yeast, UA: NEGATIVE

## 2013-07-08 NOTE — Addendum Note (Signed)
Addended by: Wyline Mood on: 07/08/2013 09:55 AM   Modules accepted: Orders

## 2013-07-08 NOTE — Addendum Note (Signed)
Addended by: Pollyann Kennedy F on: 07/08/2013 10:09 AM   Modules accepted: Orders

## 2013-07-08 NOTE — Progress Notes (Signed)
Pt came in for labs only 

## 2013-07-09 LAB — NMR, LIPOPROFILE
Cholesterol: 158 mg/dL (ref 100–199)
HDL Cholesterol by NMR: 48 mg/dL (ref >39)
HDL Particle Number: 31.6 umol/L (ref >=30.5)
LDL PARTICLE NUMBER: 1230 nmol/L — AB (ref <1000)
LDL Size: 21.1 nm (ref >20.5)
LDLC SERPL CALC-MCNC: 92 mg/dL (ref 0–99)
LP-IR Score: 34 (ref <=45)
Small LDL Particle Number: 526 nmol/L (ref <=527)
TRIGLYCERIDES BY NMR: 91 mg/dL (ref 0–149)

## 2013-07-09 LAB — BMP8+EGFR
BUN/Creatinine Ratio: 13 (ref 10–22)
BUN: 13 mg/dL (ref 8–27)
CALCIUM: 9.3 mg/dL (ref 8.6–10.2)
CO2: 24 mmol/L (ref 18–29)
CREATININE: 1.01 mg/dL (ref 0.76–1.27)
Chloride: 100 mmol/L (ref 97–108)
GFR calc Af Amer: 85 mL/min/{1.73_m2} (ref >59)
GFR calc non Af Amer: 73 mL/min/{1.73_m2} (ref >59)
Glucose: 108 mg/dL — ABNORMAL HIGH (ref 65–99)
POTASSIUM: 5.1 mmol/L (ref 3.5–5.2)
Sodium: 136 mmol/L (ref 134–144)

## 2013-07-09 LAB — HEPATIC FUNCTION PANEL
ALT: 23 IU/L (ref 0–44)
AST: 28 IU/L (ref 0–40)
Albumin: 4.3 g/dL (ref 3.5–4.8)
Alkaline Phosphatase: 108 IU/L (ref 39–117)
BILIRUBIN TOTAL: 0.7 mg/dL (ref 0.0–1.2)
Bilirubin, Direct: 0.17 mg/dL (ref 0.00–0.40)
Total Protein: 6.4 g/dL (ref 6.0–8.5)

## 2013-07-09 LAB — FECAL OCCULT BLOOD, IMMUNOCHEMICAL: Fecal Occult Bld: NEGATIVE

## 2013-07-09 LAB — VITAMIN D 25 HYDROXY (VIT D DEFICIENCY, FRACTURES): Vit D, 25-Hydroxy: 46.7 ng/mL (ref 30.0–100.0)

## 2013-07-14 DIAGNOSIS — L821 Other seborrheic keratosis: Secondary | ICD-10-CM | POA: Diagnosis not present

## 2013-07-14 DIAGNOSIS — D1801 Hemangioma of skin and subcutaneous tissue: Secondary | ICD-10-CM | POA: Diagnosis not present

## 2013-07-14 DIAGNOSIS — D239 Other benign neoplasm of skin, unspecified: Secondary | ICD-10-CM | POA: Diagnosis not present

## 2013-07-22 ENCOUNTER — Other Ambulatory Visit: Payer: Self-pay | Admitting: Family Medicine

## 2013-07-24 ENCOUNTER — Other Ambulatory Visit: Payer: Self-pay | Admitting: Family Medicine

## 2013-09-28 ENCOUNTER — Telehealth: Payer: Self-pay | Admitting: Oncology

## 2013-09-28 NOTE — Telephone Encounter (Signed)
Pt r/s due to conflict.Marland Kitchen..KJ

## 2013-09-28 NOTE — Telephone Encounter (Signed)
moved 10/2 appt to 10/7 - schedule change. lmonvm for pt and mailed schedule.

## 2013-10-05 ENCOUNTER — Other Ambulatory Visit: Payer: Self-pay | Admitting: Family Medicine

## 2013-10-06 NOTE — Telephone Encounter (Signed)
Last ov 06/30/13. Last refill 08/08/13.

## 2013-10-16 ENCOUNTER — Other Ambulatory Visit: Payer: Medicare Other

## 2013-10-16 ENCOUNTER — Ambulatory Visit: Payer: Medicare Other | Admitting: Oncology

## 2013-10-20 ENCOUNTER — Other Ambulatory Visit: Payer: Medicare Other

## 2013-10-20 ENCOUNTER — Ambulatory Visit: Payer: Medicare Other | Admitting: Oncology

## 2013-10-21 ENCOUNTER — Ambulatory Visit (HOSPITAL_BASED_OUTPATIENT_CLINIC_OR_DEPARTMENT_OTHER): Payer: Medicare Other | Admitting: Oncology

## 2013-10-21 ENCOUNTER — Other Ambulatory Visit: Payer: Medicare Other

## 2013-10-21 ENCOUNTER — Ambulatory Visit (HOSPITAL_BASED_OUTPATIENT_CLINIC_OR_DEPARTMENT_OTHER): Payer: Medicare Other

## 2013-10-21 ENCOUNTER — Telehealth: Payer: Self-pay | Admitting: Oncology

## 2013-10-21 VITALS — BP 127/76 | HR 72 | Temp 98.3°F | Resp 18 | Ht 68.0 in | Wt 174.9 lb

## 2013-10-21 DIAGNOSIS — C8590 Non-Hodgkin lymphoma, unspecified, unspecified site: Secondary | ICD-10-CM

## 2013-10-21 DIAGNOSIS — C8383 Other non-follicular lymphoma, intra-abdominal lymph nodes: Secondary | ICD-10-CM

## 2013-10-21 DIAGNOSIS — Z23 Encounter for immunization: Secondary | ICD-10-CM

## 2013-10-21 LAB — CBC WITH DIFFERENTIAL/PLATELET
BASO%: 1 % (ref 0.0–2.0)
Basophils Absolute: 0.1 10*3/uL (ref 0.0–0.1)
EOS ABS: 0.2 10*3/uL (ref 0.0–0.5)
EOS%: 2.7 % (ref 0.0–7.0)
HCT: 46.6 % (ref 38.4–49.9)
HGB: 15.5 g/dL (ref 13.0–17.1)
LYMPH%: 27.2 % (ref 14.0–49.0)
MCH: 31.7 pg (ref 27.2–33.4)
MCHC: 33.2 g/dL (ref 32.0–36.0)
MCV: 95.5 fL (ref 79.3–98.0)
MONO#: 0.8 10*3/uL (ref 0.1–0.9)
MONO%: 10.8 % (ref 0.0–14.0)
NEUT%: 58.3 % (ref 39.0–75.0)
NEUTROS ABS: 4.3 10*3/uL (ref 1.5–6.5)
PLATELETS: 184 10*3/uL (ref 140–400)
RBC: 4.88 10*6/uL (ref 4.20–5.82)
RDW: 12.5 % (ref 11.0–14.6)
WBC: 7.4 10*3/uL (ref 4.0–10.3)
lymph#: 2 10*3/uL (ref 0.9–3.3)

## 2013-10-21 MED ORDER — INFLUENZA VAC SPLIT QUAD 0.5 ML IM SUSY
0.5000 mL | PREFILLED_SYRINGE | Freq: Once | INTRAMUSCULAR | Status: AC
  Administered 2013-10-21: 0.5 mL via INTRAMUSCULAR
  Filled 2013-10-21: qty 0.5

## 2013-10-21 MED ORDER — PNEUMOCOCCAL 13-VAL CONJ VACC IM SUSP: 0.5000 mL | INTRAMUSCULAR | Status: DC

## 2013-10-21 NOTE — Telephone Encounter (Signed)
gv and printed appt sched and avs for pt for June 2016....sent pt to lab

## 2013-10-21 NOTE — Progress Notes (Signed)
  [facility] OFFICE PROGRESS NOTE   Diagnosis: Non-Hodgkin's lymphoma  INTERVAL HISTORY:   He returns as scheduled. He feels well. No fever, night sweats, or anorexia. No palpable lymph nodes. No rectal bleeding. He has a cold sore at the lower lip.  Objective:  Vital signs in last 24 hours:  Blood pressure 127/76, pulse 72, temperature 98.3 F (36.8 C), temperature source Oral, resp. rate 18, height 5\' 8"  (1.727 m), weight 174 lb 14.4 oz (79.334 kg), SpO2 98.00%.    HEENT: Healing herpetic lesion at the left lower lip Lymphatics: No cervical, supraclavicular, axillary, or inguinal nodes Resp: Lungs clear bilaterally Cardio: Regular rate and rhythm GI: No hepatosplenomegaly, nontender, no mass Vascular: No leg edema   Lab Results:  Lab Results  Component Value Date   WBC 4.9 07/08/2013   HGB 15.1 07/08/2013   HCT 46.4 07/08/2013   MCV 95.1 07/08/2013   PLT 181 03/05/2013   NEUTROABS 3.6 03/05/2013     Medications: I have reviewed the patient's current medications.  Assessment/Plan: 1. History of intermittent rectal bleeding. No recent bleeding 2. 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  a. 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  b. CT of the abdomen and pelvis 08/04/2010 with abdominal/pelvic lymphadenopathy. c. PET scan 09/13/2010 confirming mildly enlarged abdominal/pelvic lymph nodes with borderline increased FDG activity. d. Initiation of single agent rituximab therapy on 11/28/2010, he completed 4 weekly treatments on 12/21/2010 e. 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 f. Cycle 1  bendamustine/rituximab 05/01/2011 g. Cycle 2 bendamustine/rituximab 05/29/2011 h. Cycle 3 bendamustine/rituximab 06/26/2011 i. Restaging PET scan 07/27/2011-no evidence of lymphoma j. Restaging sigmoidoscopy 10/03/2011-normal colonic mucosa 3. History of iron deficiency. Ferritin was in the low normal range on 11/14/2010. Hemoglobin remains normal. 4. ? left upper lung nodule on a chest x-ray 78/93/8101-BP hypermetabolic lung nodule was seen on a PET scan 09/13/2010  Disposition:  Mr. Hartlage remains in clinical remission from non-Hodgkin's lymphoma. He will return for an office visit in 8 months. We will check a CBC today. He received an influenza vaccine today.  Betsy Coder, MD  10/21/2013  11:27 AM

## 2013-11-11 ENCOUNTER — Other Ambulatory Visit: Payer: Self-pay | Admitting: Family Medicine

## 2013-12-29 ENCOUNTER — Encounter: Payer: Self-pay | Admitting: Family Medicine

## 2013-12-29 ENCOUNTER — Ambulatory Visit (INDEPENDENT_AMBULATORY_CARE_PROVIDER_SITE_OTHER): Payer: Medicare Other | Admitting: Family Medicine

## 2013-12-29 VITALS — BP 123/83 | HR 71 | Temp 97.4°F | Ht 68.0 in | Wt 172.0 lb

## 2013-12-29 DIAGNOSIS — E785 Hyperlipidemia, unspecified: Secondary | ICD-10-CM | POA: Diagnosis not present

## 2013-12-29 DIAGNOSIS — E039 Hypothyroidism, unspecified: Secondary | ICD-10-CM | POA: Diagnosis not present

## 2013-12-29 DIAGNOSIS — N400 Enlarged prostate without lower urinary tract symptoms: Secondary | ICD-10-CM

## 2013-12-29 DIAGNOSIS — N5201 Erectile dysfunction due to arterial insufficiency: Secondary | ICD-10-CM | POA: Diagnosis not present

## 2013-12-29 DIAGNOSIS — E559 Vitamin D deficiency, unspecified: Secondary | ICD-10-CM

## 2013-12-29 DIAGNOSIS — N529 Male erectile dysfunction, unspecified: Secondary | ICD-10-CM | POA: Diagnosis not present

## 2013-12-29 LAB — POCT URINALYSIS DIPSTICK
BILIRUBIN UA: NEGATIVE
Glucose, UA: NEGATIVE
KETONES UA: NEGATIVE
LEUKOCYTES UA: NEGATIVE
Nitrite, UA: NEGATIVE
Protein, UA: NEGATIVE
Spec Grav, UA: 1.015
Urobilinogen, UA: NEGATIVE
pH, UA: 6.5

## 2013-12-29 LAB — POCT UA - MICROSCOPIC ONLY
BACTERIA, U MICROSCOPIC: NEGATIVE
Casts, Ur, LPF, POC: NEGATIVE
Crystals, Ur, HPF, POC: NEGATIVE
Mucus, UA: NEGATIVE
Yeast, UA: NEGATIVE

## 2013-12-29 MED ORDER — ATORVASTATIN CALCIUM 80 MG PO TABS: 80.0000 mg | ORAL_TABLET | Freq: Every day | ORAL | Status: DC

## 2013-12-29 MED ORDER — SILDENAFIL CITRATE 20 MG PO TABS
ORAL_TABLET | ORAL | Status: DC
Start: 2013-12-29 — End: 2015-01-03

## 2013-12-29 NOTE — Patient Instructions (Addendum)
Medicare Annual Wellness Visit  [facility] and the medical providers at [facility] strive to bring you the best medical care.  In doing so we not only want to address your current medical conditions and concerns but also to detect new conditions early and prevent illness, disease and health-related problems.    Medicare offers a yearly Wellness Visit which allows our clinical staff to assess your need for preventative services including immunizations, lifestyle education, counseling to decrease risk of preventable diseases and screening for fall risk and other medical concerns.    This visit is provided free of charge (no copay) for all Medicare recipients. The clinical pharmacists at [facility] have begun to conduct these Wellness Visits which will also include a thorough review of all your medications.    As you primary medical provider recommend that you make an appointment for your Annual Wellness Visit if you have not done so already this year.  You may set up this appointment before you leave today or you may call back ([phone]) and schedule an appointment.  Please make sure when you call that you mention that you are scheduling your Annual Wellness Visit with the clinical pharmacist so that the appointment may be made for the proper length of time.     Continue current medications. Continue good therapeutic lifestyle changes which include good diet and exercise. Fall precautions discussed with patient. If an FOBT was given today- please return it to our front desk. If you are over 51 years old - you may need Prevnar 6 or the adult Pneumonia vaccine.  Flu Shots will be available at our office starting mid- September. Please call and schedule a FLU CLINIC APPOINTMENT.   We will discuss your need for repeat colonoscopies with the gastroenterologist Dr. Zenovia Jarred Continue to follow-up with oncologist Continue to  exercise regularly and take your current medication

## 2013-12-29 NOTE — Progress Notes (Signed)
Subjective:    Patient ID: Brad May, male    DOB: [birth year], 73 y.o.   MRN: [id number]  HPI Pt here for follow up and management of chronic medical problems. The patient is here for follow-up of multiple medical problems. He is followed by the oncologist, Dr. Malachy Mood for his lymphoma. He is also been seen in the past because of colon polyps by the gastroenterologist, Dr. Sharlett Iles. He appears to be up-to-date on all of his immunizations. He does need a refill on his atorvastatin. He would also like to get generic Viagra from [facility] and we will give him a prescription for this.       Patient Active Problem List   Diagnosis Date Noted  . BPH (benign prostatic hyperplasia) 12/17/2012  . Hyperlipidemia 12/17/2012  . Hypothyroidism 12/17/2012  . Vitamin D deficiency 12/17/2012  . 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 Nonspecific (abnormal) findings on radiological and other examination of gastrointestinal tract 09/25/2010  . Colitis 09/25/2010  . Gastritis and duodenitis 09/25/2010  . Polyp of stomach 09/25/2010   Outpatient Encounter Prescriptions as of 12/29/2013  Medication Sig  . aspirin 81 MG tablet Take 81 mg by mouth daily.    Marland Kitchen atorvastatin (LIPITOR) 80 MG tablet Take 1 tablet (80 mg total) by mouth daily.  Marland Kitchen b complex vitamins tablet Take 1 tablet by mouth daily.    . fish oil-omega-3 fatty acids 1000 MG capsule Take 1 g by mouth daily.    Marland Kitchen LEVITRA 20 MG tablet TAKE 1 TABLET (20 MG TOTAL) BY MOUTH DAILY AS NEEDED FOR ERECTILE DYSFUNCTION.  Marland Kitchen levothyroxine (SYNTHROID, LEVOTHROID) 75 MCG tablet TAKE 1 TABLET (75 MCG TOTAL) BY MOUTH DAILY BEFORE BREAKFAST.  Marland Kitchen niacin (NIASPAN) 500 MG CR tablet TAKE 1 TABLET BY MOUTH AT BEDTIME  . [DISCONTINUED] atorvastatin (LIPITOR) 80 MG tablet TAKE 1 TABLET BY MOUTH DAILY    Review of Systems  Constitutional: Negative.   HENT: Negative.   Eyes: Negative.   Respiratory: Negative.     Cardiovascular: Negative.   Gastrointestinal: Negative.   Endocrine: Negative.   Genitourinary: Negative.   Musculoskeletal: Negative.   Skin: Negative.   Allergic/Immunologic: Negative.   Neurological: Negative.   Hematological: Negative.   Psychiatric/Behavioral: Negative.        Objective:   Physical Exam  Constitutional: He is oriented to person, place, and time. He appears well-developed and well-nourished. No distress.  HENT:  Head: Normocephalic and atraumatic.  Right Ear: External ear normal.  Left Ear: External ear normal.  Nose: Nose normal.  Mouth/Throat: Oropharynx is clear and moist. No oropharyngeal exudate.  Eyes: Conjunctivae and EOM are normal. Pupils are equal, round, and reactive to light. Right eye exhibits no discharge. Left eye exhibits no discharge. No scleral icterus.  Neck: Normal range of motion. Neck supple. No thyromegaly present.  No anterior cervical or posterior cervical adenopathy. No thyromegaly.  Cardiovascular: Normal rate, regular rhythm, normal heart sounds and intact distal pulses.  Exam reveals no gallop and no friction rub.   No murmur heard. The heart has a regular rate and rhythm at 72/m without murmurs.  Pulmonary/Chest: Effort normal and breath sounds normal. No respiratory distress. He has no wheezes. He has no rales. He exhibits no tenderness.  There is no axillary adenopathy  Abdominal: Soft. Bowel sounds are normal. He exhibits no mass. There is no tenderness. There is no rebound and no guarding.  There is no inguinal adenopathy or organ megaly.  Genitourinary: Rectum normal and penis normal.  The prostate is enlarged but soft and smooth without masses. There is no rectal masses. There is no inguinal hernia palpable. The external genitalia were within normal limits.  Musculoskeletal: Normal range of motion. He exhibits no edema or tenderness.  Lymphadenopathy:    He has no cervical adenopathy.  Neurological: He is alert and  oriented to person, place, and time. He has normal reflexes. No cranial nerve deficit.  Skin: Skin is warm and dry. No rash noted. No erythema. No pallor.  Psychiatric: He has a normal mood and affect. His behavior is normal. Judgment and thought content normal.  Nursing note and vitals reviewed.  BP 123/83 mmHg  Pulse 71  Temp(Src) 97.4 F (36.3 C) (Oral)  Ht '5\' 8"'  (1.727 m)  Wt 172 lb (78.019 kg)  BMI 26.16 kg/m2        Assessment & Plan:  1. BPH (benign prostatic hyperplasia) - POCT CBC - POCT urinalysis dipstick - POCT UA - Microscopic Only - PSA, total and free - Urine culture  2. Hyperlipidemia - POCT CBC - BMP8+EGFR - Hepatic function panel - NMR, lipoprofile  3. Hypothyroidism, unspecified hypothyroidism type - POCT CBC  4. Vitamin D deficiency - POCT CBC - Vit D  25 hydroxy (rtn osteoporosis monitoring)  5. Erectile dysfunction due to arterial insufficiency -Prescription for generic Viagra will be given to patient today.  Meds ordered this encounter  Medications  . atorvastatin (LIPITOR) 80 MG tablet    Sig: Take 1 tablet (80 mg total) by mouth daily.    Dispense:  30 tablet    Refill:  6  . sildenafil (REVATIO) 20 MG tablet    Sig: Take 2- 5 tabs as needed for sexual activity.    Dispense:  50 tablet    Refill:  1   Patient Instructions                       Medicare Annual Wellness Visit  [facility] and the medical providers at [facility] strive to bring you the best medical care.  In doing so we not only want to address your current medical conditions and concerns but also to detect new conditions early and prevent illness, disease and health-related problems.    Medicare offers a yearly Wellness Visit which allows our clinical staff to assess your need for preventative services including immunizations, lifestyle education, counseling to decrease risk of preventable diseases and screening for fall risk and other medical  concerns.    This visit is provided free of charge (no copay) for all Medicare recipients. The clinical pharmacists at [facility] have begun to conduct these Wellness Visits which will also include a thorough review of all your medications.    As you primary medical provider recommend that you make an appointment for your Annual Wellness Visit if you have not done so already this year.  You may set up this appointment before you leave today or you may call back ([phone]) and schedule an appointment.  Please make sure when you call that you mention that you are scheduling your Annual Wellness Visit with the clinical pharmacist so that the appointment may be made for the proper length of time.     Continue current medications. Continue good therapeutic lifestyle changes which include good diet and exercise. Fall precautions discussed with patient. If an FOBT was given today- please return it to our front desk. If  you are over 27 years old - you may need Prevnar 41 or the adult Pneumonia vaccine.  Flu Shots will be available at our office starting mid- September. Please call and schedule a FLU CLINIC APPOINTMENT.   We will discuss your need for repeat colonoscopies with the gastroenterologist Dr. Zenovia Jarred Continue to follow-up with oncologist Continue to exercise regularly and take your current medication    Arrie Senate MD

## 2013-12-29 NOTE — Addendum Note (Signed)
Addended by: Selmer Dominion on: 12/29/2013 10:46 AM   Modules accepted: Orders

## 2013-12-30 LAB — NMR, LIPOPROFILE
Cholesterol: 137 mg/dL (ref 100–199)
HDL Cholesterol by NMR: 45 mg/dL (ref >39)
HDL Particle Number: 29.5 umol/L — ABNORMAL LOW (ref >=30.5)
LDL Particle Number: 988 nmol/L (ref <1000)
LDL Size: 21.2 nm (ref >20.5)
LDL-C: 72 mg/dL (ref 0–99)
LP-IR Score: 42 (ref <=45)
Small LDL Particle Number: 384 nmol/L (ref <=527)
Triglycerides by NMR: 99 mg/dL (ref 0–149)

## 2013-12-30 LAB — HEPATIC FUNCTION PANEL
ALT: 28 IU/L (ref 0–44)
AST: 27 IU/L (ref 0–40)
Albumin: 4.2 g/dL (ref 3.5–4.8)
Alkaline Phosphatase: 95 IU/L (ref 39–117)
Bilirubin, Direct: 0.22 mg/dL (ref 0.00–0.40)
TOTAL PROTEIN: 6.6 g/dL (ref 6.0–8.5)
Total Bilirubin: 0.9 mg/dL (ref 0.0–1.2)

## 2013-12-30 LAB — CBC WITH DIFFERENTIAL
BASOS ABS: 0 10*3/uL (ref 0.0–0.2)
Basos: 1 %
EOS ABS: 0.2 10*3/uL (ref 0.0–0.4)
Eos: 3 %
HCT: 45.3 % (ref 37.5–51.0)
Hemoglobin: 15.9 g/dL (ref 12.6–17.7)
IMMATURE GRANS (ABS): 0 10*3/uL (ref 0.0–0.1)
Immature Granulocytes: 0 %
Lymphocytes Absolute: 1.5 10*3/uL (ref 0.7–3.1)
Lymphs: 19 %
MCH: 33.3 pg — AB (ref 26.6–33.0)
MCHC: 35.1 g/dL (ref 31.5–35.7)
MCV: 95 fL (ref 79–97)
Monocytes Absolute: 0.8 10*3/uL (ref 0.1–0.9)
Monocytes: 11 %
NEUTROS PCT: 66 %
Neutrophils Absolute: 5.1 10*3/uL (ref 1.4–7.0)
PLATELETS: 177 10*3/uL (ref 150–379)
RBC: 4.78 x10E6/uL (ref 4.14–5.80)
RDW: 14.1 % (ref 12.3–15.4)
WBC: 7.6 10*3/uL (ref 3.4–10.8)

## 2013-12-30 LAB — VITAMIN D 25 HYDROXY (VIT D DEFICIENCY, FRACTURES): Vit D, 25-Hydroxy: 41.3 ng/mL (ref 30.0–100.0)

## 2013-12-30 LAB — URINE CULTURE

## 2013-12-30 LAB — PSA, TOTAL AND FREE
PSA FREE PCT: 17.8 %
PSA FREE: 0.66 ng/mL
PSA: 3.7 ng/mL (ref 0.0–4.0)

## 2013-12-30 LAB — BMP8+EGFR
BUN / CREAT RATIO: 13 (ref 10–22)
BUN: 11 mg/dL (ref 8–27)
CO2: 23 mmol/L (ref 18–29)
CREATININE: 0.85 mg/dL (ref 0.76–1.27)
Calcium: 9.2 mg/dL (ref 8.6–10.2)
Chloride: 103 mmol/L (ref 97–108)
GFR calc non Af Amer: 86 mL/min/{1.73_m2} (ref >59)
GFR, EST AFRICAN AMERICAN: 99 mL/min/{1.73_m2} (ref >59)
Glucose: 100 mg/dL — ABNORMAL HIGH (ref 65–99)
Potassium: 4.2 mmol/L (ref 3.5–5.2)
SODIUM: 141 mmol/L (ref 134–144)

## 2013-12-30 NOTE — Addendum Note (Signed)
Addended by: Wyline Mood on: 12/30/2013 09:48 AM   Modules accepted: Orders

## 2013-12-31 ENCOUNTER — Telehealth: Payer: Self-pay

## 2013-12-31 NOTE — Telephone Encounter (Signed)
Wife aware of results.

## 2013-12-31 NOTE — Telephone Encounter (Signed)
-----   Message from [name], MD sent at 12/30/2013  5:47 PM EST ----- There was minimal growth on a urine culture and no antibiotic treatment is necessary.

## 2014-01-01 ENCOUNTER — Other Ambulatory Visit: Payer: Self-pay | Admitting: Family Medicine

## 2014-01-23 ENCOUNTER — Telehealth: Payer: Self-pay | Admitting: Family Medicine

## 2014-01-23 NOTE — Telephone Encounter (Signed)
I discussed the PSA rck with patient - he will come by this coming week for this.   NOTE: Pt states that on Thursday 1/7- he experienced very bad abdominal pain and was very constipated. He tried to find releif at home, but ended up going to the ER Thursday. He states that when he finally has a BM - the pain was immediately resolved.  He wants Dr Laurance Flatten to know about this and let him know if he should be concerned with his intestinal history.   He was a Dr Sharlett Iles pt and sees oncology still for follow up's

## 2014-01-24 NOTE — Telephone Encounter (Signed)
Repeat the PSA as planned. Please remind the patient to drink plenty of fluids. If these problems with recurrent abdominal pain continues he should get back in touch with [place] and we will get in touch with Dr. Zenovia Jarred for a follow-up who is a gastroenterologist. Please confirm with the patient that there was no blood in the stool.

## 2014-01-25 NOTE — Telephone Encounter (Signed)
Patient aware and pt states that he did not see any blood.

## 2014-01-26 ENCOUNTER — Other Ambulatory Visit (INDEPENDENT_AMBULATORY_CARE_PROVIDER_SITE_OTHER): Payer: Medicare Other

## 2014-01-26 DIAGNOSIS — N400 Enlarged prostate without lower urinary tract symptoms: Secondary | ICD-10-CM | POA: Diagnosis not present

## 2014-01-26 NOTE — Progress Notes (Signed)
Lab only 

## 2014-01-27 LAB — PSA, TOTAL AND FREE
PSA, Free Pct: 19.6 %
PSA, Free: 0.55 ng/mL
PSA: 2.8 ng/mL (ref 0.0–4.0)

## 2014-02-08 ENCOUNTER — Other Ambulatory Visit: Payer: Self-pay | Admitting: Family Medicine

## 2014-02-08 NOTE — Telephone Encounter (Signed)
Last TSH 10/2012, next appt 06/2014

## 2014-02-08 NOTE — Telephone Encounter (Signed)
Please have patient come by at his convenience in traveling back and forth to [facility] etc. and get a thyroid profile. He does not have to be fasting. This is okay to refill this medication until his next appointment

## 2014-04-01 ENCOUNTER — Other Ambulatory Visit: Payer: Self-pay | Admitting: Family Medicine

## 2014-05-07 ENCOUNTER — Other Ambulatory Visit: Payer: Self-pay | Admitting: Family Medicine

## 2014-05-07 NOTE — Telephone Encounter (Signed)
Last seen 12/29/13 DWM  Last thyroid level 12/17/12

## 2014-05-08 NOTE — Telephone Encounter (Signed)
no more refills without being seen  

## 2014-06-18 ENCOUNTER — Telehealth: Payer: Self-pay | Admitting: Oncology

## 2014-06-18 NOTE — Telephone Encounter (Signed)
Due to PAL moved 6/9 appointments for 7/8. Left message for patient and mailed schedule.

## 2014-06-24 ENCOUNTER — Other Ambulatory Visit: Payer: Medicare Other

## 2014-06-24 ENCOUNTER — Ambulatory Visit: Payer: Medicare Other | Admitting: Oncology

## 2014-07-01 ENCOUNTER — Ambulatory Visit: Payer: Medicare Other | Admitting: Family Medicine

## 2014-07-07 DIAGNOSIS — H524 Presbyopia: Secondary | ICD-10-CM | POA: Diagnosis not present

## 2014-07-07 DIAGNOSIS — H52223 Regular astigmatism, bilateral: Secondary | ICD-10-CM | POA: Diagnosis not present

## 2014-07-07 DIAGNOSIS — H5213 Myopia, bilateral: Secondary | ICD-10-CM | POA: Diagnosis not present

## 2014-07-07 DIAGNOSIS — H25093 Other age-related incipient cataract, bilateral: Secondary | ICD-10-CM | POA: Diagnosis not present

## 2014-07-07 DIAGNOSIS — H02402 Unspecified ptosis of left eyelid: Secondary | ICD-10-CM | POA: Diagnosis not present

## 2014-07-08 DIAGNOSIS — H52223 Regular astigmatism, bilateral: Secondary | ICD-10-CM | POA: Diagnosis not present

## 2014-07-08 DIAGNOSIS — T1512XA Foreign body in conjunctival sac, left eye, initial encounter: Secondary | ICD-10-CM | POA: Diagnosis not present

## 2014-07-08 DIAGNOSIS — H25093 Other age-related incipient cataract, bilateral: Secondary | ICD-10-CM | POA: Diagnosis not present

## 2014-07-08 DIAGNOSIS — H524 Presbyopia: Secondary | ICD-10-CM | POA: Diagnosis not present

## 2014-07-08 DIAGNOSIS — H02402 Unspecified ptosis of left eyelid: Secondary | ICD-10-CM | POA: Diagnosis not present

## 2014-07-08 DIAGNOSIS — H5213 Myopia, bilateral: Secondary | ICD-10-CM | POA: Diagnosis not present

## 2014-07-09 DIAGNOSIS — H02402 Unspecified ptosis of left eyelid: Secondary | ICD-10-CM | POA: Diagnosis not present

## 2014-07-09 DIAGNOSIS — H5213 Myopia, bilateral: Secondary | ICD-10-CM | POA: Diagnosis not present

## 2014-07-09 DIAGNOSIS — H25093 Other age-related incipient cataract, bilateral: Secondary | ICD-10-CM | POA: Diagnosis not present

## 2014-07-09 DIAGNOSIS — H52223 Regular astigmatism, bilateral: Secondary | ICD-10-CM | POA: Diagnosis not present

## 2014-07-09 DIAGNOSIS — H524 Presbyopia: Secondary | ICD-10-CM | POA: Diagnosis not present

## 2014-07-12 ENCOUNTER — Other Ambulatory Visit: Payer: Self-pay

## 2014-07-15 ENCOUNTER — Telehealth: Payer: Self-pay

## 2014-07-15 ENCOUNTER — Ambulatory Visit (INDEPENDENT_AMBULATORY_CARE_PROVIDER_SITE_OTHER): Payer: Medicare Other | Admitting: Family Medicine

## 2014-07-15 ENCOUNTER — Encounter: Payer: Self-pay | Admitting: Family Medicine

## 2014-07-15 VITALS — BP 131/82 | HR 65 | Temp 96.7°F | Ht 68.0 in | Wt 172.0 lb

## 2014-07-15 DIAGNOSIS — N5201 Erectile dysfunction due to arterial insufficiency: Secondary | ICD-10-CM | POA: Diagnosis not present

## 2014-07-15 DIAGNOSIS — E785 Hyperlipidemia, unspecified: Secondary | ICD-10-CM | POA: Diagnosis not present

## 2014-07-15 DIAGNOSIS — E559 Vitamin D deficiency, unspecified: Secondary | ICD-10-CM | POA: Diagnosis not present

## 2014-07-15 DIAGNOSIS — E039 Hypothyroidism, unspecified: Secondary | ICD-10-CM | POA: Diagnosis not present

## 2014-07-15 DIAGNOSIS — N400 Enlarged prostate without lower urinary tract symptoms: Secondary | ICD-10-CM

## 2014-07-15 DIAGNOSIS — C8590 Non-Hodgkin lymphoma, unspecified, unspecified site: Secondary | ICD-10-CM

## 2014-07-15 LAB — POCT CBC
GRANULOCYTE PERCENT: 51.4 % (ref 37–80)
HCT, POC: 49.1 % (ref 43.5–53.7)
Hemoglobin: 15.6 g/dL (ref 14.1–18.1)
Lymph, poc: 2 (ref 0.6–3.4)
MCH: 30.4 pg (ref 27–31.2)
MCHC: 31.8 g/dL (ref 31.8–35.4)
MCV: 95.4 fL (ref 80–97)
MPV: 6.3 fL (ref 0–99.8)
POC Granulocyte: 2.6 (ref 2–6.9)
POC LYMPH PERCENT: 38.4 %L (ref 10–50)
Platelet Count, POC: 186 10*3/uL (ref 142–424)
RBC: 5.15 M/uL (ref 4.69–6.13)
RDW, POC: 12.9 %
WBC: 5.1 10*3/uL (ref 4.6–10.2)

## 2014-07-15 MED ORDER — NIACIN ER (ANTIHYPERLIPIDEMIC) 500 MG PO TBCR: 500.0000 mg | EXTENDED_RELEASE_TABLET | Freq: Every day | ORAL | Status: DC

## 2014-07-15 NOTE — Telephone Encounter (Signed)
-----   Message from Jerene Bears, MD sent at 07/15/2014  1:48 PM EDT ----- Leroy Sea, I got a call from Dr. Morrie Sheldon today regarding this patient. He is to see you on 07/23/2014 for follow-up of history of intestinal lymphoma. Was previously followed by Dr. Sharlett Iles, Dr. Laurance Flatten wondered when he needed another colonoscopy. Curious as to whether you think he needs surveillance of his colon from a lymphoma standpoint? I will see him in clinic also Thanks for your input  Ether Griffins, Please schedule pt for new visit with me Thanks JMP

## 2014-07-15 NOTE — Progress Notes (Signed)
Subjective:    Patient ID: Brad May, male    DOB: January 03, 1941, 74 y.o.   MRN: [id number]  HPI Pt here for follow up and management of chronic medical problems which includes hyperlipidemia, and hypothyroid. He is taking medications regularly. The patient also has a history of non-Hodgkin's lymphoma. This was found from an abnormal colonoscopy and he is now followed by the oncologist Dr. Benay Spice. The patient is doing well. He exercises regularly. He denies chest pain shortness of breath or any GI symptoms including the presence of blood in the stool. He is passing his water okay. He has an upcoming appointment with his oncologist in July. He is not sure when he needs his next colonoscopy and we will look into that for him and let him know.     Patient Active Problem List   Diagnosis Date Noted  . BPH (benign prostatic hyperplasia) 12/17/2012  . Hyperlipidemia 12/17/2012  . Hypothyroidism 12/17/2012  . Vitamin D deficiency 12/17/2012  . 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 Nonspecific (abnormal) findings on radiological and other examination of gastrointestinal tract 09/25/2010  . Colitis 09/25/2010  . Gastritis and duodenitis 09/25/2010  . Polyp of stomach 09/25/2010   Outpatient Encounter Prescriptions as of 07/15/2014  Medication Sig  . aspirin 81 MG tablet Take 81 mg by mouth daily.    Marland Kitchen atorvastatin (LIPITOR) 80 MG tablet TAKE 1 TABLET BY MOUTH DAILY  . b complex vitamins tablet Take 1 tablet by mouth daily.    . fish oil-omega-3 fatty acids 1000 MG capsule Take 1 g by mouth daily.    Marland Kitchen LEVITRA 20 MG tablet TAKE 1 TABLET (20 MG TOTAL) BY MOUTH DAILY AS NEEDED FOR ERECTILE DYSFUNCTION.  Marland Kitchen levothyroxine (SYNTHROID, LEVOTHROID) 75 MCG tablet TAKE 1 TABLET BY MOUTH DAILY BEFORE BREAKFAST.  Marland Kitchen niacin (NIASPAN) 500 MG CR tablet TAKE 1 TABLET BY MOUTH AT BEDTIME  . sildenafil (REVATIO) 20 MG tablet Take 2- 5 tabs as needed for sexual activity.  .  [DISCONTINUED] atorvastatin (LIPITOR) 80 MG tablet Take 1 tablet (80 mg total) by mouth daily.   No facility-administered encounter medications on file as of 07/15/2014.      Review of Systems  Constitutional: Negative.   HENT: Negative.   Eyes: Negative.   Respiratory: Negative.   Cardiovascular: Negative.   Gastrointestinal: Negative.   Endocrine: Negative.   Genitourinary: Negative.   Musculoskeletal: Negative.   Skin: Negative.   Allergic/Immunologic: Negative.   Neurological: Negative.   Hematological: Negative.   Psychiatric/Behavioral: Negative.        Objective:   Physical Exam  Constitutional: He is oriented to person, place, and time. He appears well-developed and well-nourished. No distress.  HENT:  Head: Normocephalic and atraumatic.  Right Ear: External ear normal.  Left Ear: External ear normal.  Nose: Nose normal.  Mouth/Throat: Oropharynx is clear and moist. No oropharyngeal exudate.  Eyes: Conjunctivae and EOM are normal. Pupils are equal, round, and reactive to light. Right eye exhibits no discharge. Left eye exhibits no discharge. No scleral icterus.  Neck: Normal range of motion. Neck supple. No thyromegaly present.  Cardiovascular: Normal rate, regular rhythm, normal heart sounds and intact distal pulses.  Exam reveals no gallop and no friction rub.   No murmur heard. At 72/m  Pulmonary/Chest: Effort normal and breath sounds normal. No respiratory distress. He has no wheezes. He has no rales. He exhibits no tenderness.  No chest wall masses No axillary adenopathy  Abdominal: Soft. Bowel sounds are normal. He exhibits no mass. There is no tenderness. There is no rebound and no guarding.  No inguinal adenopathy  Musculoskeletal: Normal range of motion. He exhibits no edema or tenderness.  Lymphadenopathy:    He has no cervical adenopathy.  Neurological: He is alert and oriented to person, place, and time. He has normal reflexes. No cranial nerve deficit.   Skin: Skin is warm and dry. No rash noted. No erythema. No pallor.  Psychiatric: He has a normal mood and affect. His behavior is normal. Judgment and thought content normal.  Nursing note and vitals reviewed.  BP 131/82 mmHg  Pulse 65  Temp(Src) 96.7 F (35.9 C) (Other (Comment))  Ht '5\' 8"'  (1.727 m)  Wt 172 lb (78.019 kg)  BMI 26.16 kg/m2        Assessment & Plan:  1. BPH (benign prostatic hyperplasia) -The patient is doing well with this and having no problems passing his water. - POCT CBC  2. Hyperlipidemia -He will continue to take his atorvastatin and practice aggressive therapeutic lifestyle changes pending results of lab work to be done today. - POCT CBC - BMP8+EGFR - Hepatic function panel - NMR, lipoprofile  3. Hypothyroidism, unspecified hypothyroidism type -His energy level is good and no treatment changes are necessary unless the thyroid tests have changed. - POCT CBC - Thyroid Panel With TSH  4. Vitamin D deficiency -He will continue his current vitamin D replacement pending results of lab work - POCT CBC - Vit D  25 hydroxy (rtn osteoporosis monitoring)  5. Non-Hodgkin's lymphoma -He will have a follow-up visit with Dr. Benay Spice in July and he sees him every 6 months. This is the patient's oncologist. - POCT CBC  6. Erectile dysfunction due to arterial insufficiency -He is using Levitra for this and has no complaints with this today.  7. Indolent lymphoma -Follow up with oncology as planned.  Meds ordered this encounter  Medications  . niacin (NIASPAN) 500 MG CR tablet    Sig: Take 1 tablet (500 mg total) by mouth at bedtime.    Dispense:  90 tablet    Refill:  3   Patient Instructions                       Medicare Annual Wellness Visit  [facility] and the medical providers at [facility] strive to bring you the best medical care.  In doing so we not only want to address your current medical conditions and concerns but  also to detect new conditions early and prevent illness, disease and health-related problems.    Medicare offers a yearly Wellness Visit which allows our clinical staff to assess your need for preventative services including immunizations, lifestyle education, counseling to decrease risk of preventable diseases and screening for fall risk and other medical concerns.    This visit is provided free of charge (no copay) for all Medicare recipients. The clinical pharmacists at [facility] have begun to conduct these Wellness Visits which will also include a thorough review of all your medications.    As you primary medical provider recommend that you make an appointment for your Annual Wellness Visit if you have not done so already this year.  You may set up this appointment before you leave today or you may call back ([phone]) and schedule an appointment.  Please make sure when you call that you mention that you are scheduling your Annual  Wellness Visit with the clinical pharmacist so that the appointment may be made for the proper length of time.    Continue current medications. Continue good therapeutic lifestyle changes which include good diet and exercise. Fall precautions discussed with patient. If an FOBT was given today- please return it to our front desk. If you are over 25 years old - you may need Prevnar 60 or the adult Pneumonia vaccine.  Flu Shots are still available at our office. If you still haven't had one please call to set up a nurse visit to get one.   After your visit with [place] today you will receive a survey in the mail or online from [employer] regarding your care with [place]. Please take a moment to fill this out. Your feedback is very important to [place] as you can help [place] better understand your patient needs as well as improve your experience and satisfaction. WE CARE ABOUT YOU!!!   The patient should continue to exercise regularly and drink plenty of fluids  especially the summer He should follow-up with the oncologist as planned and with the gastroenterologist He should return the FOBT not forget to get his flu shot in the fall Check FOBT every 6 months   Arrie Senate MD

## 2014-07-15 NOTE — Telephone Encounter (Signed)
Pt scheduled to see Dr. Hilarie Fredrickson 08/30/14@4pm . Letter mailed to pt.

## 2014-07-15 NOTE — Patient Instructions (Addendum)
Medicare Annual Wellness Visit  [facility] and the medical providers at [facility] strive to bring you the best medical care.  In doing so we not only want to address your current medical conditions and concerns but also to detect new conditions early and prevent illness, disease and health-related problems.    Medicare offers a yearly Wellness Visit which allows our clinical staff to assess your need for preventative services including immunizations, lifestyle education, counseling to decrease risk of preventable diseases and screening for fall risk and other medical concerns.    This visit is provided free of charge (no copay) for all Medicare recipients. The clinical pharmacists at [facility] have begun to conduct these Wellness Visits which will also include a thorough review of all your medications.    As you primary medical provider recommend that you make an appointment for your Annual Wellness Visit if you have not done so already this year.  You may set up this appointment before you leave today or you may call back [phone]) and schedule an appointment.  Please make sure when you call that you mention that you are scheduling your Annual Wellness Visit with the clinical pharmacist so that the appointment may be made for the proper length of time.    Continue current medications. Continue good therapeutic lifestyle changes which include good diet and exercise. Fall precautions discussed with patient. If an FOBT was given today- please return it to our front desk. If you are over 75 years old - you may need Prevnar 84 or the adult Pneumonia vaccine.  Flu Shots are still available at our office. If you still haven't had one please call to set up a nurse visit to get one.   After your visit with [place] today you will receive a survey in the mail or online from [employer] regarding your care with [place]. Please take a moment to  fill this out. Your feedback is very important to [place] as you can help [place] better understand your patient needs as well as improve your experience and satisfaction. WE CARE ABOUT YOU!!!   The patient should continue to exercise regularly and drink plenty of fluids especially the summer He should follow-up with the oncologist as planned and with the gastroenterologist He should return the FOBT not forget to get his flu shot in the fall Check FOBT every 6 months We will discuss your need for colonoscopy with Dr. Zenovia Jarred

## 2014-07-16 LAB — BMP8+EGFR
BUN/Creatinine Ratio: 17 (ref 10–22)
BUN: 15 mg/dL (ref 8–27)
CO2: 21 mmol/L (ref 18–29)
CREATININE: 0.9 mg/dL (ref 0.76–1.27)
Calcium: 9.2 mg/dL (ref 8.6–10.2)
Chloride: 102 mmol/L (ref 97–108)
GFR calc Af Amer: 97 mL/min/{1.73_m2} (ref >59)
GFR calc non Af Amer: 84 mL/min/{1.73_m2} (ref >59)
GLUCOSE: 113 mg/dL — AB (ref 65–99)
POTASSIUM: 4.5 mmol/L (ref 3.5–5.2)
Sodium: 140 mmol/L (ref 134–144)

## 2014-07-16 LAB — HEPATIC FUNCTION PANEL
ALK PHOS: 100 IU/L (ref 39–117)
ALT: 27 IU/L (ref 0–44)
AST: 25 IU/L (ref 0–40)
Albumin: 4.2 g/dL (ref 3.5–4.8)
BILIRUBIN, DIRECT: 0.2 mg/dL (ref 0.00–0.40)
Bilirubin Total: 0.8 mg/dL (ref 0.0–1.2)
TOTAL PROTEIN: 6.5 g/dL (ref 6.0–8.5)

## 2014-07-16 LAB — NMR, LIPOPROFILE
CHOLESTEROL: 143 mg/dL (ref 100–199)
HDL Cholesterol by NMR: 39 mg/dL — ABNORMAL LOW (ref >39)
HDL Particle Number: 28 umol/L — ABNORMAL LOW (ref >=30.5)
LDL Particle Number: 1180 nmol/L — ABNORMAL HIGH (ref <1000)
LDL Size: 20.7 nm (ref >20.5)
LDL-C: 89 mg/dL (ref 0–99)
LP-IR Score: 56 — ABNORMAL HIGH (ref <=45)
Small LDL Particle Number: 586 nmol/L — ABNORMAL HIGH (ref <=527)
TRIGLYCERIDES BY NMR: 77 mg/dL (ref 0–149)

## 2014-07-16 LAB — THYROID PANEL WITH TSH
Free Thyroxine Index: 2.1 (ref 1.2–4.9)
T3 Uptake Ratio: 30 % (ref 24–39)
T4 TOTAL: 7 ug/dL (ref 4.5–12.0)
TSH: 5.12 u[IU]/mL — ABNORMAL HIGH (ref 0.450–4.500)

## 2014-07-16 LAB — VITAMIN D 25 HYDROXY (VIT D DEFICIENCY, FRACTURES): Vit D, 25-Hydroxy: 45.7 ng/mL (ref 30.0–100.0)

## 2014-07-21 NOTE — Progress Notes (Signed)
lmtcb

## 2014-07-23 ENCOUNTER — Telehealth: Payer: Self-pay | Admitting: Oncology

## 2014-07-23 ENCOUNTER — Other Ambulatory Visit (HOSPITAL_BASED_OUTPATIENT_CLINIC_OR_DEPARTMENT_OTHER): Payer: Medicare Other

## 2014-07-23 ENCOUNTER — Ambulatory Visit (HOSPITAL_BASED_OUTPATIENT_CLINIC_OR_DEPARTMENT_OTHER): Payer: Medicare Other | Admitting: Oncology

## 2014-07-23 ENCOUNTER — Other Ambulatory Visit: Payer: Self-pay | Admitting: *Deleted

## 2014-07-23 VITALS — BP 135/82 | HR 65 | Temp 97.8°F | Resp 17 | Ht 68.0 in | Wt 172.5 lb

## 2014-07-23 DIAGNOSIS — C8590 Non-Hodgkin lymphoma, unspecified, unspecified site: Secondary | ICD-10-CM

## 2014-07-23 DIAGNOSIS — Z8572 Personal history of non-Hodgkin lymphomas: Secondary | ICD-10-CM | POA: Diagnosis not present

## 2014-07-23 LAB — CBC WITH DIFFERENTIAL/PLATELET
BASO%: 0.6 % (ref 0.0–2.0)
Basophils Absolute: 0 10*3/uL (ref 0.0–0.1)
EOS%: 3.5 % (ref 0.0–7.0)
Eosinophils Absolute: 0.2 10*3/uL (ref 0.0–0.5)
HEMATOCRIT: 43.8 % (ref 38.4–49.9)
HGB: 15.3 g/dL (ref 13.0–17.1)
LYMPH%: 42 % (ref 14.0–49.0)
MCH: 32.4 pg (ref 27.2–33.4)
MCHC: 34.9 g/dL (ref 32.0–36.0)
MCV: 92.8 fL (ref 79.3–98.0)
MONO#: 0.6 10*3/uL (ref 0.1–0.9)
MONO%: 12 % (ref 0.0–14.0)
NEUT#: 2.1 10*3/uL (ref 1.5–6.5)
NEUT%: 41.9 % (ref 39.0–75.0)
Platelets: 168 10*3/uL (ref 140–400)
RBC: 4.72 10*6/uL (ref 4.20–5.82)
RDW: 12.8 % (ref 11.0–14.6)
WBC: 4.9 10*3/uL (ref 4.0–10.3)
lymph#: 2.1 10*3/uL (ref 0.9–3.3)

## 2014-07-23 NOTE — Progress Notes (Signed)
  [name]   Diagnosis: Non-Hodgkin's lymphoma  INTERVAL HISTORY:   Brad May returns as scheduled. He feels well. No bleeding. He walks several miles per day. No fever or night sweats. No anorexia.  Objective:  Vital signs in last 24 hours:  Blood pressure 135/82, pulse 65, temperature 97.8 F (36.6 C), temperature source Oral, resp. rate 17, height 5\' 8"  (1.727 m), weight 172 lb 8 oz (78.245 kg).    HEENT: Neck without mass Lymphatics: No cervical, supraclavicular, left axillary, or inguinal nodes. Soft mobile 1 cm high right axillary node versus a prominent fat pad Resp: Lungs clear bilaterally Cardio: Regular rate and rhythm GI: No hepatosplenomegaly, nontender, no mass Vascular: No leg edema   Lab Results:  Lab Results  Component Value Date   WBC 4.9 07/23/2014   HGB 15.3 07/23/2014   HCT 43.8 07/23/2014   MCV 92.8 07/23/2014   PLT 168 07/23/2014   NEUTROABS 2.1 07/23/2014     Medications: I have reviewed the patient's current medications.  Assessment/Plan: 1. History of intermittent rectal bleeding. No recent bleeding 2. 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  1. 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  2. CT of the abdomen and pelvis 08/04/2010 with abdominal/pelvic lymphadenopathy. 3. PET scan 09/13/2010 confirming mildly enlarged abdominal/pelvic lymph nodes with borderline increased FDG activity. 4. Initiation of single agent rituximab therapy on 11/28/2010, he completed 4 weekly treatments on 12/21/2010 5. 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 6. Cycle 1 bendamustine/rituximab 05/01/2011 7. Cycle 2 bendamustine/rituximab 05/29/2011 8. Cycle 3 bendamustine/rituximab 06/26/2011 9. Restaging PET scan 07/27/2011-no evidence of lymphoma 10. Restaging sigmoidoscopy 10/03/2011-normal colonic mucosa 3. History of iron deficiency. Ferritin was in the low normal range on 11/14/2010. Hemoglobin remains normal. 4. ? left upper lung nodule on a chest x-ray 85/63/1497-[id number] hypermetabolic lung nodule was seen on a PET scan 09/13/2010   Disposition:  Brad May remains in clinical remission from non-Hodgkin's lymphoma. He will return for an office and lab visit in 9 months. He will contact us in the interim for new symptoms.  Betsy Coder, MD  07/23/2014  11:51 AM

## 2014-07-23 NOTE — Telephone Encounter (Signed)
Pt confirmed labs/ov per 07/08 POF, gave pt AVS and Calendar... KJ

## 2014-08-02 ENCOUNTER — Encounter: Payer: Self-pay | Admitting: *Deleted

## 2014-08-25 ENCOUNTER — Encounter: Payer: Self-pay | Admitting: *Deleted

## 2014-08-30 ENCOUNTER — Ambulatory Visit: Payer: BC Managed Care – PPO | Admitting: Internal Medicine

## 2014-09-23 ENCOUNTER — Ambulatory Visit (INDEPENDENT_AMBULATORY_CARE_PROVIDER_SITE_OTHER): Payer: Medicare Other | Admitting: Internal Medicine

## 2014-09-23 ENCOUNTER — Encounter: Payer: Self-pay | Admitting: Internal Medicine

## 2014-09-23 VITALS — BP 110/78 | HR 68 | Ht 68.0 in | Wt 171.6 lb

## 2014-09-23 DIAGNOSIS — Z8639 Personal history of other endocrine, nutritional and metabolic disease: Secondary | ICD-10-CM

## 2014-09-23 DIAGNOSIS — Z8579 Personal history of other malignant neoplasms of lymphoid, hematopoietic and related tissues: Secondary | ICD-10-CM

## 2014-09-23 NOTE — Progress Notes (Signed)
Patient ID: Brad May, male   DOB: [birth year], 74 y.o.   MRN: [id number] HPI: 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 Benay Spice who is seen in consult at the request of Dr. Laurance Flatten to establish care. This is his first visit with me though he was previously seen by Dr. Verl Blalock. He reports he is feeling well and his malignancy is felt to be in remission. He followed up with Dr. Benay Spice in July of this year.  He reports that he feels well. Energy levels are good. No GI complaints specifically no abdominal pain. Bowel habits are regular without blood in his stool or melena. Denies diarrhea or constipation. No heartburn, trouble swallowing, nausea or vomiting. Weight is stable and appetite has been good. No early satiety. No hepatobiliary complaint.  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  His last endoscopic exam was on 10/03/2011 by Dr. Sharlett Iles. It was a flexible sigmoidoscopy which showed normal colonic mucosa with no evidence of recurrent disease.  Past Medical History  Diagnosis Date  . Hyperlipidemia   . Non Hodgkin's lymphoma 11/27/2010  . BPH (benign prostatic hyperplasia)   . Periumbilical hernia     Past Surgical History  Procedure Laterality Date  . Appendectomy    . Inguinal hernia repair  2006    left  . Colonoscopy w/ polypectomy      Outpatient Prescriptions Prior to Visit  Medication Sig Dispense Refill  . aspirin 81 MG tablet Take 81 mg by mouth daily.      Marland Kitchen atorvastatin (LIPITOR) 80 MG tablet TAKE 1 TABLET BY MOUTH DAILY 30 tablet 6  . b complex vitamins tablet Take 1 tablet by mouth daily.      . fish oil-omega-3 fatty acids 1000 MG capsule Take 1 g by mouth daily.      Marland Kitchen LEVITRA 20 MG tablet TAKE 1 TABLET (20 MG TOTAL) BY MOUTH DAILY AS NEEDED FOR ERECTILE DYSFUNCTION. 10 tablet 0  . levothyroxine  (SYNTHROID, LEVOTHROID) 75 MCG tablet TAKE 1 TABLET BY MOUTH DAILY BEFORE BREAKFAST. 90 tablet 0  . niacin (NIASPAN) 500 MG CR tablet Take 1 tablet (500 mg total) by mouth at bedtime. 90 tablet 3  . sildenafil (REVATIO) 20 MG tablet Take 2- 5 tabs as needed for sexual activity. 50 tablet 1   No facility-administered medications prior to visit.    Allergies  Allergen Reactions  . Sulfonamide Derivatives     REACTION: unknown    Family History  Problem Relation Age of Onset  . Colon cancer Neg Hx   . Stomach cancer Neg Hx   . Heart disease Mother   . Heart disease Father     Social History  Substance Use Topics  . Smoking status: Never Smoker   . Smokeless tobacco: Never Used  . Alcohol Use: Yes     Comment: 1-2 month    ROS: As per history of present illness, otherwise negative  BP 110/78 mmHg  Pulse 68  Ht 5\' 8"  (1.727 m)  Wt 171 lb 9.6 oz (77.837 kg)  BMI 26.10 kg/m2 Constitutional: Well-developed and well-nourished. No distress. HEENT: Normocephalic and atraumatic. Oropharynx is clear and moist. No oropharyngeal exudate. Conjunctivae are normal.  No scleral icterus. Neck: Neck supple. Trachea midline. Cardiovascular: Normal rate, regular rhythm and intact distal pulses. No M/R/G Pulmonary/chest: Effort normal and breath  sounds normal. No wheezing, rales or rhonchi. Abdominal: Soft, nontender, nondistended. Bowel sounds active throughout. There are no masses palpable. No hepatosplenomegaly. Extremities: no clubbing, cyanosis, or edema Lymphadenopathy: No cervical adenopathy noted. Neurological: Alert and oriented to person place and time. Skin: Skin is warm and dry. No rashes noted. Psychiatric: Normal mood and affect. Behavior is normal.  RELEVANT LABS AND IMAGING: CBC    Component Value Date/Time   WBC 4.9 07/23/2014 1034   WBC 5.1 07/15/2014 0841   WBC 7.6 12/29/2013 1046   WBC 8.4 08/16/2010 1429   RBC 4.72 07/23/2014 1034   RBC 5.15 07/15/2014 0841    RBC 4.78 12/29/2013 1046   RBC 4.99 08/16/2010 1429   HGB 15.3 07/23/2014 1034   HGB 15.6 07/15/2014 0841   HGB 15.9 12/29/2013 1046   HCT 43.8 07/23/2014 1034   HCT 49.1 07/15/2014 0841   HCT 45.3 12/29/2013 1046   PLT 168 07/23/2014 1034   PLT 177 12/29/2013 1046   MCV 92.8 07/23/2014 1034   MCV 95.4 07/15/2014 0841   MCV 95 12/29/2013 1046   MCH 32.4 07/23/2014 1034   MCH 30.4 07/15/2014 0841   MCH 33.3* 12/29/2013 1046   MCHC 34.9 07/23/2014 1034   MCHC 31.8 07/15/2014 0841   MCHC 35.1 12/29/2013 1046   MCHC 33.0 08/16/2010 1429   RDW 12.8 07/23/2014 1034   RDW 14.1 12/29/2013 1046   RDW 14.9* 08/16/2010 1429   LYMPHSABS 2.1 07/23/2014 1034   LYMPHSABS 1.5 12/29/2013 1046   LYMPHSABS 2.6 08/16/2010 1429   MONOABS 0.6 07/23/2014 1034   MONOABS 0.8 08/16/2010 1429   EOSABS 0.2 07/23/2014 1034   EOSABS 0.3 08/16/2010 1429   BASOSABS 0.0 07/23/2014 1034   BASOSABS 0.0 12/29/2013 1046   BASOSABS 0.1 08/16/2010 1429    CMP     Component Value Date/Time   NA 140 07/15/2014 0836   NA 136 07/31/2011 1044   K 4.5 07/15/2014 0836   CL 102 07/15/2014 0836   CO2 21 07/15/2014 0836   GLUCOSE 113* 07/15/2014 0836   GLUCOSE 104* 07/31/2011 1044   BUN 15 07/15/2014 0836   BUN 14 07/31/2011 1044   CREATININE 0.90 07/15/2014 0836   CALCIUM 9.2 07/15/2014 0836   PROT 6.5 07/15/2014 0836   PROT 6.9 07/31/2011 1044   ALBUMIN 3.9 07/31/2011 1044   AST 25 07/15/2014 0836   ALT 27 07/15/2014 0836   ALKPHOS 100 07/15/2014 0836   BILITOT 0.8 07/15/2014 0836   BILITOT 0.9 12/29/2013 0949   GFRNONAA 84 07/15/2014 0836   GFRAA 97 07/15/2014 0836   GI and oncology records reviewed today  ASSESSMENT/PLAN: 74 yo male with 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 Benay Spice who is seen in consult at the request of Dr. Laurance Flatten to establish care.  1. Hx of marginal zone lymphoma involving the gut -- he is status  post treatment with bendamustine/rituximab and has achieved remission. There is been no evidence of recurrence. I discussed surveillance endoscopy with his oncologist Dr. Benay Spice. It was his recommendation that we not perform endoscopic surveillance unless he has bleeding or other symptoms to suggest recurrent lymphoma. Based on this recommendation we will not repeat colonoscopy at this time. This is discussed with the patient Norm Parcel, he understands the recommendation and agrees. He will follow-up with Dr. Benay Spice on a regular basis.  2. History of IDA -- this has resolved and hemoglobin remains normal  He  can be seen on an as-needed basis in my office, I'm available as needed. 25 minutes spent today with the encounter, greater than 50% with the patient discussing the above issues  KG:YJEHUD Jennette Bill, [address], [place] [id number]

## 2014-09-23 NOTE — Patient Instructions (Signed)
Please follow up as needed 

## 2014-10-21 ENCOUNTER — Ambulatory Visit: Payer: Medicare Other | Admitting: Pharmacist

## 2014-10-21 ENCOUNTER — Encounter: Payer: Self-pay | Admitting: Pharmacist

## 2014-10-21 ENCOUNTER — Ambulatory Visit (INDEPENDENT_AMBULATORY_CARE_PROVIDER_SITE_OTHER): Payer: Medicare Other | Admitting: Pharmacist

## 2014-10-21 VITALS — BP 124/80 | HR 78 | Ht 67.5 in | Wt 174.0 lb

## 2014-10-21 DIAGNOSIS — Z0000 Encounter for general adult medical examination without abnormal findings: Secondary | ICD-10-CM

## 2014-10-21 DIAGNOSIS — R739 Hyperglycemia, unspecified: Secondary | ICD-10-CM

## 2014-10-21 DIAGNOSIS — Z23 Encounter for immunization: Secondary | ICD-10-CM | POA: Diagnosis not present

## 2014-10-21 DIAGNOSIS — Z1212 Encounter for screening for malignant neoplasm of rectum: Secondary | ICD-10-CM

## 2014-10-21 LAB — POCT GLYCOSYLATED HEMOGLOBIN (HGB A1C): Hemoglobin A1C: 5.6

## 2014-10-21 LAB — GLUCOSE, POCT (MANUAL RESULT ENTRY): POC Glucose: 161 mg/dl — AB (ref 70–99)

## 2014-10-21 NOTE — Patient Instructions (Addendum)
Brad May , Thank you for taking time to come for your Medicare Wellness Visit. I appreciate your ongoing commitment to your health goals. Please review the following plan we discussed and let me know if I can assist you in the future.    This is a list of the screening recommended for you and due dates:  Health Maintenance  Topic Date Due  . Stool Blood Test  07/09/2014 - patient has at home - will return ASAP  . Flu Shot  08/16/2015  . Pneumonia vaccines (2 of 2 - PPSV23) completed  . Colon Cancer Screening  10/02/2021  . Tetanus Vaccine  10/15/2021  . Shingles Vaccine  Completed  *Topic was postponed. The date shown is not the original due date.    Health Maintenance, Male A healthy lifestyle and preventative care can promote health and wellness.  Maintain regular health, dental, and eye exams.  Eat a healthy diet. Foods like vegetables, fruits, whole grains, low-fat dairy products, and lean protein foods contain the nutrients you need and are low in calories. Decrease your intake of foods high in solid fats, added sugars, and salt. Get information about a proper diet from your health care provider, if necessary.  Regular physical exercise is one of the most important things you can do for your health. Most adults should get at least 150 minutes of moderate-intensity exercise (any activity that increases your heart rate and causes you to sweat) each week. In addition, most adults need muscle-strengthening exercises on 2 or more days a week.   Maintain a healthy weight. The body mass index (BMI) is a screening tool to identify possible weight problems. It provides an estimate of body fat based on height and weight. Your health care provider can find your BMI and can help you achieve or maintain a healthy weight. For males 20 years and older:  A BMI below 18.5 is considered underweight.  A BMI of 18.5 to 24.9 is normal.  A BMI of 25 to 29.9 is considered overweight.  A BMI of 30  and above is considered obese.  Maintain normal blood lipids and cholesterol by exercising and minimizing your intake of saturated fat. Eat a balanced diet with plenty of fruits and vegetables. Blood tests for lipids and cholesterol should begin at age 44 and be repeated every 5 years. If your lipid or cholesterol levels are high, you are over age 73, or you are at high risk for heart disease, you may need your cholesterol levels checked more frequently.Ongoing high lipid and cholesterol levels should be treated with medicines if diet and exercise are not working.  If you smoke, find out from your health care provider how to quit. If you do not use tobacco, do not start.  Lung cancer screening is recommended for adults aged 15-80 years who are at high risk for developing lung cancer because of a history of smoking. A yearly low-dose CT scan of the lungs is recommended for people who have at least a 30-pack-year history of smoking and are current smokers or have quit within the past 15 years. A pack year of smoking is smoking an average of 1 pack of cigarettes a day for 1 year (for example, a 30-pack-year history of smoking could mean smoking 1 pack a day for 30 years or 2 packs a day for 15 years). Yearly screening should continue until the smoker has stopped smoking for at least 15 years. Yearly screening should be stopped for people who develop  a health problem that would prevent them from having lung cancer treatment.  If you choose to drink alcohol, do not have more than 2 drinks per day. One drink is considered to be 12 oz (360 mL) of beer, 5 oz (150 mL) of wine, or 1.5 oz (45 mL) of liquor.  Avoid the use of street drugs. Do not share needles with anyone. Ask for help if you need support or instructions about stopping the use of drugs.  High blood pressure causes heart disease and increases the risk of stroke. High blood pressure is more likely to develop in:  People who have blood pressure in  the end of the normal range (100-139/85-89 mm Hg).  People who are overweight or obese.  People who are African American.  If you are 10-58 years of age, have your blood pressure checked every 3-5 years. If you are 70 years of age or older, have your blood pressure checked every year. You should have your blood pressure measured twice--once when you are at a hospital or clinic, and once when you are not at a hospital or clinic. Record the average of the two measurements. To check your blood pressure when you are not at a hospital or clinic, you can use:  An automated blood pressure machine at a pharmacy.  A home blood pressure monitor.  If you are 57-89 years old, ask your health care provider if you should take aspirin to prevent heart disease.  Diabetes screening involves taking a blood sample to check your fasting blood sugar level. This should be done once every 3 years after age 65 if you are at a normal weight and without risk factors for diabetes. Testing should be considered at a younger age or be carried out more frequently if you are overweight and have at least 1 risk factor for diabetes.  Colorectal cancer can be detected and often prevented. Most routine colorectal cancer screening begins at the age of 11 and continues through age 39. However, your health care provider may recommend screening at an earlier age if you have risk factors for colon cancer. On a yearly basis, your health care provider may provide home test kits to check for hidden blood in the stool. A small camera at the end of a tube may be used to directly examine the colon (sigmoidoscopy or colonoscopy) to detect the earliest forms of colorectal cancer. Talk to your health care provider about this at age 28 when routine screening begins. A direct exam of the colon should be repeated every 5-10 years through age 32, unless early forms of precancerous polyps or small growths are found.  People who are at an increased risk  for hepatitis B should be screened for this virus. You are considered at high risk for hepatitis B if:  You were born in a country where hepatitis B occurs often. Talk with your health care provider about which countries are considered high risk.  Your parents were born in a high-risk country and you have not received a shot to protect against hepatitis B (hepatitis B vaccine).  You have HIV or AIDS.  You use needles to inject street drugs.  You live with, or have sex with, someone who has hepatitis B.  You are a man who has sex with other men (MSM).  You get hemodialysis treatment.  You take certain medicines for conditions like cancer, organ transplantation, and autoimmune conditions.  Hepatitis C blood testing is recommended for all people born from  1945 through 1965 and any individual with known risk factors for hepatitis C.  Healthy men should no longer receive prostate-specific antigen (PSA) blood tests as part of routine cancer screening. Talk to your health care provider about prostate cancer screening.  Testicular cancer screening is not recommended for adolescents or adult males who have no symptoms. Screening includes self-exam, a health care provider exam, and other screening tests. Consult with your health care provider about any symptoms you have or any concerns you have about testicular cancer.  Practice safe sex. Use condoms and avoid high-risk sexual practices to reduce the spread of sexually transmitted infections (STIs).  You should be screened for STIs, including gonorrhea and chlamydia if:  You are sexually active and are younger than 24 years.  You are older than 24 years, and your health care provider tells you that you are at risk for this type of infection.  Your sexual activity has changed since you were last screened, and you are at an increased risk for chlamydia or gonorrhea. Ask your health care provider if you are at risk.  If you are at risk of being  infected with HIV, it is recommended that you take a prescription medicine daily to prevent HIV infection. This is called pre-exposure prophylaxis (PrEP). You are considered at risk if:  You are a man who has sex with other men (MSM).  You are a heterosexual man who is sexually active with multiple partners.  You take drugs by injection.  You are sexually active with a partner who has HIV.  Talk with your health care provider about whether you are at high risk of being infected with HIV. If you choose to begin PrEP, you should first be tested for HIV. You should then be tested every 3 months for as long as you are taking PrEP.  Use sunscreen. Apply sunscreen liberally and repeatedly throughout the day. You should seek shade when your shadow is shorter than you. Protect yourself by wearing long sleeves, pants, a wide-brimmed hat, and sunglasses year round whenever you are outdoors.  Tell your health care provider of new moles or changes in moles, especially if there is a change in shape or color. Also, tell your health care provider if a mole is larger than the size of a pencil eraser.  A one-time screening for abdominal aortic aneurysm (AAA) and surgical repair of large AAAs by ultrasound is recommended for men aged 72-75 years who are current or former smokers.  Stay current with your vaccines (immunizations).   This information is not intended to replace advice given to you by your health care provider. Make sure you discuss any questions you have with your health care provider.   Document Released: 06/30/2007 Document Revised: 01/22/2014 Document Reviewed: 05/29/2010 Elsevier Interactive Patient Education [employer].

## 2014-10-21 NOTE — Progress Notes (Signed)
Patient ID: Brad May, male   DOB: November 14, 1940, 74 y.o.   MRN: [id number]   Subjective:   Brad May is a 74 y.o. white male who presents for an Initial Medicare Annual Wellness Visit.  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  He is married and lives in [place], [place].   He has no medical complaints today.  In reviewing past labs I did notice that patient's last FBG was 113.    Current Medications (verified) Outpatient Encounter Prescriptions as of 10/21/2014  Medication Sig  . aspirin 81 MG tablet Take 81 mg by mouth daily.    Marland Kitchen atorvastatin (LIPITOR) 80 MG tablet TAKE 1 TABLET BY MOUTH DAILY  . b complex vitamins tablet Take 1 tablet by mouth daily.    . fish oil-omega-3 fatty acids 1000 MG capsule Take 1 g by mouth daily.    Marland Kitchen levothyroxine (SYNTHROID, LEVOTHROID) 75 MCG tablet TAKE 1 TABLET BY MOUTH DAILY BEFORE BREAKFAST.  Marland Kitchen niacin (NIASPAN) 500 MG CR tablet Take 1 tablet (500 mg total) by mouth at bedtime.  . sildenafil (REVATIO) 20 MG tablet Take 2- 5 tabs as needed for sexual activity.  . [DISCONTINUED] LEVITRA 20 MG tablet TAKE 1 TABLET (20 MG TOTAL) BY MOUTH DAILY AS NEEDED FOR ERECTILE DYSFUNCTION. (Patient not taking: Reported on 10/21/2014)   No facility-administered encounter medications on file as of 10/21/2014.    Allergies (verified) Sulfonamide derivatives   History: Past Medical History  Diagnosis Date  . Hyperlipidemia   . Non Hodgkin's lymphoma ([place]) 11/27/2010  . BPH (benign prostatic hyperplasia)   . Periumbilical hernia   . Thyroid disease   . Cataract    Past Surgical History  Procedure Laterality Date  . Appendectomy    . Inguinal hernia repair  2006    left  . Colonoscopy w/ polypectomy    . Eye surgery      drooping eyelids   Family History  Problem Relation Age of Onset  . Colon cancer Neg Hx   . Stomach cancer Neg Hx   . Heart disease Mother   . Heart failure Mother   . Heart disease Father   . Heart attack Father 43    Social History   Occupational History  . Retired high school principal   Social History Main Topics  . Smoking status: Never Smoker   . Smokeless tobacco: Never Used  . Alcohol Use: Yes     Comment: 1-2 month  . Drug Use: No  . Sexual Activity: Not on file    Do you feel safe at home?  Yes  Dietary issues and exercise activities discussed: Current Exercise Habits:: Home exercise routine, Type of exercise: walking;strength training/weights, Time (Minutes): 40, Frequency (Times/Week): 5, Weekly Exercise (Minutes/Week): 200, Intensity: Moderate  Patient reports he tried to get 12,000 steps per day.  Current Dietary habits:  Patient eats a variety of foods - lots of fruits and vegetables.  He limits high fat foods and because his wife is diabetic he limits sweets as well.    Cardiac Risk Factors include: advanced age (>50men, >36 women);dyslipidemia;male gender  Objective:    Today's Vitals   10/21/14 1614  BP: 124/80  Pulse: 78  Height: 5' 7.5" (1.715 m)  Weight: 174 lb (78.926 kg)  PainSc: 0-No pain   Body mass index is 26.83 kg/(m^2).   RBG = 161 today (was 1 hour post prandial) A1c = 5.6% today   Activities of Daily Living In your  present state of health, do you have any difficulty performing the following activities: 10/21/2014  Vision? N  Difficulty concentrating or making decisions? N  Walking or climbing stairs? N  Dressing or bathing? N  Doing errands, shopping? N  Preparing Food and eating ? N  Using the Toilet? N  In the past six months, have you accidently leaked urine? N  Do you have problems with loss of bowel control? N  Managing your Medications? N  Managing your Finances? N  Housekeeping or managing your Housekeeping? N    Are there smokers in your home (other than you)? No    Depression Screen PHQ 2/9 Scores 10/21/2014 07/15/2014 06/30/2013  PHQ - 2 Score 0 0 0    Fall Risk Fall Risk  10/21/2014 07/15/2014 10/21/2013 06/30/2013  Falls in the  past year? No No No No    Cognitive Function: MMSE - Mini Mental State Exam 10/21/2014  Orientation to time 4  Orientation to Place 5  Registration 3  Attention/ Calculation 5  Recall 3  Language- name 2 objects 2  Language- repeat 1  Language- follow 3 step command 3  Language- read & follow direction 1  Write a sentence 1  Copy design 1  Total score 29    Immunizations and Health Maintenance Immunization History  Administered Date(s) Administered  . Influenza,inj,Quad PF,36+ Mos 10/21/2013, 10/21/2014  . Pneumococcal Conjugate-13 12/17/2012   Health Maintenance Due  Topic Date Due  . COLON CANCER SCREENING ANNUAL FOBT  07/09/2014  . INFLUENZA VACCINE  08/16/2014    Patient Care Team: [name], MD as PCP - General (Family Medicine) Jerene Bears, MD as Consulting Physician (Gastroenterology) [name], MD as Consulting Physician (Oncology)  Indicate any recent Medical Services you may have received from other than [facility] providers in the past year (date may be approximate).    Assessment:    Annual Wellness Visit    Screening Tests Health Maintenance  Topic Date Due  . COLON CANCER SCREENING ANNUAL FOBT  07/09/2014  . INFLUENZA VACCINE  08/16/2014  . PNA vac Low Risk Adult (2 of 2 - PPSV23) 01/14/2015 (Originally 12/17/2013)  . COLONOSCOPY  10/02/2021  . TETANUS/TDAP  10/15/2021  . ZOSTAVAX  Completed      Plan:   During the course of the visit Rudolph was educated and counseled about the following appropriate screening and preventive services:   Vaccines to include Pneumoccal, Influenza, Hepatitis B, Td, Zostavax - received influenza vaccine today- all other vaccines UTD.  Colorectal cancer screening - colonoscopy is UTD; patient has FOBT at home - will return ASAP  Cardiovascular disease screening - BP at goal and lipids good - is taking statin + niacin  Diabetes screening -A1c was WNL today;  Will continue to monitor at least  yearly  Glaucoma screening /  Eye Exam - UTD - patient reports last eye exam about 3 month ago.  Nutrition counseling - patient is encouraged to continue to eat a variety of fruits and vegetables (at least 5 per day).  Limit processed and high fat foods.  Limit  Sugar containing beverage and foods.   Prostate cancer screening - UTd  Physical Activity - continue to walk and do gym workouts - great job with 12,000 steps per day!   Patient Instructions (the written plan) were given to the patient.   Cherre Robins, Alpha, CPP, CDE  10/21/2014

## 2014-12-17 DIAGNOSIS — Z1212 Encounter for screening for malignant neoplasm of rectum: Secondary | ICD-10-CM | POA: Diagnosis not present

## 2014-12-17 NOTE — Addendum Note (Signed)
Addended by: Pollyann Kennedy F on: 12/17/2014 12:05 PM   Modules accepted: Miquel Dunn

## 2014-12-17 NOTE — Addendum Note (Signed)
Addended by: Pollyann Kennedy F on: 12/17/2014 12:03 PM   Modules accepted: Miquel Dunn

## 2014-12-17 NOTE — Addendum Note (Signed)
Addended by: Pollyann Kennedy F on: 12/17/2014 12:08 PM   Modules accepted: Orders, SmartSet

## 2014-12-17 NOTE — Addendum Note (Signed)
Addended by: Pollyann Kennedy F on: 12/17/2014 12:07 PM   Modules accepted: Orders, SmartSet

## 2014-12-17 NOTE — Addendum Note (Signed)
Addended by: Pollyann Kennedy F on: 12/17/2014 12:03 PM   Modules accepted: Orders

## 2014-12-19 LAB — FECAL OCCULT BLOOD, IMMUNOCHEMICAL: Fecal Occult Bld: POSITIVE — AB

## 2015-01-03 ENCOUNTER — Ambulatory Visit (INDEPENDENT_AMBULATORY_CARE_PROVIDER_SITE_OTHER): Payer: Medicare Other | Admitting: Family Medicine

## 2015-01-03 ENCOUNTER — Encounter: Payer: Self-pay | Admitting: Family Medicine

## 2015-01-03 ENCOUNTER — Ambulatory Visit (INDEPENDENT_AMBULATORY_CARE_PROVIDER_SITE_OTHER): Payer: Medicare Other

## 2015-01-03 VITALS — BP 139/82 | HR 63 | Temp 96.9°F | Ht 67.5 in | Wt 177.0 lb

## 2015-01-03 DIAGNOSIS — Z0000 Encounter for general adult medical examination without abnormal findings: Secondary | ICD-10-CM

## 2015-01-03 DIAGNOSIS — E559 Vitamin D deficiency, unspecified: Secondary | ICD-10-CM | POA: Diagnosis not present

## 2015-01-03 DIAGNOSIS — E785 Hyperlipidemia, unspecified: Secondary | ICD-10-CM

## 2015-01-03 DIAGNOSIS — C8100 Nodular lymphocyte predominant Hodgkin lymphoma, unspecified site: Secondary | ICD-10-CM

## 2015-01-03 DIAGNOSIS — N400 Enlarged prostate without lower urinary tract symptoms: Secondary | ICD-10-CM

## 2015-01-03 DIAGNOSIS — K625 Hemorrhage of anus and rectum: Secondary | ICD-10-CM

## 2015-01-03 DIAGNOSIS — E039 Hypothyroidism, unspecified: Secondary | ICD-10-CM

## 2015-01-03 LAB — POCT URINALYSIS DIPSTICK
BILIRUBIN UA: NEGATIVE
GLUCOSE UA: NEGATIVE
Ketones, UA: NEGATIVE
LEUKOCYTES UA: NEGATIVE
NITRITE UA: NEGATIVE
PH UA: 7
Protein, UA: NEGATIVE
Spec Grav, UA: <=1.005
Urobilinogen, UA: NEGATIVE

## 2015-01-03 LAB — POCT UA - MICROSCOPIC ONLY
BACTERIA, U MICROSCOPIC: NEGATIVE
Casts, Ur, LPF, POC: NEGATIVE
Crystals, Ur, HPF, POC: NEGATIVE
MUCUS UA: NEGATIVE
WBC, UR, HPF, POC: NEGATIVE
Yeast, UA: NEGATIVE

## 2015-01-03 MED ORDER — SILDENAFIL CITRATE 20 MG PO TABS: ORAL_TABLET | ORAL | Status: DC

## 2015-01-03 MED ORDER — HYDROCORTISONE 2.5 % RE CREA: 1.0000 "application " | TOPICAL_CREAM | Freq: Two times a day (BID) | RECTAL | Status: DC

## 2015-01-03 NOTE — Progress Notes (Signed)
Subjective:    Patient ID: Brad May, male    DOB: 11-Mar-1940, 74 y.o.   MRN: [id number]  HPI Pt here for follow up and management of chronic medical problems which includes hyperlipidemia and hypothyroid. He is taking medications regularly. The patient's only complaint today is rectal pain. The patient says he may have seen a little bright red blood at times. The discomfort is asked to not pain but just some pressure like he needs to go to the bathroom. He has had one stool that was positive and has another fecal occult blood test card at home that he has not returned yet. He denies chest pain or shortness of breath. He swallowing without problems and has no heartburn indigestion and nausea vomiting diarrhea or black tarry stools. He's passing his water without problems and with generic Viagra does well with his erectile dysfunction. He saw the oncologist for his lymphoma of the GI tract in September and has a repeat visit scheduled this spring. He also saw Dr. Elmo Putt the gastroenterologist and he only has a return if needed appointment with him.      Patient Active Problem List   Diagnosis Date Noted  . 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 BPH (benign prostatic hyperplasia) 12/17/2012  . Hyperlipidemia 12/17/2012  . Hypothyroidism 12/17/2012  . Vitamin D deficiency 12/17/2012  . 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 Nonspecific (abnormal) findings on radiological and other examination of gastrointestinal tract 09/25/2010  . Colitis 09/25/2010  . Gastritis and duodenitis 09/25/2010  . Polyp of stomach 09/25/2010   Outpatient Encounter Prescriptions as of 01/03/2015  Medication Sig  . aspirin 81 MG tablet Take 81 mg by mouth daily.    Marland Kitchen atorvastatin (LIPITOR) 80 MG tablet TAKE 1 TABLET BY MOUTH DAILY  . b complex vitamins tablet Take 1 tablet by mouth daily.    . fish oil-omega-3 fatty acids 1000 MG capsule Take  1 g by mouth daily.    Marland Kitchen levothyroxine (SYNTHROID, LEVOTHROID) 75 MCG tablet TAKE 1 TABLET BY MOUTH DAILY BEFORE BREAKFAST.  Marland Kitchen niacin (NIASPAN) 500 MG CR tablet Take 1 tablet (500 mg total) by mouth at bedtime.  . sildenafil (REVATIO) 20 MG tablet Take 2- 5 tabs as needed for sexual activity.   No facility-administered encounter medications on file as of 01/03/2015.     Review of Systems  Constitutional: Negative.   HENT: Negative.   Eyes: Negative.   Respiratory: Negative.   Cardiovascular: Negative.   Gastrointestinal: Positive for rectal pain.  Endocrine: Negative.   Genitourinary: Negative.   Musculoskeletal: Negative.   Skin: Negative.   Allergic/Immunologic: Negative.   Neurological: Negative.   Hematological: Negative.   Psychiatric/Behavioral: Negative.        Objective:   Physical Exam  Constitutional: He is oriented to person, place, and time. He appears well-developed and well-nourished. No distress.  HENT:  Head: Normocephalic and atraumatic.  Right Ear: External ear normal.  Left Ear: External ear normal.  Mouth/Throat: Oropharynx is clear and moist. No oropharyngeal exudate.  Slight nasal congestion bilaterally left greater than right  Eyes: Conjunctivae and EOM are normal. Pupils are equal, round, and reactive to light. Right eye exhibits no discharge. Left eye exhibits no discharge. No scleral icterus.  The patient gets his eyes examined regularly.  Neck: Normal range of motion. Neck supple. No thyromegaly present.  There were no bruits thyromegaly or anterior cervical adenopathy  Cardiovascular: Normal rate, regular rhythm,  normal heart sounds and intact distal pulses.  Exam reveals no friction rub.   No murmur heard. The heart had a regular rate and rhythm at 60/m.  Pulmonary/Chest: Effort normal and breath sounds normal. No respiratory distress. He has no wheezes. He has no rales. He exhibits no tenderness.  The chest was clear anteriorly and posteriorly  and there is no axillary adenopathy.  Abdominal: Soft. Bowel sounds are normal. He exhibits no mass. There is no tenderness. There is no rebound and no guarding.  The abdomen was soft with out liver or spleen enlargement and no inguinal adenopathy. There was no abdominal tenderness and no masses.  Genitourinary: Rectum normal and penis normal.  The rectal exam was done today because of the patient noting some bright red blood in the stool. The prostate was enlarged but soft and smooth. There were no rectal masses palpable. There was a small ridge in the rectum which could be a small fissure but there was no blood on the glove after the rectal exam was done. There were no hernias palpable and the external genitalia were normal.  Musculoskeletal: Normal range of motion. He exhibits no edema or tenderness.  Lymphadenopathy:    He has no cervical adenopathy.  Neurological: He is alert and oriented to person, place, and time. He has normal reflexes. No cranial nerve deficit.  Skin: Skin is warm and dry. No rash noted.  Psychiatric: He has a normal mood and affect. His behavior is normal. Judgment and thought content normal.  Nursing note and vitals reviewed.  BP 139/82 mmHg  Pulse 63  Temp(Src) 96.9 F (36.1 C) (Oral)  Ht 5' 7.5" (1.715 m)  Wt 177 lb (80.287 kg)  BMI 27.30 kg/m2  [facility] reading (PRIMARY) by  Dr. Brunilda Payor x-ray--no active disease                                       Assessment & Plan:  1. Hyperlipidemia -Continue current treatment pending results of lab work and continue therapeutic lifestyle changes - DG Chest 2 View; Future - BMP8+EGFR - CBC with Differential/Platelet - Hepatic function panel - NMR, lipoprofile  2. Hypothyroidism, unspecified hypothyroidism type -Continue current treatment pending results of lab work - CBC with Differential/Platelet  3. Vitamin D deficiency -Continue vitamin D replacement pending results of lab work - CBC with  Differential/Platelet - VITAMIN D 25 Hydroxy (Vit-D Deficiency, Fractures)  4. BPH (benign prostatic hyperplasia) -The prostate was enlarged but the patient is not having any symptoms with this. - CBC with Differential/Platelet - PSA, total and free - POCT UA - Microscopic Only - POCT urinalysis dipstick  5. Health care maintenance -He will get a chest x-ray today and we will make sure that his oncologist gets a copy of this report - DG Chest 2 View; Future - BMP8+EGFR - Hepatic function panel  6. Nodular lymphocyte predominant Hodgkin lymphoma, unspecified body region [facility]) -Follow up with oncology as planned  7. Bright red blood per rectum -Follow up with gastroenterology as needed  Meds ordered this encounter  Medications  . hydrocortisone (ANUSOL-HC) 2.5 % rectal cream    Sig: Place 1 application rectally 2 (two) times daily.    Dispense:  30 g    Refill:  1  . DISCONTD: sildenafil (REVATIO) 20 MG tablet    Sig: Take 2- 5 tabs as needed for sexual activity.    Dispense:  50 tablet    Refill:  1  . sildenafil (REVATIO) 20 MG tablet    Sig: Take 2- 5 tabs as needed for sexual activity.    Dispense:  50 tablet    Refill:  2   Patient Instructions                       Medicare Annual Wellness Visit  [facility] and the medical providers at [facility] strive to bring you the best medical care.  In doing so we not only want to address your current medical conditions and concerns but also to detect new conditions early and prevent illness, disease and health-related problems.    Medicare offers a yearly Wellness Visit which allows our clinical staff to assess your need for preventative services including immunizations, lifestyle education, counseling to decrease risk of preventable diseases and screening for fall risk and other medical concerns.    This visit is provided free of charge (no copay) for all Medicare recipients. The clinical pharmacists  at [facility] have begun to conduct these Wellness Visits which will also include a thorough review of all your medications.    As you primary medical provider recommend that you make an appointment for your Annual Wellness Visit if you have not done so already this year.  You may set up this appointment before you leave today or you may call back ([phone]) and schedule an appointment.  Please make sure when you call that you mention that you are scheduling your Annual Wellness Visit with the clinical pharmacist so that the appointment may be made for the proper length of time.     Continue current medications. Continue good therapeutic lifestyle changes which include good diet and exercise. Fall precautions discussed with patient. If an FOBT was given today- please return it to our front desk. If you are over 1 years old - you may need Prevnar 30 or the adult Pneumonia vaccine.  **Flu shots are available--- please call and schedule a FLU-CLINIC appointment**  After your visit with [place] today you will receive a survey in the mail or online from [employer] regarding your care with [place]. Please take a moment to fill this out. Your feedback is very important to [place] as you can help [place] better understand your patient needs as well as improve your experience and satisfaction. WE CARE ABOUT YOU!!!   The patient should continue to follow-up with his oncologist , Dr. Benay Spice and do as needed follow-up with his gastroenterologist Dr. Hilarie Fredrickson The patient will wait about 1 week and return the FOBT and at that time start ProctoCream-HC twice daily for 1 week after that is completed he will do another FOBT about 3 weeks later. If the stools remain positive for blood or the hemoglobin has dropped we will arrange for him to see the gastroenterologist again sooner.   Arrie Senate MD

## 2015-01-03 NOTE — Patient Instructions (Addendum)
Medicare Annual Wellness Visit  [facility] and the medical providers at [facility] strive to bring you the best medical care.  In doing so we not only want to address your current medical conditions and concerns but also to detect new conditions early and prevent illness, disease and health-related problems.    Medicare offers a yearly Wellness Visit which allows our clinical staff to assess your need for preventative services including immunizations, lifestyle education, counseling to decrease risk of preventable diseases and screening for fall risk and other medical concerns.    This visit is provided free of charge (no copay) for all Medicare recipients. The clinical pharmacists at [facility] have begun to conduct these Wellness Visits which will also include a thorough review of all your medications.    As you primary medical provider recommend that you make an appointment for your Annual Wellness Visit if you have not done so already this year.  You may set up this appointment before you leave today or you may call back [phone] and schedule an appointment.  Please make sure when you call that you mention that you are scheduling your Annual Wellness Visit with the clinical pharmacist so that the appointment may be made for the proper length of time.     Continue current medications. Continue good therapeutic lifestyle changes which include good diet and exercise. Fall precautions discussed with patient. If an FOBT was given today- please return it to our front desk. If you are over 92 years old - you may need Prevnar 88 or the adult Pneumonia vaccine.  **Flu shots are available--- please call and schedule a FLU-CLINIC appointment**  After your visit with [place] today you will receive a survey in the mail or online from [employer] regarding your care with [place]. Please take a moment to fill this out. Your feedback is very  important to [place] as you can help [place] better understand your patient needs as well as improve your experience and satisfaction. WE CARE ABOUT YOU!!!   The patient should continue to follow-up with his oncologist , Dr. Benay Spice and do as needed follow-up with his gastroenterologist Dr. Hilarie Fredrickson The patient will wait about 1 week and return the FOBT and at that time start ProctoCream-HC twice daily for 1 week after that is completed he will do another FOBT about 3 weeks later. If the stools remain positive for blood or the hemoglobin has dropped we will arrange for him to see the gastroenterologist again sooner.

## 2015-01-04 LAB — BMP8+EGFR
BUN / CREAT RATIO: 13 (ref 10–22)
BUN: 11 mg/dL (ref 8–27)
CHLORIDE: 101 mmol/L (ref 96–106)
CO2: 24 mmol/L (ref 18–29)
Calcium: 9.4 mg/dL (ref 8.6–10.2)
Creatinine, Ser: 0.87 mg/dL (ref 0.76–1.27)
GFR calc Af Amer: 98 mL/min/{1.73_m2} (ref >59)
GFR calc non Af Amer: 84 mL/min/{1.73_m2} (ref >59)
GLUCOSE: 107 mg/dL — AB (ref 65–99)
Potassium: 4.6 mmol/L (ref 3.5–5.2)
SODIUM: 140 mmol/L (ref 134–144)

## 2015-01-04 LAB — CBC WITH DIFFERENTIAL/PLATELET
BASOS: 1 %
Basophils Absolute: 0 10*3/uL (ref 0.0–0.2)
EOS (ABSOLUTE): 0.2 10*3/uL (ref 0.0–0.4)
Eos: 4 %
Hematocrit: 46 % (ref 37.5–51.0)
Hemoglobin: 16.3 g/dL (ref 12.6–17.7)
IMMATURE GRANS (ABS): 0 10*3/uL (ref 0.0–0.1)
Immature Granulocytes: 0 %
Lymphocytes Absolute: 1.9 10*3/uL (ref 0.7–3.1)
Lymphs: 37 %
MCH: 32.7 pg (ref 26.6–33.0)
MCHC: 35.4 g/dL (ref 31.5–35.7)
MCV: 92 fL (ref 79–97)
MONOS ABS: 0.5 10*3/uL (ref 0.1–0.9)
Monocytes: 10 %
NEUTROS PCT: 48 %
Neutrophils Absolute: 2.5 10*3/uL (ref 1.4–7.0)
PLATELETS: 175 10*3/uL (ref 150–379)
RBC: 4.98 x10E6/uL (ref 4.14–5.80)
RDW: 12.5 % (ref 12.3–15.4)
WBC: 5.1 10*3/uL (ref 3.4–10.8)

## 2015-01-04 LAB — PSA, TOTAL AND FREE
PSA FREE PCT: 19.7 %
PSA, Free: 0.69 ng/mL
Prostate Specific Ag, Serum: 3.5 ng/mL (ref 0.0–4.0)

## 2015-01-04 LAB — NMR, LIPOPROFILE
CHOLESTEROL: 162 mg/dL (ref 100–199)
HDL CHOLESTEROL BY NMR: 43 mg/dL (ref >39)
HDL Particle Number: 29.1 umol/L — ABNORMAL LOW (ref >=30.5)
LDL Particle Number: 1418 nmol/L — ABNORMAL HIGH (ref <1000)
LDL Size: 20.4 nm (ref >20.5)
LDL-C: 100 mg/dL — ABNORMAL HIGH (ref 0–99)
LP-IR Score: 47 — ABNORMAL HIGH (ref <=45)
SMALL LDL PARTICLE NUMBER: 865 nmol/L — AB (ref <=527)
TRIGLYCERIDES BY NMR: 97 mg/dL (ref 0–149)

## 2015-01-04 LAB — VITAMIN D 25 HYDROXY (VIT D DEFICIENCY, FRACTURES): Vit D, 25-Hydroxy: 48.6 ng/mL (ref 30.0–100.0)

## 2015-01-04 LAB — HEPATIC FUNCTION PANEL
ALT: 34 IU/L (ref 0–44)
AST: 27 IU/L (ref 0–40)
Albumin: 4.3 g/dL (ref 3.5–4.8)
Alkaline Phosphatase: 109 IU/L (ref 39–117)
BILIRUBIN TOTAL: 0.6 mg/dL (ref 0.0–1.2)
Bilirubin, Direct: 0.16 mg/dL (ref 0.00–0.40)
Total Protein: 6.5 g/dL (ref 6.0–8.5)

## 2015-02-15 ENCOUNTER — Encounter: Payer: Self-pay | Admitting: Gastroenterology

## 2015-02-22 ENCOUNTER — Other Ambulatory Visit: Payer: Self-pay | Admitting: Family Medicine

## 2015-04-13 ENCOUNTER — Other Ambulatory Visit: Payer: Self-pay | Admitting: Nurse Practitioner

## 2015-04-25 ENCOUNTER — Telehealth: Payer: Self-pay | Admitting: *Deleted

## 2015-04-25 NOTE — Telephone Encounter (Signed)
Called patient to move his OV with Dr. Benay Spice from 4/13 to 4/26 at 0930/1000 at request of Dr. Benay Spice. Patient agreed.

## 2015-04-28 ENCOUNTER — Other Ambulatory Visit: Payer: BC Managed Care – PPO

## 2015-04-28 ENCOUNTER — Ambulatory Visit: Payer: BC Managed Care – PPO | Admitting: Oncology

## 2015-05-11 ENCOUNTER — Ambulatory Visit (HOSPITAL_BASED_OUTPATIENT_CLINIC_OR_DEPARTMENT_OTHER): Payer: Medicare Other | Admitting: Oncology

## 2015-05-11 ENCOUNTER — Telehealth: Payer: Self-pay | Admitting: Oncology

## 2015-05-11 ENCOUNTER — Other Ambulatory Visit (HOSPITAL_BASED_OUTPATIENT_CLINIC_OR_DEPARTMENT_OTHER): Payer: Medicare Other

## 2015-05-11 VITALS — BP 143/75 | HR 64 | Temp 98.0°F | Resp 18 | Ht 67.5 in | Wt 173.4 lb

## 2015-05-11 DIAGNOSIS — C8590 Non-Hodgkin lymphoma, unspecified, unspecified site: Secondary | ICD-10-CM

## 2015-05-11 DIAGNOSIS — M542 Cervicalgia: Secondary | ICD-10-CM | POA: Diagnosis not present

## 2015-05-11 DIAGNOSIS — Z8572 Personal history of non-Hodgkin lymphomas: Secondary | ICD-10-CM | POA: Diagnosis not present

## 2015-05-11 LAB — CBC WITH DIFFERENTIAL/PLATELET
BASO%: 0.6 % (ref 0.0–2.0)
BASOS ABS: 0 10*3/uL (ref 0.0–0.1)
EOS ABS: 0.2 10*3/uL (ref 0.0–0.5)
EOS%: 2.9 % (ref 0.0–7.0)
HEMATOCRIT: 46.5 % (ref 38.4–49.9)
HEMOGLOBIN: 15.7 g/dL (ref 13.0–17.1)
LYMPH#: 1.7 10*3/uL (ref 0.9–3.3)
LYMPH%: 23.6 % (ref 14.0–49.0)
MCH: 31.8 pg (ref 27.2–33.4)
MCHC: 33.8 g/dL (ref 32.0–36.0)
MCV: 94 fL (ref 79.3–98.0)
MONO#: 0.8 10*3/uL (ref 0.1–0.9)
MONO%: 11.1 % (ref 0.0–14.0)
NEUT#: 4.3 10*3/uL (ref 1.5–6.5)
NEUT%: 61.8 % (ref 39.0–75.0)
Platelets: 180 10*3/uL (ref 140–400)
RBC: 4.94 10*6/uL (ref 4.20–5.82)
RDW: 12.8 % (ref 11.0–14.6)
WBC: 7 10*3/uL (ref 4.0–10.3)

## 2015-05-11 NOTE — Telephone Encounter (Signed)
Gave and printed appt sched and avs for pt for Jan 2018 °

## 2015-05-11 NOTE — Progress Notes (Signed)
  [facility] OFFICE PROGRESS NOTE   Diagnosis: Marginal zone lymphoma  INTERVAL HISTORY:   Brad May returns as scheduled. He feels well. No current rectal bleeding. He reports an episode of bright red blood on toilet paper within the past year that he relates to "hemorrhoids ". He saw Dr. Hilarie Fredrickson last fall. Mr. Gilcrest reports a 1-2 week history of right-sided neck pain. No trauma. No arm weakness or numbness. The pain is localized to the posterior right neck. He wonders whether this could be related to lymphoma. He recently had a "Bridge "placed at the right upper mouth.  Objective:  Vital signs in last 24 hours:  Blood pressure 143/75, pulse 64, temperature 98 F (36.7 C), temperature source Oral, resp. rate 18, height 5' 7.5" (1.715 m), weight 173 lb 6.4 oz (78.654 kg), SpO2 99 %.    HEENT: Oropharynx without visible mass, neck without palpable mass Lymphatics: No cervical, supra-clavicular, axillary, or inguinal nodes Resp: Lungs clear bilaterally Cardio: Regular rate and rhythm GI: No hepatosplenomegaly, no mass, nontender Vascular: No leg edema Musculoskeletal: Mild tenderness at the medial right trapezius area at the level of C8, no mass  Neurologic: The motor exam is intact in the arms and hands bilaterally     Lab Results:  Lab Results  Component Value Date   WBC 7.0 05/11/2015   HGB 15.7 05/11/2015   HCT 46.5 05/11/2015   MCV 94.0 05/11/2015   PLT 180 05/11/2015   NEUTROABS 4.3 05/11/2015   Medications: I have reviewed the patient's current medications.  Assessment/Plan: 1. History of intermittent rectal bleeding.  2. 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  1. Repeat colonoscopy and upper endoscopy 10/05/2010 with the pathology confirming a B-cell malt lymphoma involving biopsies from the  stomach, duodenum, and colon.  2. CT of the abdomen and pelvis 08/04/2010 with abdominal/pelvic lymphadenopathy. 3. PET scan 09/13/2010 confirming mildly enlarged abdominal/pelvic lymph nodes with borderline increased FDG activity. 4. Initiation of single agent rituximab therapy on 11/28/2010, he completed 4 weekly treatments on 12/21/2010 5. 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 6. Cycle 1 bendamustine/rituximab 05/01/2011 7. Cycle 2 bendamustine/rituximab 05/29/2011 8. Cycle 3 bendamustine/rituximab 06/26/2011 9. Restaging PET scan 07/27/2011-no evidence of lymphoma 10. Restaging sigmoidoscopy 10/03/2011-normal colonic mucosa 3. History of iron deficiency. Ferritin was in the low normal range on 11/14/2010. Hemoglobin remains normal. 4. ? left upper lung nodule on a chest x-ray [id number] hypermetabolic lung nodule was seen on a PET scan 09/13/2010 5. Neck pain-likely secondary to a benign musculoskeletal condition   Disposition:  Mr. Janicki remains in clinical remission from non-Hodgkin's lymphoma. I doubt the neck pain is related to lymphoma. He will try rest and an anti-inflammatory for the next week. If the pain is not improved he will see Dr. Laurance Flatten. He may have cervical disc disease.  He will seek medical attention for recurrent rectal bleeding.  Mr. Elbert will return for an office visit and CBC in 9 months.    Betsy Coder, MD  05/11/2015  9:58 AM

## 2015-07-04 ENCOUNTER — Ambulatory Visit: Payer: Medicare Other | Admitting: Family Medicine

## 2015-07-05 ENCOUNTER — Encounter: Payer: Self-pay | Admitting: Family Medicine

## 2015-07-05 ENCOUNTER — Ambulatory Visit (INDEPENDENT_AMBULATORY_CARE_PROVIDER_SITE_OTHER): Payer: Medicare Other

## 2015-07-05 ENCOUNTER — Ambulatory Visit (INDEPENDENT_AMBULATORY_CARE_PROVIDER_SITE_OTHER): Payer: Medicare Other | Admitting: Family Medicine

## 2015-07-05 VITALS — BP 126/87 | HR 68 | Temp 97.2°F | Ht 67.5 in | Wt 169.8 lb

## 2015-07-05 DIAGNOSIS — S90912A Unspecified superficial injury of left ankle, initial encounter: Secondary | ICD-10-CM | POA: Diagnosis not present

## 2015-07-05 DIAGNOSIS — E559 Vitamin D deficiency, unspecified: Secondary | ICD-10-CM

## 2015-07-05 DIAGNOSIS — T148 Other injury of unspecified body region: Secondary | ICD-10-CM | POA: Diagnosis not present

## 2015-07-05 DIAGNOSIS — M436 Torticollis: Secondary | ICD-10-CM | POA: Diagnosis not present

## 2015-07-05 DIAGNOSIS — E785 Hyperlipidemia, unspecified: Secondary | ICD-10-CM

## 2015-07-05 DIAGNOSIS — N400 Enlarged prostate without lower urinary tract symptoms: Secondary | ICD-10-CM | POA: Diagnosis not present

## 2015-07-05 DIAGNOSIS — W57XXXA Bitten or stung by nonvenomous insect and other nonvenomous arthropods, initial encounter: Secondary | ICD-10-CM

## 2015-07-05 DIAGNOSIS — E039 Hypothyroidism, unspecified: Secondary | ICD-10-CM

## 2015-07-05 NOTE — Progress Notes (Signed)
Subjective:    Patient ID: Brad May, male    DOB: 1940/12/11, 75 y.o.   MRN: [id number]  HPI Patient is here today for a 6 month follow up on his hyperlipidemia and hypothyroidism. Patient also states that he has had a stiff neck on and off for 3- 4 months.This patient's history is significant in that he is had non-Hodgkin's lymphoma of the GI tract. He is followed regularly by the oncologist and everything is been stable with this. This is been going on with the neck pain for several months. He has trouble turning his head from one side to the other. He does not have any numbness or tingling in his extremities that seem to be associated with this. He does not recall any injuries. It is worrisome and bothersome. He sometimes will take an over-the-counter NSAID and this seems to help the problem. He has trouble turning his head from one side to the other. The patient denies any chest pain shortness of breath trouble swallowing heartburn indigestion nausea vomiting diarrhea or blood in the stool. The previous problem with blood in the stool got better after treating him with hemorrhoid cream. He doesn't recall and has had a couple of tick bites one in the groin area and one on the ankle. He is concerned about these because he had [facility] spotted fever in the past. He sees his oncologist every 8 months.   Review of Systems  Constitutional: Negative.   HENT: Negative.   Eyes: Negative.   Respiratory: Negative.   Cardiovascular: Negative.   Gastrointestinal: Negative.   Endocrine: Negative.   Genitourinary: Negative.   Musculoskeletal: Positive for neck stiffness.  Skin: Negative.   Allergic/Immunologic: Negative.   Neurological: Negative.   Hematological: Negative.   Psychiatric/Behavioral: Negative.     Patient Active Problem List   Diagnosis Date Noted  . 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 BPH (benign prostatic hyperplasia) 12/17/2012  .  Hyperlipidemia 12/17/2012  . Hypothyroidism 12/17/2012  . Vitamin D deficiency 12/17/2012  . 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 Nonspecific (abnormal) findings on radiological and other examination of gastrointestinal tract 09/25/2010  . Colitis 09/25/2010  . Gastritis and duodenitis 09/25/2010  . Polyp of stomach 09/25/2010   Outpatient Encounter Prescriptions as of 07/05/2015  Medication Sig  . aspirin 81 MG tablet Take 81 mg by mouth daily.    Marland Kitchen atorvastatin (LIPITOR) 80 MG tablet TAKE 1 TABLET BY MOUTH EVERY DAY  . b complex vitamins tablet Take 1 tablet by mouth daily.    . Cholecalciferol (VITAMIN D-1000 MAX ST) 1000 units tablet Take 1,000 Units by mouth daily.  . fish oil-omega-3 fatty acids 1000 MG capsule Take 1 g by mouth daily.    Marland Kitchen levothyroxine (SYNTHROID, LEVOTHROID) 75 MCG tablet TAKE 1 TABLET BY MOUTH DAILY BEFORE BREAKFAST.  Marland Kitchen niacin (NIASPAN) 500 MG CR tablet Take 1 tablet (500 mg total) by mouth at bedtime.  . sildenafil (REVATIO) 20 MG tablet Take 2- 5 tabs as needed for sexual activity.  . hydrocortisone (ANUSOL-HC) 2.5 % rectal cream Place 1 application rectally 2 (two) times daily. (Patient not taking: Reported on 07/05/2015)   No facility-administered encounter medications on file as of 07/05/2015.         Objective:   Physical Exam  Constitutional: He is oriented to person, place, and time. He appears well-developed and well-nourished. No distress.  HENT:  Head: Normocephalic and atraumatic.  Right Ear:  External ear normal.  Left Ear: External ear normal.  Nose: Nose normal.  Mouth/Throat: Oropharynx is clear and moist. No oropharyngeal exudate.  Eyes: Conjunctivae and EOM are normal. Pupils are equal, round, and reactive to light. Right eye exhibits no discharge. Left eye exhibits no discharge. No scleral icterus.  Neck: Normal range of motion. Neck supple. No tracheal deviation present. No thyromegaly present.    Some rigidity and pain in the neck with turning head from side to side. There is no radiating pain or numbness.  Cardiovascular: Normal rate, regular rhythm, normal heart sounds and intact distal pulses.   No murmur heard. Pulmonary/Chest: Effort normal and breath sounds normal. No respiratory distress. He has no wheezes. He has no rales.  No axillary adenopathy. Clear anteriorly and posteriorly  Abdominal: Soft. Bowel sounds are normal. He exhibits no mass. There is no tenderness. There is no rebound and no guarding.  No abdominal tenderness masses or organ enlargement or inguinal adenopathy.  Musculoskeletal: Normal range of motion. He exhibits no edema.  Lymphadenopathy:    He has no cervical adenopathy.  Neurological: He is alert and oriented to person, place, and time.  Skin: Skin is warm and dry. No rash noted.  Tick bite site in the groin area and in the left ankle area.  Psychiatric: He has a normal mood and affect. His behavior is normal. Judgment and thought content normal.  Nursing note and vitals reviewed.   BP 126/87 mmHg  Pulse 68  Temp(Src) 97.2 F (36.2 C) (Oral)  Ht 5' 7.5" (1.715 m)  Wt 169 lb 12.8 oz (77.021 kg)  BMI 26.19 kg/m2  [facility] reading (PRIMARY) by  Dr.Moore-C-spine films pending                                       Assessment & Plan:  1. Hypothyroidism, unspecified hypothyroidism type -Continue current treatment pending results of lab work - BMP8+EGFR - CBC with Differential/Platelet - Thyroid Panel With TSH  2. Hyperlipidemia -Continue current treatment and aggressive therapeutic lifestyle changes including diet and exercise pending results of lab work - BMP8+EGFR - CBC with Differential/Platelet - NMR, lipoprofile - Hepatic function panel  3. Vitamin D deficiency -Continue vitamin D replacement pending results of lab work - CBC with Differential/Platelet - VITAMIN D 25 Hydroxy (Vit-D Deficiency, Fractures)  4. BPH (benign prostatic  hyperplasia) -The patient has to get up once at nighttime to pass his water and has no discomfort with voiding.  5. Neck stiffness -This is becoming an increasing problem and occurring more frequently with limitations. -Take any anti-inflammatory medicine over-the-counter as directed - DG Cervical Spine Complete; Future  6. Tick bite - [place] spotted fvr abs pnl(IgG+IgM) - Lyme Ab/Western Blot Reflex  Patient Instructions  Check self more regularly for ticks Use deep [place] off Take anti-inflammatory medicine regularly twice daily for a couple weeks after breakfast and supper and then use prophylactically prior to golfing outings etc. Always take after eating and the medication bothers her stomach discontinue. Call us in 2-3 weeks and let us know how the neck pain is doing. If the neck pain is still a problem we may want to get an MRI at that time. Follow-up with oncologist as planned    Arrie Senate MD

## 2015-07-05 NOTE — Patient Instructions (Addendum)
Check self more regularly for ticks Use deep [place] off Take anti-inflammatory medicine regularly twice daily for a couple weeks after breakfast and supper and then use prophylactically prior to golfing outings etc. Always take after eating and the medication bothers her stomach discontinue. Call us in 2-3 weeks and let us know how the neck pain is doing. If the neck pain is still a problem we may want to get an MRI at that time. Follow-up with oncologist as planned

## 2015-07-06 LAB — THYROID PANEL WITH TSH
Free Thyroxine Index: 1.7 (ref 1.2–4.9)
T3 Uptake Ratio: 26 % (ref 24–39)
T4 TOTAL: 6.5 ug/dL (ref 4.5–12.0)
TSH: 4.16 u[IU]/mL (ref 0.450–4.500)

## 2015-07-06 LAB — BMP8+EGFR
BUN / CREAT RATIO: 18 (ref 10–24)
BUN: 15 mg/dL (ref 8–27)
CO2: 23 mmol/L (ref 18–29)
CREATININE: 0.83 mg/dL (ref 0.76–1.27)
Calcium: 9.7 mg/dL (ref 8.6–10.2)
Chloride: 102 mmol/L (ref 96–106)
GFR calc Af Amer: 100 mL/min/{1.73_m2} (ref >59)
GFR calc non Af Amer: 86 mL/min/{1.73_m2} (ref >59)
GLUCOSE: 90 mg/dL (ref 65–99)
Potassium: 4.3 mmol/L (ref 3.5–5.2)
Sodium: 139 mmol/L (ref 134–144)

## 2015-07-06 LAB — CBC WITH DIFFERENTIAL/PLATELET
Basophils Absolute: 0 10*3/uL (ref 0.0–0.2)
Basos: 1 %
EOS (ABSOLUTE): 0.1 10*3/uL (ref 0.0–0.4)
EOS: 2 %
HEMATOCRIT: 45.2 % (ref 37.5–51.0)
HEMOGLOBIN: 15.7 g/dL (ref 12.6–17.7)
IMMATURE GRANS (ABS): 0 10*3/uL (ref 0.0–0.1)
IMMATURE GRANULOCYTES: 0 %
LYMPHS: 34 %
Lymphocytes Absolute: 2.1 10*3/uL (ref 0.7–3.1)
MCH: 32.1 pg (ref 26.6–33.0)
MCHC: 34.7 g/dL (ref 31.5–35.7)
MCV: 92 fL (ref 79–97)
MONOCYTES: 9 %
Monocytes Absolute: 0.6 10*3/uL (ref 0.1–0.9)
NEUTROS PCT: 54 %
Neutrophils Absolute: 3.4 10*3/uL (ref 1.4–7.0)
Platelets: 202 10*3/uL (ref 150–379)
RBC: 4.89 x10E6/uL (ref 4.14–5.80)
RDW: 13.7 % (ref 12.3–15.4)
WBC: 6.2 10*3/uL (ref 3.4–10.8)

## 2015-07-06 LAB — NMR, LIPOPROFILE
CHOLESTEROL: 141 mg/dL (ref 100–199)
HDL CHOLESTEROL BY NMR: 38 mg/dL — AB (ref >39)
HDL PARTICLE NUMBER: 28.2 umol/L — AB (ref >=30.5)
LDL Particle Number: 1042 nmol/L — ABNORMAL HIGH (ref <1000)
LDL Size: 20.6 nm (ref >20.5)
LDL-C: 78 mg/dL (ref 0–99)
LP-IR Score: 49 — ABNORMAL HIGH (ref <=45)
Small LDL Particle Number: 355 nmol/L (ref <=527)
Triglycerides by NMR: 125 mg/dL (ref 0–149)

## 2015-07-06 LAB — LYME AB/WESTERN BLOT REFLEX

## 2015-07-06 LAB — ROCKY MTN SPOTTED FVR ABS PNL(IGG+IGM)
RMSF IGG: NEGATIVE
RMSF IGM: 0.4 {index} (ref 0.00–0.89)

## 2015-07-06 LAB — HEPATIC FUNCTION PANEL
ALBUMIN: 4.4 g/dL (ref 3.5–4.8)
ALT: 26 IU/L (ref 0–44)
AST: 31 IU/L (ref 0–40)
Alkaline Phosphatase: 111 IU/L (ref 39–117)
Bilirubin Total: 0.7 mg/dL (ref 0.0–1.2)
Bilirubin, Direct: 0.18 mg/dL (ref 0.00–0.40)
TOTAL PROTEIN: 6.8 g/dL (ref 6.0–8.5)

## 2015-07-06 LAB — VITAMIN D 25 HYDROXY (VIT D DEFICIENCY, FRACTURES): Vit D, 25-Hydroxy: 46.9 ng/mL (ref 30.0–100.0)

## 2015-08-06 ENCOUNTER — Other Ambulatory Visit: Payer: Self-pay | Admitting: Family Medicine

## 2015-08-10 DIAGNOSIS — H02402 Unspecified ptosis of left eyelid: Secondary | ICD-10-CM | POA: Diagnosis not present

## 2015-08-10 DIAGNOSIS — H00024 Hordeolum internum left upper eyelid: Secondary | ICD-10-CM | POA: Diagnosis not present

## 2015-08-10 DIAGNOSIS — H5213 Myopia, bilateral: Secondary | ICD-10-CM | POA: Diagnosis not present

## 2015-08-10 DIAGNOSIS — H25093 Other age-related incipient cataract, bilateral: Secondary | ICD-10-CM | POA: Diagnosis not present

## 2015-08-10 DIAGNOSIS — H524 Presbyopia: Secondary | ICD-10-CM | POA: Diagnosis not present

## 2015-08-10 DIAGNOSIS — H52223 Regular astigmatism, bilateral: Secondary | ICD-10-CM | POA: Diagnosis not present

## 2015-08-29 ENCOUNTER — Other Ambulatory Visit: Payer: Self-pay | Admitting: Family Medicine

## 2015-11-03 ENCOUNTER — Other Ambulatory Visit: Payer: Self-pay | Admitting: Family Medicine

## 2015-11-11 DIAGNOSIS — Z23 Encounter for immunization: Secondary | ICD-10-CM | POA: Diagnosis not present

## 2015-11-27 ENCOUNTER — Other Ambulatory Visit: Payer: Self-pay | Admitting: Family Medicine

## 2016-01-04 ENCOUNTER — Encounter: Payer: Self-pay | Admitting: Family Medicine

## 2016-01-04 ENCOUNTER — Ambulatory Visit (INDEPENDENT_AMBULATORY_CARE_PROVIDER_SITE_OTHER): Payer: Medicare Other | Admitting: Family Medicine

## 2016-01-04 VITALS — BP 126/84 | HR 67 | Temp 97.0°F | Ht 67.5 in | Wt 178.0 lb

## 2016-01-04 DIAGNOSIS — E7800 Pure hypercholesterolemia, unspecified: Secondary | ICD-10-CM

## 2016-01-04 DIAGNOSIS — N5201 Erectile dysfunction due to arterial insufficiency: Secondary | ICD-10-CM

## 2016-01-04 DIAGNOSIS — N400 Enlarged prostate without lower urinary tract symptoms: Secondary | ICD-10-CM | POA: Diagnosis not present

## 2016-01-04 DIAGNOSIS — C8280 Other types of follicular lymphoma, unspecified site: Secondary | ICD-10-CM

## 2016-01-04 DIAGNOSIS — E559 Vitamin D deficiency, unspecified: Secondary | ICD-10-CM | POA: Diagnosis not present

## 2016-01-04 LAB — MICROSCOPIC EXAMINATION
Bacteria, UA: NONE SEEN
EPITHELIAL CELLS (NON RENAL): NONE SEEN /HPF (ref 0–10)
RENAL EPITHEL UA: NONE SEEN /HPF
WBC UA: NONE SEEN /HPF (ref 0–?)

## 2016-01-04 LAB — URINALYSIS, COMPLETE
BILIRUBIN UA: NEGATIVE
Glucose, UA: NEGATIVE
KETONES UA: NEGATIVE
Leukocytes, UA: NEGATIVE
NITRITE UA: NEGATIVE
PH UA: 6 (ref 5.0–7.5)
Protein, UA: NEGATIVE
SPEC GRAV UA: 1.01 (ref 1.005–1.030)
UUROB: 0.2 mg/dL (ref 0.2–1.0)

## 2016-01-04 NOTE — Progress Notes (Signed)
Subjective:    Patient ID: Brad May, male    DOB: 03-30-1940, 75 y.o.   MRN: [id number]  HPI Pt here for follow up and management of chronic medical problems which includes hypothyroid and hyperlipidemia. He is taking medications regularly.The patient is pleasant and alert. He denies any chest pain pressure or palpitations. He says he's had a stress test in the past couple of years. He denies any shortness of breath. He is doing well with his stomach and not having any problems other than some occasional bright red blood which he thinks is hemorrhoid related. He is voiding without problems and is still taking generic Viagra for his erectile dysfunction. He had an eye exam in July. 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 He will discuss with his oncologist when he needs the next colonoscopy and he will hopefully make arrangements with Dr. Elmo Putt to have this done at the appropriate time. The patient says he has not been exercising as much as he should have and he is gaining 5 pounds of weight. He did have some increased myalgias and reduce his Lipitor to 40 mg and these myalgias seem to get better. His family history was reviewed and his mother died in her 50s of congestive heart failure and his father had a heart attack and was a smoker at 76 years of age. He has an older brother and a younger sister and both of these siblings are in good health. A shunt is pleasant and alert.     Patient Active Problem List   Diagnosis Date Noted  . 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 BPH (benign prostatic hyperplasia) 12/17/2012  . Hyperlipidemia 12/17/2012  . Hypothyroidism 12/17/2012  . Vitamin D deficiency 12/17/2012  . 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 Nonspecific (abnormal) findings on radiological and other examination of gastrointestinal tract 09/25/2010    . Colitis 09/25/2010  . Gastritis and duodenitis 09/25/2010  . Polyp of stomach 09/25/2010   Outpatient Encounter Prescriptions as of 01/04/2016  Medication Sig  . aspirin 81 MG tablet Take 81 mg by mouth daily.    Marland Kitchen atorvastatin (LIPITOR) 80 MG tablet TAKE 1 TABLET BY MOUTH EVERY DAY  . b complex vitamins tablet Take 1 tablet by mouth daily.    . Cholecalciferol (VITAMIN D-1000 MAX ST) 1000 units tablet Take 1,000 Units by mouth daily.  . fish oil-omega-3 fatty acids 1000 MG capsule Take 1 g by mouth daily.    . hydrocortisone (ANUSOL-HC) 2.5 % rectal cream Place 1 application rectally 2 (two) times daily.  Marland Kitchen levothyroxine (SYNTHROID, LEVOTHROID) 75 MCG tablet TAKE 1 TABLET BY MOUTH DAILY BEFORE BREAKFAST.  Marland Kitchen niacin (NIASPAN) 500 MG CR tablet TAKE 1 TABLET (500 MG TOTAL) BY MOUTH AT BEDTIME.  . sildenafil (REVATIO) 20 MG tablet Take 2- 5 tabs as needed for sexual activity.   No facility-administered encounter medications on file as of 01/04/2016.      Review of Systems  Constitutional: Positive for unexpected weight change (gain).  HENT: Negative.   Eyes: Negative.   Respiratory: Negative.   Cardiovascular: Negative.   Gastrointestinal: Negative.   Endocrine: Negative.   Genitourinary: Negative.   Musculoskeletal: Positive for myalgias.  Skin: Negative.   Allergic/Immunologic: Negative.   Neurological: Negative.   Hematological: Negative.   Psychiatric/Behavioral: Negative.        Objective:   Physical Exam  Constitutional:  He is oriented to person, place, and time. He appears well-developed and well-nourished. No distress.  HENT:  Head: Normocephalic and atraumatic.  Right Ear: External ear normal.  Left Ear: External ear normal.  Nose: Nose normal.  Mouth/Throat: Oropharynx is clear and moist. No oropharyngeal exudate.  Eyes: Conjunctivae and EOM are normal. Pupils are equal, round, and reactive to light. Right eye exhibits no discharge. Left eye exhibits no  discharge. No scleral icterus.  Neck: Normal range of motion. Neck supple. No thyromegaly present.  No bruits thyromegaly or anterior cervical adenopathy  Cardiovascular: Normal rate, regular rhythm, normal heart sounds and intact distal pulses.   No murmur heard. The heart has a regular rate and rhythm at 72/m  Pulmonary/Chest: Effort normal and breath sounds normal. No respiratory distress. He has no wheezes. He has no rales. He exhibits no tenderness.  Lungs are clear anteriorly and posteriorly No axillary adenopathy  Abdominal: Soft. Bowel sounds are normal. He exhibits no mass. There is no tenderness. There is no rebound and no guarding.  No liver or spleen enlargement no inguinal adenopathy no masses no tenderness  Genitourinary: Rectum normal and penis normal.  Genitourinary Comments: The prostate was enlarged but soft and smooth without masses. There were no rectal masses. There is no inguinal hernias present. External genitalia were within normal limits.  Musculoskeletal: Normal range of motion. He exhibits no edema.  Lymphadenopathy:    He has no cervical adenopathy.  Neurological: He is alert and oriented to person, place, and time. He has normal reflexes. No cranial nerve deficit.  Skin: Skin is warm and dry. No rash noted.  Dry skin  Psychiatric: He has a normal mood and affect. His behavior is normal. Judgment and thought content normal.  Nursing note and vitals reviewed.  BP (!) 147/89 (BP Location: Left Arm)   Pulse 67   Temp 97 F (36.1 C) (Oral)   Ht 5' 7.5" (1.715 m)   Wt 178 lb (80.7 kg)   BMI 27.47 kg/m   Repeat blood pressure was 126/84 in the right arm sitting with a regular cuff      Assessment & Plan:  1. Pure hypercholesterolemia -The patient reduced his atorvastatin to 40 mg daily because he is having some myalgias which seemed to get better after he reduced the atorvastatin. Unfortunately has gained some weight because of lack of exercise. He will try  to do better with weight loss and diet. We will call with results of the lab work as soon as it becomes available - BMP8+EGFR - CBC with Differential/Platelet - NMR, lipoprofile - Hepatic function panel - EKG 12-Lead  2. Vitamin D deficiency -Continue current treatment pending results of lab work - CBC with Differential/Platelet - VITAMIN D 25 Hydroxy (Vit-D Deficiency, Fractures)  3. Benign prostatic hyperplasia, unspecified whether lower urinary tract symptoms present -The prostate is enlarged but soft and smooth with no lumps or masses and no rectal masses and the patient is not having any symptoms with voiding. - CBC with Differential/Platelet - Urinalysis, Complete - PSA, total and free  4. Other type of follicular lymphoma, unspecified body region [facility]) -The patient has non-Hodgkin's lymphoma and is being followed regularly by Dr. Hillery Aldo some time in January. We would ask that he discuss with him whether or not he is in need of another colonoscopy. We would ask that Dr. Benay Spice discuss this also with his gastroenterologist Dr. Elmo Putt. - BMP8+EGFR - CBC with Differential/Platelet  5. Erectile dysfunction due to arterial  insufficiency -We will refill his generic Viagra. - CBC with Differential/Platelet  6. Benign prostatic hyperplasia without lower urinary tract symptoms -The patient is having no problems with voiding. - CBC with Differential/Platelet  No orders of the defined types were placed in this encounter.   Patient Instructions                       Medicare Annual Wellness Visit  [facility] and the medical providers at [facility] strive to bring you the best medical care.  In doing so we not only want to address your current medical conditions and concerns but also to detect new conditions early and prevent illness, disease and health-related problems.    Medicare offers a yearly Wellness Visit which allows our clinical staff to assess  your need for preventative services including immunizations, lifestyle education, counseling to decrease risk of preventable diseases and screening for fall risk and other medical concerns.    This visit is provided free of charge (no copay) for all Medicare recipients. The clinical pharmacists at [facility] have begun to conduct these Wellness Visits which will also include a thorough review of all your medications.    As you primary medical provider recommend that you make an appointment for your Annual Wellness Visit if you have not done so already this year.  You may set up this appointment before you leave today or you may call back [phone]) and schedule an appointment.  Please make sure when you call that you mention that you are scheduling your Annual Wellness Visit with the clinical pharmacist so that the appointment may be made for the proper length of time.     Continue current medications. Continue good therapeutic lifestyle changes which include good diet and exercise. Fall precautions discussed with patient. If an FOBT was given today- please return it to our front desk. If you are over 56 years old - you may need Prevnar 48 or the adult Pneumonia vaccine.  **Flu shots are available--- please call and schedule a FLU-CLINIC appointment**  After your visit with [place] today you will receive a survey in the mail or online from [employer] regarding your care with [place]. Please take a moment to fill this out. Your feedback is very important to [place] as you can help [place] better understand your patient needs as well as improve your experience and satisfaction. WE CARE ABOUT YOU!!!   The patient should follow-up with his oncologist in January as planned He should discuss with the oncologist whether or not he is in need of another colonoscopy by the gastroenterologist, Dr. Elmo Putt. He should return the FOBT He should work aggressively on getting more exercise and watching his  diet more closely and losing the weight he has gained He should drink more water and stay well hydrated We will plan to do another stress test in the next couple of years because of the strong family history of heart disease Patient is encouraged to use scent free fabric softeners, detergents that are dermatology approved like all and mild soaps that are scent free    Arrie Senate MD

## 2016-01-04 NOTE — Patient Instructions (Addendum)
Medicare Annual Wellness Visit  [facility] and the medical providers at [facility] strive to bring you the best medical care.  In doing so we not only want to address your current medical conditions and concerns but also to detect new conditions early and prevent illness, disease and health-related problems.    Medicare offers a yearly Wellness Visit which allows our clinical staff to assess your need for preventative services including immunizations, lifestyle education, counseling to decrease risk of preventable diseases and screening for fall risk and other medical concerns.    This visit is provided free of charge (no copay) for all Medicare recipients. The clinical pharmacists at [facility] have begun to conduct these Wellness Visits which will also include a thorough review of all your medications.    As you primary medical provider recommend that you make an appointment for your Annual Wellness Visit if you have not done so already this year.  You may set up this appointment before you leave today or you may call back [phone] and schedule an appointment.  Please make sure when you call that you mention that you are scheduling your Annual Wellness Visit with the clinical pharmacist so that the appointment may be made for the proper length of time.     Continue current medications. Continue good therapeutic lifestyle changes which include good diet and exercise. Fall precautions discussed with patient. If an FOBT was given today- please return it to our front desk. If you are over 76 years old - you may need Prevnar 81 or the adult Pneumonia vaccine.  **Flu shots are available--- please call and schedule a FLU-CLINIC appointment**  After your visit with [place] today you will receive a survey in the mail or online from [employer] regarding your care with [place]. Please take a moment to fill this out. Your feedback is very  important to [place] as you can help [place] better understand your patient needs as well as improve your experience and satisfaction. WE CARE ABOUT YOU!!!   The patient should follow-up with his oncologist in January as planned He should discuss with the oncologist whether or not he is in need of another colonoscopy by the gastroenterologist, Dr. Elmo Putt. He should return the FOBT He should work aggressively on getting more exercise and watching his diet more closely and losing the weight he has gained He should drink more water and stay well hydrated We will plan to do another stress test in the next couple of years because of the strong family history of heart disease Patient is encouraged to use scent free fabric softeners, detergents that are dermatology approved like all and mild soaps that are scent free

## 2016-01-05 LAB — HEPATIC FUNCTION PANEL
ALT: 31 IU/L (ref 0–44)
AST: 26 IU/L (ref 0–40)
Albumin: 4.1 g/dL (ref 3.5–4.8)
Alkaline Phosphatase: 106 IU/L (ref 39–117)
Bilirubin Total: 0.8 mg/dL (ref 0.0–1.2)
Bilirubin, Direct: 0.18 mg/dL (ref 0.00–0.40)
TOTAL PROTEIN: 6.7 g/dL (ref 6.0–8.5)

## 2016-01-05 LAB — CBC WITH DIFFERENTIAL/PLATELET
BASOS ABS: 0.1 10*3/uL (ref 0.0–0.2)
Basos: 1 %
EOS (ABSOLUTE): 0.3 10*3/uL (ref 0.0–0.4)
Eos: 5 %
HEMOGLOBIN: 15.3 g/dL (ref 13.0–17.7)
Hematocrit: 43.5 % (ref 37.5–51.0)
Immature Grans (Abs): 0 10*3/uL (ref 0.0–0.1)
Immature Granulocytes: 0 %
LYMPHS ABS: 1.9 10*3/uL (ref 0.7–3.1)
Lymphs: 32 %
MCH: 31.9 pg (ref 26.6–33.0)
MCHC: 35.2 g/dL (ref 31.5–35.7)
MCV: 91 fL (ref 79–97)
Monocytes Absolute: 0.7 10*3/uL (ref 0.1–0.9)
Monocytes: 12 %
NEUTROS ABS: 2.9 10*3/uL (ref 1.4–7.0)
Neutrophils: 50 %
Platelets: 169 10*3/uL (ref 150–379)
RBC: 4.8 x10E6/uL (ref 4.14–5.80)
RDW: 13.2 % (ref 12.3–15.4)
WBC: 5.8 10*3/uL (ref 3.4–10.8)

## 2016-01-05 LAB — PSA, TOTAL AND FREE
PROSTATE SPECIFIC AG, SERUM: 2.9 ng/mL (ref 0.0–4.0)
PSA FREE PCT: 21 %
PSA, Free: 0.61 ng/mL

## 2016-01-05 LAB — BMP8+EGFR
BUN / CREAT RATIO: 10 (ref 10–24)
BUN: 8 mg/dL (ref 8–27)
CHLORIDE: 101 mmol/L (ref 96–106)
CO2: 27 mmol/L (ref 18–29)
Calcium: 9.1 mg/dL (ref 8.6–10.2)
Creatinine, Ser: 0.82 mg/dL (ref 0.76–1.27)
GFR calc non Af Amer: 86 mL/min/{1.73_m2} (ref >59)
GFR, EST AFRICAN AMERICAN: 99 mL/min/{1.73_m2} (ref >59)
Glucose: 103 mg/dL — ABNORMAL HIGH (ref 65–99)
POTASSIUM: 4 mmol/L (ref 3.5–5.2)
Sodium: 141 mmol/L (ref 134–144)

## 2016-01-05 LAB — VITAMIN D 25 HYDROXY (VIT D DEFICIENCY, FRACTURES): VIT D 25 HYDROXY: 38.3 ng/mL (ref 30.0–100.0)

## 2016-01-05 LAB — NMR, LIPOPROFILE
Cholesterol: 163 mg/dL (ref 100–199)
HDL CHOLESTEROL BY NMR: 43 mg/dL (ref >39)
HDL Particle Number: 29.9 umol/L — ABNORMAL LOW (ref >=30.5)
LDL PARTICLE NUMBER: 1170 nmol/L — AB (ref <1000)
LDL Size: 20.5 nm (ref >20.5)
LDL-C: 93 mg/dL (ref 0–99)
LP-IR Score: 57 — ABNORMAL HIGH (ref <=45)
SMALL LDL PARTICLE NUMBER: 614 nmol/L — AB (ref <=527)
TRIGLYCERIDES BY NMR: 136 mg/dL (ref 0–149)

## 2016-01-10 ENCOUNTER — Other Ambulatory Visit: Payer: Self-pay | Admitting: Nurse Practitioner

## 2016-02-09 ENCOUNTER — Other Ambulatory Visit (HOSPITAL_BASED_OUTPATIENT_CLINIC_OR_DEPARTMENT_OTHER): Payer: Medicare Other

## 2016-02-09 ENCOUNTER — Ambulatory Visit (HOSPITAL_BASED_OUTPATIENT_CLINIC_OR_DEPARTMENT_OTHER): Payer: Medicare Other | Admitting: Oncology

## 2016-02-09 ENCOUNTER — Telehealth: Payer: Self-pay | Admitting: Oncology

## 2016-02-09 VITALS — BP 134/82 | HR 64 | Temp 97.6°F | Resp 18 | Ht 67.5 in | Wt 174.7 lb

## 2016-02-09 DIAGNOSIS — C8590 Non-Hodgkin lymphoma, unspecified, unspecified site: Secondary | ICD-10-CM

## 2016-02-09 DIAGNOSIS — Z8571 Personal history of Hodgkin lymphoma: Secondary | ICD-10-CM

## 2016-02-09 LAB — CBC WITH DIFFERENTIAL/PLATELET
BASO%: 0.8 % (ref 0.0–2.0)
BASOS ABS: 0 10*3/uL (ref 0.0–0.1)
EOS ABS: 0.2 10*3/uL (ref 0.0–0.5)
EOS%: 4.2 % (ref 0.0–7.0)
HCT: 45.4 % (ref 38.4–49.9)
HEMOGLOBIN: 16.1 g/dL (ref 13.0–17.1)
LYMPH#: 1.7 10*3/uL (ref 0.9–3.3)
LYMPH%: 31.7 % (ref 14.0–49.0)
MCH: 32.3 pg (ref 27.2–33.4)
MCHC: 35.5 g/dL (ref 32.0–36.0)
MCV: 91.2 fL (ref 79.3–98.0)
MONO#: 0.5 10*3/uL (ref 0.1–0.9)
MONO%: 9.4 % (ref 0.0–14.0)
NEUT#: 2.8 10*3/uL (ref 1.5–6.5)
NEUT%: 53.9 % (ref 39.0–75.0)
PLATELETS: 164 10*3/uL (ref 140–400)
RBC: 4.98 10*6/uL (ref 4.20–5.82)
RDW: 12.5 % (ref 11.0–14.6)
WBC: 5.2 10*3/uL (ref 4.0–10.3)
nRBC: 0 % (ref 0–0)

## 2016-02-09 NOTE — Progress Notes (Signed)
  Welcome OFFICE PROGRESS NOTE   Diagnosis: Non-Hodgkin's lymphoma  INTERVAL HISTORY:   Brad May returns as scheduled. He feels well. No rectal bleeding in greater than a year. No complaint.  Objective:  Vital signs in last 24 hours:  Blood pressure 134/82, pulse 64, temperature 97.6 F (36.4 C), temperature source Oral, resp. rate 18, height 5' 7.5" (1.715 m), weight 174 lb 11.2 oz (79.2 kg), SpO2 100 %.    HEENT: Neck without mass Lymphatics: No cervical, supraclavicular, axillary, or inguinal nodes Resp: Lungs clear bilaterally Cardio: Regular rate and rhythm GI: No hepatomegaly, no mass Vascular: No leg edema   Lab Results:  Lab Results  Component Value Date   WBC 5.2 02/09/2016   HGB 16.1 02/09/2016   HCT 45.4 02/09/2016   MCV 91.2 02/09/2016   PLT 164 02/09/2016   NEUTROABS 2.8 02/09/2016     Medications: I have reviewed the patient's current medications.  Assessment/Plan: 1. History of intermittent rectal bleeding.  2. 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  1. 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  2. CT of the abdomen and pelvis 08/04/2010 with abdominal/pelvic lymphadenopathy. 3. PET scan 09/13/2010 confirming mildly enlarged abdominal/pelvic lymph nodes with borderline increased FDG activity. 4. Initiation of single agent rituximab therapy on 11/28/2010, he completed 4 weekly treatments on 12/21/2010 5. 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 6. Cycle 1 bendamustine/rituximab 05/01/2011 7. Cycle 2 bendamustine/rituximab 05/29/2011 8. Cycle 3  bendamustine/rituximab 06/26/2011 9. Restaging PET scan 07/27/2011-no evidence of lymphoma 10. Restaging sigmoidoscopy 10/03/2011-normal colonic mucosa 3. History of iron deficiency. Ferritin was in the low normal range on 11/14/2010. Hemoglobin remains normal. 4. ? left upper lung nodule on a chest x-ray [id number] hypermetabolic lung nodule was seen on a PET scan 09/13/2010 5. Neck pain-likely secondary to a benign musculoskeletal condition    Disposition:  Brad May remains in clinical remission from the marginal zone lymphoma. He will return for an office visit and CBC in 9 months. He will continue surveillance colonoscopy for colon cancer with Dr.Pyrtle.  15 minutes were spent with the patient today. The majority of the time was used for counseling and correlation of care.   Betsy Coder, MD  02/09/2016  10:28 AM

## 2016-02-09 NOTE — Telephone Encounter (Signed)
Appointment scheduled per 02/09/16 los. Patient was given a copy of the AVS report and appointment schedule per 02/09/16 los. °

## 2016-02-09 NOTE — Telephone Encounter (Signed)
Appointments scheduled for 02/09/16 los. Patient was given a copy of the appointment schedule and AVS report,per 02/09/16 los.

## 2016-02-28 ENCOUNTER — Other Ambulatory Visit: Payer: Self-pay | Admitting: Family Medicine

## 2016-04-11 ENCOUNTER — Other Ambulatory Visit: Payer: Self-pay | Admitting: Family Medicine

## 2016-05-25 ENCOUNTER — Ambulatory Visit (INDEPENDENT_AMBULATORY_CARE_PROVIDER_SITE_OTHER): Payer: Medicare Other | Admitting: *Deleted

## 2016-05-25 ENCOUNTER — Other Ambulatory Visit: Payer: Self-pay | Admitting: Family Medicine

## 2016-05-25 VITALS — BP 121/74 | HR 62 | Temp 97.2°F | Ht 68.0 in | Wt 175.0 lb

## 2016-05-25 DIAGNOSIS — Z0000 Encounter for general adult medical examination without abnormal findings: Secondary | ICD-10-CM | POA: Diagnosis not present

## 2016-05-25 NOTE — Patient Instructions (Signed)
  [name] , Thank you for taking time to come for your Medicare Wellness Visit. I appreciate your ongoing commitment to your health goals. Please review the following plan we discussed and let me know if I can assist you in the future.   These are the goals we discussed: Goals    . Increase physical activity          Would like to start running again- 5K    . Weight (lb) < 170 lb (77.1 kg)       This is a list of the screening recommended for you and due dates:  Health Maintenance  Topic Date Due  . Pneumonia vaccines (2 of 2 - PPSV23) 06/03/2016*  . Flu Shot  08/15/2016  . Tetanus Vaccine  10/15/2021  *Topic was postponed. The date shown is not the original due date.    Keep follow up appt with Dr Laurance Flatten on 07/04/16 at 8:30am and other Specialist. Bring in a copy of your advanced directives

## 2016-05-25 NOTE — Progress Notes (Signed)
Subjective:   Brad May is a 76 y.o. male who presents for Medicare Annual/Subsequent preventive examination. He is a very active man whom has and still does a lot for his community. 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 He retired from here in [place], [employer]. He is still on the Board of education directors in [place] and he also has been on the community college BOD. 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. This is a program for indigent families that helps with utility payment, counseling and housing. He also attends church on Sundays. He is married and lives at home with his wife. He has a son that lives close by. He had a daughter to commit suicide. He does not have any pets and he is aware of fall hazards in the home. He attends the [employer] in [place] and he also plays golf and walks regularly. He states his wife is diabetic and she helps him to eat healthy. He typically eats 3 semi-healthy meals a day. He states that his health is the same, if not a little bit better than it was a year ago.        Objective:    Vitals: BP 121/74 (BP Location: Right Arm)   Pulse 62   Temp 97.2 F (36.2 C) (Oral)   Ht 5\' 8"  (1.727 m)   Wt 175 lb (79.4 kg)   BMI 26.61 kg/m   Body mass index is 26.61 kg/m.  Tobacco History  Smoking Status  . Never Smoker  Smokeless Tobacco  . Never Used     Counseling given: Not Answered he does not use any tobacco products.  Past Medical History:  Diagnosis Date  . BPH (benign prostatic hyperplasia)   . Cataract    never had any removed  . Hyperlipidemia   . Non Hodgkin's lymphoma ([place]) 11/27/2010  . Periumbilical hernia   . Thyroid disease    Past Surgical History:  Procedure Laterality Date  . APPENDECTOMY    . COLONOSCOPY W/ POLYPECTOMY    . EYE SURGERY     drooping eyelids  . INGUINAL HERNIA REPAIR  2006   left   Family History  Problem Relation Age of Onset  .  Heart disease Mother   . Heart failure Mother        age 72  . Heart disease Father   . Heart attack Father 81  . COPD Brother   . Suicidality Daughter   . Dementia Paternal Grandfather   . Colon cancer Neg Hx   . Stomach cancer Neg Hx    History  Sexual Activity  . Sexual activity: Yes    Outpatient Encounter Prescriptions as of 05/25/2016  Medication Sig  . aspirin 81 MG tablet Take 81 mg by mouth daily.    Marland Kitchen atorvastatin (LIPITOR) 80 MG tablet TAKE 1 TABLET BY MOUTH EVERY DAY  . b complex vitamins tablet Take 1 tablet by mouth daily.    . Cholecalciferol (VITAMIN D-1000 MAX ST) 1000 units tablet Take 2,000 Units by mouth daily.   . fish oil-omega-3 fatty acids 1000 MG capsule Take 1 g by mouth daily.    Marland Kitchen levothyroxine (SYNTHROID, LEVOTHROID) 75 MCG tablet TAKE 1 TABLET BY MOUTH DAILY BEFORE BREAKFAST.  Marland Kitchen niacin (NIASPAN) 500 MG CR tablet TAKE 1 TABLET (500 MG TOTAL) BY MOUTH AT BEDTIME.  . Probiotic Product (PROBIOTIC-10 PO) Take 1 capsule by mouth daily.  Marland Kitchen  sildenafil (REVATIO) 20 MG tablet Take 2- 5 tabs as needed for sexual activity. (Patient not taking: Reported on 05/25/2016)  . [DISCONTINUED] hydrocortisone (ANUSOL-HC) 2.5 % rectal cream Place 1 application rectally 2 (two) times daily.  . [DISCONTINUED] niacin (NIASPAN) 500 MG CR tablet TAKE 1 TABLET (500 MG TOTAL) BY MOUTH AT BEDTIME.   No facility-administered encounter medications on file as of 05/25/2016.     Activities of Daily Living In your present state of health, do you have any difficulty performing the following activities: 05/25/2016  Hearing? N  Vision? Y  Difficulty concentrating or making decisions? N  Walking or climbing stairs? N  Dressing or bathing? N  Doing errands, shopping? N  Some recent data might be hidden  He wears Rx glasses daily   Patient Care Team: [name], MD as PCP - General (Family Medicine) Pyrtle, [name], MD as Consulting Physician (Gastroenterology) [name], MD  as Consulting Physician (Oncology)   Assessment:    Exercise Activities and Dietary recommendations    Goals    . Increase physical activity          Would like to start running again- 5K    . Weight (lb) < 170 lb (77.1 kg)      Fall Risk Fall Risk  05/25/2016 01/04/2016 07/05/2015 01/03/2015 10/21/2014  Falls in the past year? No No No No No   Depression Screen PHQ 2/9 Scores 05/25/2016 01/04/2016 07/05/2015 01/03/2015  PHQ - 2 Score 0 0 0 0    Cognitive Function MMSE - Mini Mental State Exam 05/25/2016 10/21/2014  Orientation to time 5 4  Orientation to Place 5 5  Registration 3 3  Attention/ Calculation 5 5  Recall 3 3  Language- name 2 objects 2 2  Language- repeat 1 1  Language- follow 3 step command 3 3  Language- read & follow direction 1 1  Write a sentence 1 1  Copy design 1 1  Total score 30 29    score today is 30/30.    Immunization History  Administered Date(s) Administered  . Influenza,inj,Quad PF,36+ Mos 10/21/2013, 10/21/2014, 11/11/2015  . Pneumococcal Conjugate-13 12/17/2012   Screening Tests Health Maintenance  Topic Date Due  . PNA vac Low Risk Adult (2 of 2 - PPSV23) 06/03/2016 (Originally 12/17/2013)  . INFLUENZA VACCINE  08/15/2016  . TETANUS/TDAP  10/15/2021      Plan:   We will pull the pt's paper chart and check on the status of shingles and Pneumo 23. He is sure he has had it, but no dates are in epic. He will follow up with Dr Laurance Flatten in June and continue to see his specialist as directed. He will bring in a copy of his advanced directives at the next OV.  I have personally reviewed and noted the following in the patient's chart:   . Medical and social history . Use of alcohol, tobacco or illicit drugs  . Current medications and supplements . Functional ability and status . Nutritional status . Physical activity . Advanced directives . List of other physicians . Hospitalizations, surgeries, and ER visits in previous 12  months . Vitals . Screenings to include cognitive, depression, and falls . Referrals and appointments  In addition, I have reviewed and discussed with patient certain preventive protocols, quality metrics, and best practice recommendations. A written personalized care plan for preventive services as well as general preventive health recommendations were provided to patient.     Bullins, Cameron Proud, LPN  05/25/2016  I have reviewed and agree with the above AWV documentation.   Evelina Dun, FNP

## 2016-06-26 ENCOUNTER — Other Ambulatory Visit: Payer: Self-pay | Admitting: Family Medicine

## 2016-07-04 ENCOUNTER — Ambulatory Visit (INDEPENDENT_AMBULATORY_CARE_PROVIDER_SITE_OTHER): Payer: Medicare Other | Admitting: Family Medicine

## 2016-07-04 ENCOUNTER — Encounter: Payer: Self-pay | Admitting: Family Medicine

## 2016-07-04 VITALS — BP 128/81 | HR 63 | Temp 96.7°F | Ht 68.0 in | Wt 172.0 lb

## 2016-07-04 DIAGNOSIS — E559 Vitamin D deficiency, unspecified: Secondary | ICD-10-CM | POA: Diagnosis not present

## 2016-07-04 DIAGNOSIS — R200 Anesthesia of skin: Secondary | ICD-10-CM

## 2016-07-04 DIAGNOSIS — R202 Paresthesia of skin: Secondary | ICD-10-CM

## 2016-07-04 DIAGNOSIS — E7800 Pure hypercholesterolemia, unspecified: Secondary | ICD-10-CM

## 2016-07-04 DIAGNOSIS — N5201 Erectile dysfunction due to arterial insufficiency: Secondary | ICD-10-CM

## 2016-07-04 DIAGNOSIS — C8590 Non-Hodgkin lymphoma, unspecified, unspecified site: Secondary | ICD-10-CM | POA: Diagnosis not present

## 2016-07-04 DIAGNOSIS — N400 Enlarged prostate without lower urinary tract symptoms: Secondary | ICD-10-CM | POA: Diagnosis not present

## 2016-07-04 LAB — URINALYSIS, COMPLETE
Bilirubin, UA: NEGATIVE
GLUCOSE, UA: NEGATIVE
KETONES UA: NEGATIVE
LEUKOCYTES UA: NEGATIVE
NITRITE UA: NEGATIVE
Protein, UA: NEGATIVE
SPEC GRAV UA: 1.02 (ref 1.005–1.030)
Urobilinogen, Ur: 0.2 mg/dL (ref 0.2–1.0)
pH, UA: 6 (ref 5.0–7.5)

## 2016-07-04 LAB — MICROSCOPIC EXAMINATION
BACTERIA UA: NONE SEEN
Renal Epithel, UA: NONE SEEN /hpf
WBC, UA: NONE SEEN /hpf (ref 0–?)

## 2016-07-04 NOTE — Progress Notes (Signed)
Subjective:    Patient ID: Brad May, male    DOB: 1940-05-31, 76 y.o.   MRN: [id number]  HPI Pt here for follow up and management of chronic medical problems which includes hyperlipidemia. He is taking medication regularly.The patient is doing well overall. He has no specific complaints. He has non-Hodgkin's lymphoma and is followed by DrSherrill. The patient is not aware of anyone in his family with colon cancer. Both of his parents had heart disease. We will remind the patient that he is due for colonoscopy this September. He does have hyperlipidemia and hypothyroidism along with vitamin D deficiency. The patient sees the oncologist about every 8 months. He has met the gastroenterologist and as far as his next colonoscopy none is view unless he has problems. He is active physically and denies chest pain or shortness of breath. He has no trouble with his stomach or intestinal tract including nausea vomiting diarrhea blood in the stool or black tarry bowel movements. There is a history of a gastric polyp in the past. The patient is passing his water without problems. His weight is asked to down 3 pounds since the last visit and he does check his blood pressures on the outside and they're generally good. He has an eye exam coming up soon and we'll make sure that the ophthalmologist since [place] a copy of that report.    Patient Active Problem List   Diagnosis Date Noted  . 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 BPH (benign prostatic hyperplasia) 12/17/2012  . Hyperlipidemia 12/17/2012  . Hypothyroidism 12/17/2012  . Vitamin D deficiency 12/17/2012  . 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 Nonspecific (abnormal) findings on radiological and other examination of gastrointestinal tract 09/25/2010  . Colitis 09/25/2010  . Gastritis and duodenitis 09/25/2010  . Polyp of stomach 09/25/2010   Outpatient Encounter Prescriptions as of  07/04/2016  Medication Sig  . aspirin 81 MG tablet Take 81 mg by mouth daily.    Marland Kitchen atorvastatin (LIPITOR) 80 MG tablet TAKE 1 TABLET BY MOUTH EVERY DAY  . b complex vitamins tablet Take 1 tablet by mouth daily.    . Cholecalciferol (VITAMIN D-1000 MAX ST) 1000 units tablet Take 2,000 Units by mouth daily.   . fish oil-omega-3 fatty acids 1000 MG capsule Take 1 g by mouth daily.    Marland Kitchen levothyroxine (SYNTHROID, LEVOTHROID) 75 MCG tablet TAKE 1 TABLET BY MOUTH DAILY BEFORE BREAKFAST.  Marland Kitchen niacin (NIASPAN) 500 MG CR tablet TAKE 1 TABLET (500 MG TOTAL) BY MOUTH AT BEDTIME.  . Probiotic Product (PROBIOTIC-10 PO) Take 1 capsule by mouth daily.  . sildenafil (REVATIO) 20 MG tablet TAKE 2- 5 TABLETS AS NEEDED FOR SEXUAL ACTIVITY.   No facility-administered encounter medications on file as of 07/04/2016.       Review of Systems  Constitutional: Negative.   HENT: Negative.   Eyes: Negative.   Respiratory: Negative.   Cardiovascular: Negative.   Gastrointestinal: Negative.   Endocrine: Negative.   Genitourinary: Negative.   Musculoskeletal: Negative.   Skin: Negative.   Allergic/Immunologic: Negative.   Neurological: Negative.   Hematological: Negative.   Psychiatric/Behavioral: Negative.        Objective:   Physical Exam  Constitutional: He is oriented to person, place, and time. He appears well-developed and well-nourished. No distress.  The patient is pleasant and relaxed and active  HENT:  Head: Normocephalic and atraumatic.  Right Ear: External ear normal.  Left Ear: External ear  normal.  Nose: Nose normal.  Mouth/Throat: Oropharynx is clear and moist. No oropharyngeal exudate.  Eyes: Conjunctivae and EOM are normal. Pupils are equal, round, and reactive to light. Right eye exhibits no discharge. Left eye exhibits no discharge. No scleral icterus.  Eye exam soon  Neck: Normal range of motion. Neck supple. No thyromegaly present.  No bruits thyromegaly or anterior cervical  adenopathy  Cardiovascular: Normal rate, regular rhythm and intact distal pulses.   No murmur heard. The heart is regular at 60/m  Pulmonary/Chest: Effort normal and breath sounds normal. No respiratory distress. He has no wheezes. He has no rales. He exhibits no tenderness.  No axillary adenopathy Clear anteriorly and posteriorly  Abdominal: Soft. Bowel sounds are normal. He exhibits no mass. There is no tenderness. There is no rebound and no guarding.  No inguinal adenopathy and no organ enlargement bruits or masses palpable  Musculoskeletal: Normal range of motion. He exhibits no edema.  Lymphadenopathy:    He has no cervical adenopathy.  Neurological: He is alert and oriented to person, place, and time. He has normal reflexes. No cranial nerve deficit.  Skin: Skin is warm and dry. No rash noted.  Psychiatric: He has a normal mood and affect. His behavior is normal. Judgment and thought content normal.  Nursing note and vitals reviewed.  BP (!) 148/94 (BP Location: Right Arm)   Pulse 63   Temp (!) 96.7 F (35.9 C) (Oral)   Ht _0  (1.727 m)   Wt 172 lb (78 kg)   BMI 26.15 kg/m         Assessment & Plan:  1. Pure hypercholesterolemia -Continue with aggressive therapeutic lifestyle changes and current treatment pending results of lab work - BMP8+EGFR - CBC with Differential/Platelet - Hepatic function panel - NMR, lipoprofile  2. Vitamin D deficiency -Continue with current treatment pending results of lab work - CBC with Differential/Platelet - VITAMIN D 25 Hydroxy (Vit-D Deficiency, Fractures)  3. Benign prostatic hyperplasia, unspecified whether lower urinary tract symptoms present -No complaints today with voiding - CBC with Differential/Platelet  4. Numbness and tingling in both hands -May have carpal tunnel syndrome. If symptoms persist consider PNCV testing -Try wearing wrist braces at nighttime  5. 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 Erectile dysfunction due to arterial insufficiency -Continue with current treatment  Patient Instructions                       Medicare Annual Wellness Visit  [facility] and the medical providers at [facility] strive to bring you the best medical care.  In doing so we not only want to address your current medical conditions and concerns but also to detect new conditions early and prevent illness, disease and health-related problems.    Medicare offers a yearly Wellness Visit which allows our clinical staff to assess your need for preventative services including immunizations, lifestyle education, counseling to decrease risk of preventable diseases and screening for fall risk and other medical concerns.    This visit is provided free of charge (no copay) for all Medicare recipients. The clinical pharmacists at [place] have begun to conduct these Wellness Visits which will also include a thorough review of all your medications.    As you primary medical provider recommend that you make an appointment for your Annual Wellness Visit if you have not  done so already this year.  You may set up this appointment before you leave today or you may call back ([phone]) and schedule an appointment.  Please make sure when you call that you mention that you are scheduling your Annual Wellness Visit with the clinical pharmacist so that the appointment may be made for the proper length of time.      Continue current medications. Continue good therapeutic lifestyle changes which include good diet and exercise. Fall precautions discussed with patient. If an FOBT was given today- please return it to our front desk. If you are over 21 years old - you may need Prevnar 84 or the adult Pneumonia vaccine.  **Flu shots are available--- please call and schedule a FLU-CLINIC appointment**  After  your visit with [place] today you will receive a survey in the mail or online from [employer] regarding your care with [place]. Please take a moment to fill this out. Your feedback is very important to [place] as you can help [place] better understand your patient needs as well as improve your experience and satisfaction. WE CARE ABOUT YOU!!!   Stay active physically in the summer drink plenty of fluids and stay well hydrated Watch sodium intake Pay more attention to which fingers have numbness and tingling Consider wearing wrist braces at nighttime For your wife, free style Libra Follow-up with oncology and gastroenterology as planned Make sure that we get a copy of your eye exam report   Arrie Senate MD

## 2016-07-04 NOTE — Addendum Note (Signed)
Addended by: Zannie Cove on: 07/04/2016 09:26 AM   Modules accepted: Orders

## 2016-07-04 NOTE — Patient Instructions (Addendum)
Medicare Annual Wellness Visit  [facility] and the medical providers at [facility] strive to bring you the best medical care.  In doing so we not only want to address your current medical conditions and concerns but also to detect new conditions early and prevent illness, disease and health-related problems.    Medicare offers a yearly Wellness Visit which allows our clinical staff to assess your need for preventative services including immunizations, lifestyle education, counseling to decrease risk of preventable diseases and screening for fall risk and other medical concerns.    This visit is provided free of charge (no copay) for all Medicare recipients. The clinical pharmacists at [facility] have begun to conduct these Wellness Visits which will also include a thorough review of all your medications.    As you primary medical provider recommend that you make an appointment for your Annual Wellness Visit if you have not done so already this year.  You may set up this appointment before you leave today or you may call back ([phone]) and schedule an appointment.  Please make sure when you call that you mention that you are scheduling your Annual Wellness Visit with the clinical pharmacist so that the appointment may be made for the proper length of time.      Continue current medications. Continue good therapeutic lifestyle changes which include good diet and exercise. Fall precautions discussed with patient. If an FOBT was given today- please return it to our front desk. If you are over 4 years old - you may need Prevnar 41 or the adult Pneumonia vaccine.  **Flu shots are available--- please call and schedule a FLU-CLINIC appointment**  After your visit with [place] today you will receive a survey in the mail or online from [employer] regarding your care with [place]. Please take a moment to fill this out. Your feedback is very  important to [place] as you can help [place] better understand your patient needs as well as improve your experience and satisfaction. WE CARE ABOUT YOU!!!   Stay active physically in the summer drink plenty of fluids and stay well hydrated Watch sodium intake Pay more attention to which fingers have numbness and tingling Consider wearing wrist braces at nighttime For your wife, free style Libra Follow-up with oncology and gastroenterology as planned Make sure that we get a copy of your eye exam report

## 2016-07-05 LAB — HEPATIC FUNCTION PANEL
ALK PHOS: 109 IU/L (ref 39–117)
ALT: 35 IU/L (ref 0–44)
AST: 27 IU/L (ref 0–40)
Albumin: 4.2 g/dL (ref 3.5–4.8)
Bilirubin Total: 0.7 mg/dL (ref 0.0–1.2)
Bilirubin, Direct: 0.17 mg/dL (ref 0.00–0.40)
TOTAL PROTEIN: 6.5 g/dL (ref 6.0–8.5)

## 2016-07-05 LAB — CBC WITH DIFFERENTIAL/PLATELET
BASOS ABS: 0 10*3/uL (ref 0.0–0.2)
BASOS: 1 %
EOS (ABSOLUTE): 0.2 10*3/uL (ref 0.0–0.4)
Eos: 5 %
HEMOGLOBIN: 15.7 g/dL (ref 13.0–17.7)
Hematocrit: 43.4 % (ref 37.5–51.0)
Immature Grans (Abs): 0 10*3/uL (ref 0.0–0.1)
Immature Granulocytes: 0 %
LYMPHS ABS: 2 10*3/uL (ref 0.7–3.1)
Lymphs: 38 %
MCH: 33.5 pg — AB (ref 26.6–33.0)
MCHC: 36.2 g/dL — AB (ref 31.5–35.7)
MCV: 93 fL (ref 79–97)
Monocytes Absolute: 0.6 10*3/uL (ref 0.1–0.9)
Monocytes: 11 %
NEUTROS ABS: 2.5 10*3/uL (ref 1.4–7.0)
Neutrophils: 45 %
Platelets: 197 10*3/uL (ref 150–379)
RBC: 4.68 x10E6/uL (ref 4.14–5.80)
RDW: 13.4 % (ref 12.3–15.4)
WBC: 5.3 10*3/uL (ref 3.4–10.8)

## 2016-07-05 LAB — NMR, LIPOPROFILE
Cholesterol: 129 mg/dL (ref 100–199)
HDL Cholesterol by NMR: 40 mg/dL (ref >39)
HDL Particle Number: 28.2 umol/L — ABNORMAL LOW (ref >=30.5)
LDL Particle Number: 1097 nmol/L — ABNORMAL HIGH (ref <1000)
LDL SIZE: 20.7 nm (ref >20.5)
LDL-C: 71 mg/dL (ref 0–99)
LP-IR SCORE: 57 — AB (ref <=45)
SMALL LDL PARTICLE NUMBER: 486 nmol/L (ref <=527)
TRIGLYCERIDES BY NMR: 89 mg/dL (ref 0–149)

## 2016-07-05 LAB — BMP8+EGFR
BUN/Creatinine Ratio: 10 (ref 10–24)
BUN: 10 mg/dL (ref 8–27)
CHLORIDE: 102 mmol/L (ref 96–106)
CO2: 23 mmol/L (ref 20–29)
Calcium: 9.6 mg/dL (ref 8.6–10.2)
Creatinine, Ser: 0.96 mg/dL (ref 0.76–1.27)
GFR calc non Af Amer: 76 mL/min/{1.73_m2} (ref >59)
GFR, EST AFRICAN AMERICAN: 88 mL/min/{1.73_m2} (ref >59)
Glucose: 101 mg/dL — ABNORMAL HIGH (ref 65–99)
Potassium: 4.5 mmol/L (ref 3.5–5.2)
Sodium: 141 mmol/L (ref 134–144)

## 2016-07-05 LAB — VITAMIN B12: Vitamin B-12: >2000 pg/mL — ABNORMAL HIGH (ref 232–1245)

## 2016-07-05 LAB — VITAMIN D 25 HYDROXY (VIT D DEFICIENCY, FRACTURES): VIT D 25 HYDROXY: 38.8 ng/mL (ref 30.0–100.0)

## 2016-08-07 ENCOUNTER — Other Ambulatory Visit: Payer: Self-pay | Admitting: Family Medicine

## 2016-08-28 DIAGNOSIS — H5213 Myopia, bilateral: Secondary | ICD-10-CM | POA: Diagnosis not present

## 2016-08-28 DIAGNOSIS — H52223 Regular astigmatism, bilateral: Secondary | ICD-10-CM | POA: Diagnosis not present

## 2016-08-28 DIAGNOSIS — H00024 Hordeolum internum left upper eyelid: Secondary | ICD-10-CM | POA: Diagnosis not present

## 2016-08-28 DIAGNOSIS — H524 Presbyopia: Secondary | ICD-10-CM | POA: Diagnosis not present

## 2016-08-28 DIAGNOSIS — H43812 Vitreous degeneration, left eye: Secondary | ICD-10-CM | POA: Diagnosis not present

## 2016-08-28 DIAGNOSIS — H25813 Combined forms of age-related cataract, bilateral: Secondary | ICD-10-CM | POA: Diagnosis not present

## 2016-09-05 ENCOUNTER — Telehealth: Payer: Self-pay | Admitting: Oncology

## 2016-09-05 ENCOUNTER — Other Ambulatory Visit: Payer: Self-pay | Admitting: Family Medicine

## 2016-09-05 NOTE — Telephone Encounter (Signed)
Call day moved from 10/25-10/26 patient was informed

## 2016-10-11 ENCOUNTER — Other Ambulatory Visit: Payer: Self-pay | Admitting: Family Medicine

## 2016-10-12 NOTE — Telephone Encounter (Signed)
Authorize 30 days only. Then contact the patient letting them know that they will need an appointment before any further prescriptions can be sent in. 

## 2016-10-12 NOTE — Telephone Encounter (Signed)
Last thyroid 07/05/15  DWM

## 2016-11-08 ENCOUNTER — Ambulatory Visit: Payer: BC Managed Care – PPO | Admitting: Oncology

## 2016-11-08 ENCOUNTER — Other Ambulatory Visit: Payer: BC Managed Care – PPO

## 2016-11-09 ENCOUNTER — Ambulatory Visit (HOSPITAL_BASED_OUTPATIENT_CLINIC_OR_DEPARTMENT_OTHER): Payer: Medicare Other | Admitting: Oncology

## 2016-11-09 ENCOUNTER — Telehealth: Payer: Self-pay | Admitting: Oncology

## 2016-11-09 ENCOUNTER — Other Ambulatory Visit (HOSPITAL_BASED_OUTPATIENT_CLINIC_OR_DEPARTMENT_OTHER): Payer: Medicare Other

## 2016-11-09 VITALS — BP 131/89 | HR 63 | Temp 97.7°F | Resp 18 | Ht 68.0 in | Wt 172.2 lb

## 2016-11-09 DIAGNOSIS — Z8572 Personal history of non-Hodgkin lymphomas: Secondary | ICD-10-CM

## 2016-11-09 DIAGNOSIS — Z23 Encounter for immunization: Secondary | ICD-10-CM

## 2016-11-09 DIAGNOSIS — C8380 Other non-follicular lymphoma, unspecified site: Secondary | ICD-10-CM

## 2016-11-09 DIAGNOSIS — C8590 Non-Hodgkin lymphoma, unspecified, unspecified site: Secondary | ICD-10-CM

## 2016-11-09 LAB — CBC WITH DIFFERENTIAL/PLATELET
BASO%: 1.1 % (ref 0.0–2.0)
BASOS ABS: 0.1 10*3/uL (ref 0.0–0.1)
EOS ABS: 0.2 10*3/uL (ref 0.0–0.5)
EOS%: 5.4 % (ref 0.0–7.0)
HEMATOCRIT: 44.5 % (ref 38.4–49.9)
HEMOGLOBIN: 15.3 g/dL (ref 13.0–17.1)
LYMPH#: 1.5 10*3/uL (ref 0.9–3.3)
LYMPH%: 32.9 % (ref 14.0–49.0)
MCH: 32.5 pg (ref 27.2–33.4)
MCHC: 34.4 g/dL (ref 32.0–36.0)
MCV: 94.5 fL (ref 79.3–98.0)
MONO#: 0.6 10*3/uL (ref 0.1–0.9)
MONO%: 12.3 % (ref 0.0–14.0)
NEUT#: 2.2 10*3/uL (ref 1.5–6.5)
NEUT%: 48.3 % (ref 39.0–75.0)
PLATELETS: 165 10*3/uL (ref 140–400)
RBC: 4.71 10*6/uL (ref 4.20–5.82)
RDW: 12.8 % (ref 11.0–14.6)
WBC: 4.5 10*3/uL (ref 4.0–10.3)

## 2016-11-09 MED ORDER — INFLUENZA VAC SPLIT HIGH-DOSE 0.5 ML IM SUSY
0.5000 mL | PREFILLED_SYRINGE | Freq: Once | INTRAMUSCULAR | Status: AC
  Administered 2016-11-09: 0.5 mL via INTRAMUSCULAR
  Filled 2016-11-09: qty 0.5

## 2016-11-09 NOTE — Progress Notes (Signed)
  [facility] OFFICE PROGRESS NOTE   Diagnosis: Marginal zone lymphoma  INTERVAL HISTORY:   Brad May returns as scheduled. He feels well. Good appetite and energy level. No palpable lymph nodes. No fever. No rectal bleeding. He has occasional numbness in the right arm and hand that resolves with movement.  Objective:  Vital signs in last 24 hours:  Blood pressure 131/89, pulse 63, temperature 97.7 F (36.5 C), temperature source Oral, resp. rate 18, height 5\' 8"  (1.727 m), weight 172 lb 3.2 oz (78.1 kg), SpO2 100 %.    HEENT: Neck without mass Lymphatics: No cervical, supraclavicular, axillary, or inguinal nodes Resp: Lungs clear bilaterally Cardio: Regular rate and rhythm GI: No hepatosplenomegaly, no mass, nontender Vascular: No leg edema      Lab Results:  Lab Results  Component Value Date   WBC 4.5 11/09/2016   HGB 15.3 11/09/2016   HCT 44.5 11/09/2016   MCV 94.5 11/09/2016   PLT 165 11/09/2016   NEUTROABS 2.2 11/09/2016     Medications: I have reviewed the patient's current medications.  1. History of intermittent rectal bleeding.  2. 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  1. 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  2. CT of the abdomen and pelvis 08/04/2010 with abdominal/pelvic lymphadenopathy. 3. PET scan 09/13/2010 confirming mildly enlarged abdominal/pelvic lymph nodes with borderline increased FDG activity. 4. Initiation of single agent rituximab therapy on 11/28/2010, he completed 4 weekly treatments on 12/21/2010 5. 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 6. Cycle 1 bendamustine/rituximab 05/01/2011 7. Cycle 2 bendamustine/rituximab 05/29/2011 8. Cycle 3 bendamustine/rituximab 06/26/2011 9. Restaging PET scan 07/27/2011-no evidence of lymphoma 10. Restaging sigmoidoscopy 10/03/2011-normal colonic mucosa 3. History of iron deficiency. Ferritin was in the low normal range on 11/14/2010. Hemoglobin remains normal. 4. ? left upper lung nodule on a chest x-ray 30/86/5784-ON hypermetabolic lung nodule was seen on a PET scan 09/13/2010   Disposition:  [name] remains in clinical remission from [place]. He will return for an office visit in one year. He will contact us in the interim for new symptoms. He received an influenza vaccine today.  15 minutes were spent with the patient today. The majority of the time was used for counseling and coordination of care.  Donneta Romberg, MD  11/09/2016  10:33 AM

## 2016-11-09 NOTE — Telephone Encounter (Signed)
Gave avs and calendar for October 2019 °

## 2016-12-01 ENCOUNTER — Other Ambulatory Visit: Payer: Self-pay | Admitting: Family Medicine

## 2016-12-03 NOTE — Telephone Encounter (Signed)
Last thyroid 06/2015  DWM

## 2017-01-03 ENCOUNTER — Other Ambulatory Visit: Payer: Self-pay | Admitting: Family Medicine

## 2017-01-04 ENCOUNTER — Other Ambulatory Visit: Payer: Self-pay | Admitting: Family Medicine

## 2017-01-10 ENCOUNTER — Ambulatory Visit: Payer: Medicare Other | Admitting: Family Medicine

## 2017-01-11 ENCOUNTER — Ambulatory Visit (INDEPENDENT_AMBULATORY_CARE_PROVIDER_SITE_OTHER): Payer: Medicare Other

## 2017-01-11 ENCOUNTER — Ambulatory Visit (INDEPENDENT_AMBULATORY_CARE_PROVIDER_SITE_OTHER): Payer: Medicare Other | Admitting: Family Medicine

## 2017-01-11 ENCOUNTER — Encounter: Payer: Self-pay | Admitting: Family Medicine

## 2017-01-11 VITALS — BP 110/68 | HR 75 | Temp 96.7°F | Ht 68.0 in | Wt 176.0 lb

## 2017-01-11 DIAGNOSIS — E7800 Pure hypercholesterolemia, unspecified: Secondary | ICD-10-CM | POA: Diagnosis not present

## 2017-01-11 DIAGNOSIS — E559 Vitamin D deficiency, unspecified: Secondary | ICD-10-CM | POA: Diagnosis not present

## 2017-01-11 DIAGNOSIS — E034 Atrophy of thyroid (acquired): Secondary | ICD-10-CM

## 2017-01-11 DIAGNOSIS — N5201 Erectile dysfunction due to arterial insufficiency: Secondary | ICD-10-CM | POA: Diagnosis not present

## 2017-01-11 DIAGNOSIS — C8590 Non-Hodgkin lymphoma, unspecified, unspecified site: Secondary | ICD-10-CM | POA: Diagnosis not present

## 2017-01-11 DIAGNOSIS — N400 Enlarged prostate without lower urinary tract symptoms: Secondary | ICD-10-CM | POA: Diagnosis not present

## 2017-01-11 LAB — MICROSCOPIC EXAMINATION
BACTERIA UA: NONE SEEN
Epithelial Cells (non renal): NONE SEEN /hpf (ref 0–10)
RENAL EPITHEL UA: NONE SEEN /HPF
WBC UA: NONE SEEN /HPF (ref 0–?)

## 2017-01-11 LAB — URINALYSIS, COMPLETE
Bilirubin, UA: NEGATIVE
Glucose, UA: NEGATIVE
Ketones, UA: NEGATIVE
Leukocytes, UA: NEGATIVE
NITRITE UA: NEGATIVE
PH UA: 5.5 (ref 5.0–7.5)
PROTEIN UA: NEGATIVE
Specific Gravity, UA: 1.025 (ref 1.005–1.030)
UUROB: 0.2 mg/dL (ref 0.2–1.0)

## 2017-01-11 NOTE — Patient Instructions (Addendum)
Medicare Annual Wellness Visit  [facility] and the medical providers at [facility] strive to bring you the best medical care.  In doing so we not only want to address your current medical conditions and concerns but also to detect new conditions early and prevent illness, disease and health-related problems.    Medicare offers a yearly Wellness Visit which allows our clinical staff to assess your need for preventative services including immunizations, lifestyle education, counseling to decrease risk of preventable diseases and screening for fall risk and other medical concerns.    This visit is provided free of charge (no copay) for all Medicare recipients. The clinical pharmacists at [facility] have begun to conduct these Wellness Visits which will also include a thorough review of all your medications.    As you primary medical provider recommend that you make an appointment for your Annual Wellness Visit if you have not done so already this year.  You may set up this appointment before you leave today or you may call back ([phone]) and schedule an appointment.  Please make sure when you call that you mention that you are scheduling your Annual Wellness Visit with the clinical pharmacist so that the appointment may be made for the proper length of time.     Continue current medications. Continue good therapeutic lifestyle changes which include good diet and exercise. Fall precautions discussed with patient. If an FOBT was given today- please return it to our front desk. If you are over 36 years old - you may need Prevnar 60 or the adult Pneumonia vaccine.  **Flu shots are available--- please call and schedule a FLU-CLINIC appointment**  After your visit with [place] today you will receive a survey in the mail or online from [employer] regarding your care with [place]. Please take a moment to fill this out. Your feedback is very  important to [place] as you can help [place] better understand your patient needs as well as improve your experience and satisfaction. WE CARE ABOUT YOU!!!   Continue to stay active physically and eat healthy Drink plenty of fluids during the winter months and use good hand hygiene Follow-up regularly with oncologist If you do not hear from [place] regarding a phone call to the gastroenterologist, give [place] a call in a couple weeks and confirm our conversation with him regarding the need for a colonoscopy or not.

## 2017-01-11 NOTE — Progress Notes (Signed)
Subjective:    Patient ID: Brad May, male    DOB: 11/30/1940, 76 y.o.   MRN: [id number]  HPI Pt here for follow up and management of chronic medical problems which includes hyperlipidemia. He is taking medication regularly.  The patient is doing well overall and has no specific complaints.  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  He sees the oncologist regularly.  He also has vitamin D deficiency hyperlipidemia and hypothyroidism.  He has ED.  And BPH.  Patient's last visit with Dr. Benay Spice was in October of this year.  He will continue to see him on a yearly visit.  He did receive his flu vaccine at this visit in October.  The patient has no complaints.  He denies chest pain pressure tightness or shortness of breath anymore than usual.  He is active physically and runs and jogs regularly.  He denies any trouble with his stomach including nausea vomiting diarrhea blood in the stool black tarry bowel movements or change in bowel habits.  He is passing his water well and has nocturia x1.  He has had a stress test in our office that was normal.  Concerning the need for a colonoscopy, he did see the gastroenterologist and he says that he had a sigmoidoscopy and there was no mention of doing a repeat colonoscopy.     Patient Active Problem List   Diagnosis Date Noted  . 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 BPH (benign prostatic hyperplasia) 12/17/2012  . Hyperlipidemia 12/17/2012  . Hypothyroidism 12/17/2012  . Vitamin D deficiency 12/17/2012  . 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 Nonspecific (abnormal) findings on radiological and other examination of gastrointestinal tract 09/25/2010  . Colitis 09/25/2010  . Gastritis and duodenitis 09/25/2010  . Polyp of stomach 09/25/2010   Outpatient Encounter Medications as of 01/11/2017  Medication Sig  .  aspirin 81 MG tablet Take 81 mg by mouth daily.    Marland Kitchen atorvastatin (LIPITOR) 80 MG tablet TAKE 1 TABLET BY MOUTH EVERY DAY  . b complex vitamins tablet Take 1 tablet by mouth daily.    . Cholecalciferol (VITAMIN D-1000 MAX ST) 1000 units tablet Take 2,000 Units by mouth daily.   . Coenzyme Q10 (CO Q 10) 100 MG CAPS Take by mouth.  . fish oil-omega-3 fatty acids 1000 MG capsule Take 1 g by mouth daily.    Marland Kitchen levothyroxine (SYNTHROID, LEVOTHROID) 75 MCG tablet TAKE 1 TABLET BY MOUTH EVERY DAY BEFORE BREAKFAST  . niacin (NIASPAN) 500 MG CR tablet TAKE 1 TABLET (500 MG TOTAL) BY MOUTH AT BEDTIME.  . Probiotic Product (PROBIOTIC-10 PO) Take 1 capsule by mouth daily.  . sildenafil (REVATIO) 20 MG tablet TAKE 2- 5 TABLETS AS NEEDED FOR SEXUAL ACTIVITY.   No facility-administered encounter medications on file as of 01/11/2017.      Review of Systems  Constitutional: Negative.   HENT: Negative.   Eyes: Negative.   Respiratory: Negative.   Cardiovascular: Negative.   Gastrointestinal: Negative.   Endocrine: Negative.   Genitourinary: Negative.   Musculoskeletal: Negative.   Skin: Negative.   Allergic/Immunologic: Negative.   Neurological: Negative.   Hematological: Negative.   Psychiatric/Behavioral: Negative.        Objective:   Physical Exam  Constitutional: He is oriented to person, place, and time. He appears well-developed and well-nourished. No distress.  HENT:  Head: Normocephalic and atraumatic.  Right Ear: External ear normal.  Left Ear: External ear normal.  Nose: Nose normal.  Mouth/Throat: Oropharynx is clear and moist. No oropharyngeal exudate.  Eyes: Conjunctivae and EOM are normal. Pupils are equal, round, and reactive to light. Right eye exhibits no discharge. Left eye exhibits no discharge. No scleral icterus.  Patient is up-to-date on his eye exams.  Neck: Normal range of motion. Neck supple. No thyromegaly present.  No bruits thyromegaly or anterior cervical  adenopathy  Cardiovascular: Normal rate, regular rhythm, normal heart sounds and intact distal pulses.  No murmur heard. Heart has a regular rate and rhythm at 72/min  Pulmonary/Chest: Effort normal and breath sounds normal. No respiratory distress. He has no wheezes. He has no rales. He exhibits no tenderness.  Clear anteriorly and posteriorly and no axillary adenopathy  Abdominal: Soft. Bowel sounds are normal. He exhibits no mass. There is no tenderness. There is no rebound and no guarding.  No abdominal masses bruits organ enlargement or inguinal adenopathy  Genitourinary: Rectum normal and penis normal.  Genitourinary Comments: The prostate was enlarged but soft and smooth.  There were no rectal masses.  The external genitalia were within normal limits with no inguinal hernias being palpated.  Musculoskeletal: Normal range of motion. He exhibits no edema.  Lymphadenopathy:    He has no cervical adenopathy.  Neurological: He is alert and oriented to person, place, and time. He has normal reflexes. No cranial nerve deficit.  Skin: Skin is warm and dry. No rash noted.  Psychiatric: He has a normal mood and affect. His behavior is normal. Judgment and thought content normal.  Nursing note and vitals reviewed.  BP 110/68 (BP Location: Left Arm)   Pulse 75   Temp (!) 96.7 F (35.9 C) (Oral)   Ht '5\' 8"'  (1.727 m)   Wt 176 lb (79.8 kg)   BMI 26.76 kg/m         Assessment & Plan:  1. Pure hypercholesterolemia -Continue current treatment and aggressive therapeutic lifestyle changes pending results of lab work - BMP8+EGFR - CBC with Differential/Platelet - NMR, lipoprofile - Hepatic function panel - Thyroid Panel With TSH - DG Chest 2 View; Future  2. Benign prostatic hyperplasia, unspecified whether lower urinary tract symptoms present -The patient is having no problems with passing his water other than nocturia. - CBC with Differential/Platelet - PSA, total and free -  Urinalysis, Complete  3. Vitamin D deficiency -Continue vitamin D replacement pending results of lab work - CBC with Differential/Platelet - VITAMIN D 25 Hydroxy (Vit-D Deficiency, Fractures)  4. 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 Benay Spice, oncology  5. Erectile dysfunction due to arterial insufficiency -Continue with generic sildenafil  6. Hypothyroidism due to acquired atrophy of thyroid -Continue with current treatment pending results of lab work  No orders of the defined types were placed in this encounter.  Patient Instructions                       Medicare Annual Wellness Visit  [facility] and the medical providers at [facility] strive to bring you the best medical care.  In doing so we not only want to address your current medical conditions and concerns but also to detect new conditions early and prevent illness, disease and health-related problems.    Medicare offers a yearly Wellness Visit which allows our clinical staff to assess your need for preventative services including immunizations, lifestyle education, counseling to decrease risk of  preventable diseases and screening for fall risk and other medical concerns.    This visit is provided free of charge (no copay) for all Medicare recipients. The clinical pharmacists at [facility] have begun to conduct these Wellness Visits which will also include a thorough review of all your medications.    As you primary medical provider recommend that you make an appointment for your Annual Wellness Visit if you have not done so already this year.  You may set up this appointment before you leave today or you may call back ([phone]) and schedule an appointment.  Please make sure when you call that you mention that you are scheduling your Annual Wellness Visit with the clinical pharmacist so that the appointment may be made for the proper length of time.     Continue  current medications. Continue good therapeutic lifestyle changes which include good diet and exercise. Fall precautions discussed with patient. If an FOBT was given today- please return it to our front desk. If you are over 77 years old - you may need Prevnar 52 or the adult Pneumonia vaccine.  **Flu shots are available--- please call and schedule a FLU-CLINIC appointment**  After your visit with [place] today you will receive a survey in the mail or online from [employer] regarding your care with [place]. Please take a moment to fill this out. Your feedback is very important to [place] as you can help [place] better understand your patient needs as well as improve your experience and satisfaction. WE CARE ABOUT YOU!!!   Continue to stay active physically and eat healthy Drink plenty of fluids during the winter months and use good hand hygiene Follow-up regularly with oncologist If you do not hear from [place] regarding a phone call to the gastroenterologist, give [place] a call in a couple weeks and confirm our conversation with him regarding the need for a colonoscopy or not.  Arrie Senate MD

## 2017-01-12 LAB — CBC WITH DIFFERENTIAL/PLATELET
BASOS ABS: 0.1 10*3/uL (ref 0.0–0.2)
Basos: 1 %
EOS (ABSOLUTE): 0.4 10*3/uL (ref 0.0–0.4)
Eos: 6 %
Hematocrit: 45.7 % (ref 37.5–51.0)
Hemoglobin: 16.2 g/dL (ref 13.0–17.7)
Immature Grans (Abs): 0 10*3/uL (ref 0.0–0.1)
Immature Granulocytes: 0 %
LYMPHS ABS: 1.6 10*3/uL (ref 0.7–3.1)
Lymphs: 29 %
MCH: 33.1 pg — AB (ref 26.6–33.0)
MCHC: 35.4 g/dL (ref 31.5–35.7)
MCV: 94 fL (ref 79–97)
MONOS ABS: 0.6 10*3/uL (ref 0.1–0.9)
Monocytes: 12 %
NEUTROS ABS: 2.9 10*3/uL (ref 1.4–7.0)
Neutrophils: 52 %
PLATELETS: 181 10*3/uL (ref 150–379)
RBC: 4.89 x10E6/uL (ref 4.14–5.80)
RDW: 13 % (ref 12.3–15.4)
WBC: 5.5 10*3/uL (ref 3.4–10.8)

## 2017-01-12 LAB — BMP8+EGFR
BUN / CREAT RATIO: 18 (ref 10–24)
BUN: 16 mg/dL (ref 8–27)
CHLORIDE: 104 mmol/L (ref 96–106)
CO2: 21 mmol/L (ref 20–29)
Calcium: 9.5 mg/dL (ref 8.6–10.2)
Creatinine, Ser: 0.9 mg/dL (ref 0.76–1.27)
GFR calc non Af Amer: 82 mL/min/{1.73_m2} (ref >59)
GFR, EST AFRICAN AMERICAN: 95 mL/min/{1.73_m2} (ref >59)
GLUCOSE: 107 mg/dL — AB (ref 65–99)
POTASSIUM: 4.4 mmol/L (ref 3.5–5.2)
Sodium: 140 mmol/L (ref 134–144)

## 2017-01-12 LAB — NMR, LIPOPROFILE
CHOLESTEROL: 162 mg/dL (ref 100–199)
HDL Cholesterol by NMR: 41 mg/dL (ref >39)
HDL Particle Number: 28.4 umol/L — ABNORMAL LOW (ref >=30.5)
LDL PARTICLE NUMBER: 1409 nmol/L — AB (ref <1000)
LDL SIZE: 20.8 nm (ref >20.5)
LDL-C: 96 mg/dL (ref 0–99)
LP-IR SCORE: 67 — AB (ref <=45)
Small LDL Particle Number: 662 nmol/L — ABNORMAL HIGH (ref <=527)
Triglycerides by NMR: 124 mg/dL (ref 0–149)

## 2017-01-12 LAB — THYROID PANEL WITH TSH
Free Thyroxine Index: 1.6 (ref 1.2–4.9)
T3 UPTAKE RATIO: 25 % (ref 24–39)
T4, Total: 6.4 ug/dL (ref 4.5–12.0)
TSH: 6.93 u[IU]/mL — AB (ref 0.450–4.500)

## 2017-01-12 LAB — HEPATIC FUNCTION PANEL
ALT: 33 IU/L (ref 0–44)
AST: 36 IU/L (ref 0–40)
Albumin: 4.6 g/dL (ref 3.5–4.8)
Alkaline Phosphatase: 118 IU/L — ABNORMAL HIGH (ref 39–117)
BILIRUBIN TOTAL: 0.9 mg/dL (ref 0.0–1.2)
Bilirubin, Direct: 0.17 mg/dL (ref 0.00–0.40)
Total Protein: 6.8 g/dL (ref 6.0–8.5)

## 2017-01-12 LAB — PSA, TOTAL AND FREE
PROSTATE SPECIFIC AG, SERUM: 2.9 ng/mL (ref 0.0–4.0)
PSA FREE PCT: 22.8 %
PSA FREE: 0.66 ng/mL

## 2017-01-12 LAB — VITAMIN D 25 HYDROXY (VIT D DEFICIENCY, FRACTURES): Vit D, 25-Hydroxy: 43.9 ng/mL (ref 30.0–100.0)

## 2017-01-16 ENCOUNTER — Telehealth: Payer: Self-pay | Admitting: Family Medicine

## 2017-01-16 NOTE — Telephone Encounter (Signed)
Patient aware he does need another xray per Dr. Tawanna Sat note. States he will come have this done this week.

## 2017-01-17 ENCOUNTER — Other Ambulatory Visit: Payer: Self-pay | Admitting: *Deleted

## 2017-01-17 ENCOUNTER — Telehealth: Payer: Self-pay | Admitting: *Deleted

## 2017-01-17 DIAGNOSIS — E039 Hypothyroidism, unspecified: Secondary | ICD-10-CM

## 2017-01-17 MED ORDER — LEVOTHYROXINE SODIUM 88 MCG PO TABS: 88.0000 ug | ORAL_TABLET | Freq: Every day | ORAL | 3 refills | Status: DC

## 2017-01-17 NOTE — Telephone Encounter (Signed)
-----   Message from Larina Bras, [place] sent at 01/14/2017 12:51 PM EST -----   ----- Message ----- From: Jerene Bears, MD Sent: 01/14/2017  11:10 AM To: Larina Bras, CMA, Zannie Cove, LPN  Thanks for your message I will see him back and discuss once again given it has been 2 yrs Thanks [facility]  Dottie Next available OV to discuss [facility] screening/surveillance [facility]  ----- Message ----- From: Zannie Cove, LPN Sent: 58/25/1898   8:28 AM To: Jerene Bears, MD  Hi Dr Hilarie Fredrickson! Dr Laurance Flatten wanted me to message you about this pt once more. You seen him in Sept of 2016 and at that time made the decision to hold off on the colonoscopy. He had lymphoma and follows Dr Learta Codding. The pt was and still is doing well, with no complaints. Dr Laurance Flatten just wants to double check with you again since we are 2 years out, and make sure you still feel the same about this.  As always, thanks for your help!  Georgina Pillion, LPN

## 2017-01-17 NOTE — Telephone Encounter (Signed)
I have spoken to patient to advise of Dr Pyrtle/Dr Tawanna Sat conversation. He has scheduled an appointment with Dr Hilarie Fredrickson for 03/18/17 at 3:00 pm. Patient verbalizes understanding.

## 2017-01-18 ENCOUNTER — Other Ambulatory Visit (INDEPENDENT_AMBULATORY_CARE_PROVIDER_SITE_OTHER): Payer: Medicare Other

## 2017-01-18 ENCOUNTER — Other Ambulatory Visit: Payer: Self-pay | Admitting: Family Medicine

## 2017-01-18 DIAGNOSIS — R911 Solitary pulmonary nodule: Secondary | ICD-10-CM

## 2017-01-18 DIAGNOSIS — R222 Localized swelling, mass and lump, trunk: Secondary | ICD-10-CM

## 2017-01-18 DIAGNOSIS — R918 Other nonspecific abnormal finding of lung field: Secondary | ICD-10-CM | POA: Diagnosis not present

## 2017-02-03 ENCOUNTER — Other Ambulatory Visit: Payer: Self-pay | Admitting: Family Medicine

## 2017-03-18 ENCOUNTER — Ambulatory Visit: Payer: Medicare Other | Admitting: Internal Medicine

## 2017-03-26 DIAGNOSIS — H00021 Hordeolum internum right upper eyelid: Secondary | ICD-10-CM | POA: Diagnosis not present

## 2017-04-02 ENCOUNTER — Encounter: Payer: Self-pay | Admitting: *Deleted

## 2017-04-22 ENCOUNTER — Ambulatory Visit (INDEPENDENT_AMBULATORY_CARE_PROVIDER_SITE_OTHER): Payer: Medicare Other | Admitting: Internal Medicine

## 2017-04-22 ENCOUNTER — Encounter: Payer: Self-pay | Admitting: Internal Medicine

## 2017-04-22 VITALS — BP 124/74 | HR 67 | Ht 68.0 in | Wt 171.4 lb

## 2017-04-22 DIAGNOSIS — Z1211 Encounter for screening for malignant neoplasm of colon: Secondary | ICD-10-CM | POA: Diagnosis not present

## 2017-04-22 DIAGNOSIS — Z86010 Personal history of colonic polyps: Secondary | ICD-10-CM

## 2017-04-22 MED ORDER — NA SULFATE-K SULFATE-MG SULF 17.5-3.13-1.6 GM/177ML PO SOLN: 1.0000 | Freq: Once | ORAL | 0 refills | Status: AC

## 2017-04-22 NOTE — Progress Notes (Signed)
   Subjective:    Patient ID: Brad May, male    DOB: Feb 22, 1940, 77 y.o.   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  He was last seen in September 2016.  He follows with Dr. Laurance Flatten for primary care Dr. Benay Spice for oncology.  He is now been seeing annually by oncology.  He reports feeling very well from a GI perspective.  He has very short but intermittent bouts with internal hemorrhoid symptoms but these are minor.  He denies change in bowel habit including diarrhea, constipation, blood in his stool or melena.  No abdominal pain.  No upper GI or hepatobiliary complaint.  He reports having fecal occult blood testing about every 6 months with Dr. Laurance Flatten and these have been negative.  His last colonoscopy was on 07/31/2010 with Dr. Sharlett Iles.  This showed the mucosal abnormalities later found to be lymphoma.  No polyps were found at this time.  He does have a more remote history of colon polyps having had a tubular adenoma at colonoscopy in 2003 and 2009.  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  Review of Systems As per HPI, otherwise negative  Current Medications, Allergies, Past Medical History, Past Surgical History, Family History and Social History were reviewed in [employer] record.     Objective:   Physical Exam BP 124/74   Pulse 67   Ht 5\' 8"  (1.727 m)   Wt 171 lb 6.4 oz (77.7 kg)   SpO2 98%   BMI 26.06 kg/m  Constitutional: Well-developed and well-nourished. No distress. HEENT: Normocephalic and atraumatic. Oropharynx is clear and moist. Conjunctivae are normal.  No scleral icterus. Neck: Neck supple. Trachea midline. Cardiovascular: Normal rate, regular rhythm and intact  distal pulses. No M/R/G Pulmonary/chest: Effort normal and breath sounds normal. No wheezing, rales or rhonchi. Abdominal: Soft, nontender, nondistended. Bowel sounds active throughout. There are no masses palpable. No hepatosplenomegaly. Extremities: no clubbing, cyanosis, or edema Neurological: Alert and oriented to person place and time. Skin: Skin is warm and dry. Psychiatric: Normal mood and affect. Behavior is normal.       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 Personal hx of adenomatous colon polyps/history of lymphoma involving the colon --his last colonoscopy was about 7 years ago.  He does have a history of adenomatous polyps.  We discussed screening and surveillance today and after this discussion he would like to proceed with surveillance colonoscopy.  This is very reasonable.  We discussed the risk, benefits and alternatives and he is agreeable to proceed.

## 2017-04-22 NOTE — Patient Instructions (Addendum)
If you are age 78 or older, your body mass index should be between 23-30. Your Body mass index is 26.06 kg/m. If this is out of the aforementioned range listed, please consider follow up with your Primary Care Provider.  If you are age 48 or younger, your body mass index should be between 19-25. Your Body mass index is 26.06 kg/m. If this is out of the aformentioned range listed, please consider follow up with your Primary Care Provider.   You have been scheduled for a colonoscopy. Please follow written instructions given to you at your visit today.  Please pick up your prep supplies at the pharmacy within the next 1-3 days. If you use inhalers (even only as needed), please bring them with you on the day of your procedure. Your physician has requested that you go to www.startemmi.com and enter the access code given to you at your visit today. This web site gives a general overview about your procedure. However, you should still follow specific instructions given to you by our office regarding your preparation for the procedure.  We have sent the following medications to your pharmacy for you to pick up at your convenience: Suprep  Thank you for choosing [facility], Dr. Hilarie Fredrickson

## 2017-05-13 ENCOUNTER — Telehealth: Payer: Self-pay | Admitting: Family Medicine

## 2017-05-13 NOTE — Telephone Encounter (Signed)
LM 4/29-jhb

## 2017-05-15 NOTE — Telephone Encounter (Signed)
appt tomorrow

## 2017-05-16 ENCOUNTER — Telehealth: Payer: Self-pay | Admitting: Internal Medicine

## 2017-05-16 ENCOUNTER — Ambulatory Visit (INDEPENDENT_AMBULATORY_CARE_PROVIDER_SITE_OTHER): Payer: Medicare Other | Admitting: Family Medicine

## 2017-05-16 ENCOUNTER — Encounter: Payer: Self-pay | Admitting: Family Medicine

## 2017-05-16 ENCOUNTER — Ambulatory Visit (INDEPENDENT_AMBULATORY_CARE_PROVIDER_SITE_OTHER): Payer: Medicare Other

## 2017-05-16 VITALS — BP 113/78 | HR 65 | Temp 96.6°F | Ht 68.0 in | Wt 171.0 lb

## 2017-05-16 DIAGNOSIS — G629 Polyneuropathy, unspecified: Secondary | ICD-10-CM

## 2017-05-16 DIAGNOSIS — R200 Anesthesia of skin: Secondary | ICD-10-CM

## 2017-05-16 DIAGNOSIS — G4709 Other insomnia: Secondary | ICD-10-CM | POA: Diagnosis not present

## 2017-05-16 DIAGNOSIS — M47812 Spondylosis without myelopathy or radiculopathy, cervical region: Secondary | ICD-10-CM | POA: Diagnosis not present

## 2017-05-16 MED ORDER — TRAMADOL HCL 50 MG PO TABS: 50.0000 mg | ORAL_TABLET | Freq: Every day | ORAL | 0 refills | Status: DC | PRN

## 2017-05-16 MED ORDER — SUPREP BOWEL PREP KIT 17.5-3.13-1.6 GM/177ML PO SOLN: 1.0000 | ORAL | 0 refills | Status: DC

## 2017-05-16 NOTE — Patient Instructions (Signed)
Call with the radiological interpretation of the x-rays as soon as those become available We may put you on a short course of prednisone based on the radiology x-ray report We may consider getting an MRI Continue with extra strength Tylenol

## 2017-05-16 NOTE — Telephone Encounter (Signed)
Rx sent 

## 2017-05-16 NOTE — Progress Notes (Signed)
Subjective:    Patient ID: Brad May, male    DOB: 11-25-1940, 77 y.o.   MRN: [id number]  HPI Patient here today for right arm pain and numbness.  4 months and seems to be worse when he is sitting or laying down and he cannot sleep at night because of the worsening pain.  He does not recall any specific injury.  The pain is worse to the right of the lower cervical spine and seems to radiate down to his right thumb area.    Patient Active Problem List   Diagnosis Date Noted  . 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 BPH (benign prostatic hyperplasia) 12/17/2012  . Hyperlipidemia 12/17/2012  . Hypothyroidism 12/17/2012  . Vitamin D deficiency 12/17/2012  . 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 Nonspecific (abnormal) findings on radiological and other examination of gastrointestinal tract 09/25/2010  . Colitis 09/25/2010  . Gastritis and duodenitis 09/25/2010  . Polyp of stomach 09/25/2010   Outpatient Encounter Medications as of 05/16/2017  Medication Sig  . aspirin 81 MG tablet Take 81 mg by mouth daily.    Marland Kitchen atorvastatin (LIPITOR) 80 MG tablet TAKE 1 TABLET BY MOUTH EVERY DAY  . b complex vitamins tablet Take 1 tablet by mouth daily.    . Cholecalciferol (VITAMIN D-1000 MAX ST) 1000 units tablet Take 2,000 Units by mouth daily.   . Coenzyme Q10 (CO Q 10) 100 MG CAPS Take by mouth.  . fish oil-omega-3 fatty acids 1000 MG capsule Take 1 g by mouth daily.    Marland Kitchen levothyroxine (SYNTHROID, LEVOTHROID) 88 MCG tablet Take 1 tablet (88 mcg total) by mouth daily.  . niacin (NIASPAN) 500 MG CR tablet TAKE 1 TABLET (500 MG TOTAL) BY MOUTH AT BEDTIME.  . Probiotic Product (PROBIOTIC-10 PO) Take 1 capsule by mouth daily.  . sildenafil (REVATIO) 20 MG tablet TAKE 2- 5 TABLETS AS NEEDED FOR SEXUAL ACTIVITY.  Marland Kitchen SUPREP BOWEL PREP KIT 17.5-3.13-1.6 GM/177ML SOLN Take 1 kit by mouth as directed. For colonoscopy prep  .  [DISCONTINUED] levothyroxine (SYNTHROID, LEVOTHROID) 75 MCG tablet TAKE 1 TABLET BY MOUTH EVERY DAY BEFORE BREAKFAST   No facility-administered encounter medications on file as of 05/16/2017.       Review of Systems  Constitutional: Negative.   HENT: Negative.   Eyes: Negative.   Respiratory: Negative.   Cardiovascular: Negative.   Gastrointestinal: Negative.   Endocrine: Negative.   Genitourinary: Negative.   Musculoskeletal: Positive for arthralgias (right arm pain and numbness ).  Skin: Negative.   Allergic/Immunologic: Negative.   Neurological: Negative.   Hematological: Negative.   Psychiatric/Behavioral: Negative.        Objective:   Physical Exam  Constitutional: He is oriented to person, place, and time. He appears well-developed and well-nourished. No distress.  HENT:  Head: Normocephalic and atraumatic.  Eyes: Pupils are equal, round, and reactive to light. Conjunctivae and EOM are normal. Right eye exhibits no discharge. Left eye exhibits no discharge. No scleral icterus.  Neck: Normal range of motion. Neck supple. No thyromegaly present.  Musculoskeletal: Normal range of motion. He exhibits tenderness. He exhibits no edema.  Lymphadenopathy:    He has no cervical adenopathy.  Neurological: He is alert and oriented to person, place, and time. He displays normal reflexes. No cranial nerve deficit.  Skin: Skin is warm and dry. No rash noted.  Psychiatric: He has a normal mood and affect. His behavior is normal. Judgment and thought  content normal.  Nursing note and vitals reviewed.   BP 113/78 (BP Location: Right Arm)   Pulse 65   Temp (!) 96.6 F (35.9 C) (Oral)   Ht '5\' 8"'  (1.727 m)   Wt 171 lb (77.6 kg)   BMI 26.00 kg/m        Assessment & Plan:  1. Right arm numbness -This pain is worse with laying or sitting and is becoming much more severe to the point he is unable to rest. - DG Cervical Spine Complete; Future -Prescription for tramadol will be  called into use if the 2 Tylenol that he is taking currently do not help for pain -Pending results of x-rays we may need to get an MRI  2. Neuropathy -Continue with Tylenol and as needed tramadol -We may consider a short course of prednisone pending results of x-rays  3. Other insomnia -Continue with extra strength Tylenol and supplement with tramadol if needed  No orders of the defined types were placed in this encounter.  Patient Instructions  Call with the radiological interpretation of the x-rays as soon as those become available We may put you on a short course of prednisone based on the radiology x-ray report We may consider getting an MRI Continue with extra strength Tylenol  Arrie Senate MD

## 2017-05-17 ENCOUNTER — Other Ambulatory Visit: Payer: Self-pay | Admitting: *Deleted

## 2017-05-17 DIAGNOSIS — M5030 Other cervical disc degeneration, unspecified cervical region: Secondary | ICD-10-CM

## 2017-05-17 DIAGNOSIS — M9981 Other biomechanical lesions of cervical region: Principal | ICD-10-CM

## 2017-05-17 DIAGNOSIS — R200 Anesthesia of skin: Secondary | ICD-10-CM

## 2017-05-17 DIAGNOSIS — M4802 Spinal stenosis, cervical region: Secondary | ICD-10-CM

## 2017-05-17 MED ORDER — PREDNISONE 10 MG PO TABS: ORAL_TABLET | ORAL | 0 refills | Status: DC

## 2017-05-22 ENCOUNTER — Ambulatory Visit (AMBULATORY_SURGERY_CENTER): Payer: Medicare Other | Admitting: Internal Medicine

## 2017-05-22 ENCOUNTER — Other Ambulatory Visit: Payer: Self-pay

## 2017-05-22 ENCOUNTER — Encounter: Payer: Self-pay | Admitting: Internal Medicine

## 2017-05-22 VITALS — BP 135/86 | HR 62 | Temp 98.6°F | Resp 30 | Ht 68.0 in | Wt 171.0 lb

## 2017-05-22 DIAGNOSIS — D124 Benign neoplasm of descending colon: Secondary | ICD-10-CM

## 2017-05-22 DIAGNOSIS — D122 Benign neoplasm of ascending colon: Secondary | ICD-10-CM | POA: Diagnosis not present

## 2017-05-22 DIAGNOSIS — Z86010 Personal history of colonic polyps: Secondary | ICD-10-CM | POA: Diagnosis not present

## 2017-05-22 DIAGNOSIS — D123 Benign neoplasm of transverse colon: Secondary | ICD-10-CM

## 2017-05-22 DIAGNOSIS — Z1211 Encounter for screening for malignant neoplasm of colon: Secondary | ICD-10-CM | POA: Diagnosis not present

## 2017-05-22 MED ORDER — SODIUM CHLORIDE 0.9 % IV SOLN: 500.0000 mL | Freq: Once | INTRAVENOUS | Status: DC

## 2017-05-22 NOTE — Op Note (Signed)
Brad May Patient Name: Brad May Procedure Date: 05/22/2017 3:56 PM MRN: [id number] Endoscopist: Jerene Bears , MD Age: 77 Referring MD:  Date of Birth: 06-Dec-1940 Gender: Male Account #: [id number] Procedure:                Colonoscopy Indications:              Surveillance: Personal history of adenomatous                            polyps on last colonoscopy > 5 years ago (remote                            adenomas in 2003 and 2009); history of lymphoma                            involving the colon at colonoscopy in 2012 Medicines:                Monitored Anesthesia Care Procedure:                Pre-Anesthesia Assessment:                           - Prior to the procedure, a History and Physical                            was performed, and patient medications and                            allergies were reviewed. The patient's tolerance of                            previous anesthesia was also reviewed. The risks                            and benefits of the procedure and the sedation                            options and risks were discussed with the patient.                            All questions were answered, and informed consent                            was obtained. Prior Anticoagulants: The patient has                            taken no previous anticoagulant or antiplatelet                            agents. ASA Grade Assessment: II - A patient with                            mild systemic disease. After reviewing the risks  and benefits, the patient was deemed in                            satisfactory condition to undergo the procedure.                           After obtaining informed consent, the colonoscope                            was passed under direct vision. Throughout the                            procedure, the patient's blood pressure, pulse, and                            oxygen saturations were  monitored continuously. The                            Colonoscope was introduced through the anus and                            advanced to the cecum, identified by appendiceal                            orifice and ileocecal valve. The colonoscopy was                            performed without difficulty. The patient tolerated                            the procedure well. The quality of the bowel                            preparation was good. The ileocecal valve,                            appendiceal orifice, and rectum were photographed. Scope In: 4:13:45 PM Scope Out: 4:28:29 PM Scope Withdrawal Time: 0 hours 11 minutes 38 seconds  Total Procedure Duration: 0 hours 14 minutes 44 seconds  Findings:                 The digital rectal exam was normal.                           A 6 mm polyp was found in the ascending colon. The                            polyp was sessile. The polyp was removed with a                            cold snare. Resection and retrieval were complete.                           Two sessile polyps were found in  the transverse                            colon. The polyps were 4 to 5 mm in size. These                            polyps were removed with a cold snare. Resection                            and retrieval were complete.                           A 5 mm polyp was found in the descending colon. The                            polyp was sessile. The polyp was removed with a                            cold snare. Resection and retrieval were complete.                           Multiple small and large-mouthed diverticula were                            found in the sigmoid colon and descending colon.                           Internal hemorrhoids were found during                            retroflexion. The hemorrhoids were small.                           The exam was otherwise normal throughout the                            examined  colon. Complications:            No immediate complications. Estimated Blood Loss:     Estimated blood loss was minimal. Impression:               - One 6 mm polyp in the ascending colon, removed                            with a cold snare. Resected and retrieved.                           - Two 4 to 5 mm polyps in the transverse colon,                            removed with a cold snare. Resected and retrieved.                           - One 5 mm polyp in the descending colon, removed  with a cold snare. Resected and retrieved.                           - Diverticulosis in the sigmoid colon and in the                            descending colon.                           - Small internal hemorrhoids.                           - Colonic mucosa otherwise normal. Recommendation:           - Patient has a contact number available for                            emergencies. The signs and symptoms of potential                            delayed complications were discussed with the                            patient. Return to normal activities tomorrow.                            Written discharge instructions were provided to the                            patient.                           - Resume previous diet.                           - Continue present medications.                           - Await pathology results.                           - Repeat colonoscopy is recommended. The                            colonoscopy date will be determined after pathology                            results from today's exam become available for                            review. Jerene Bears, MD 05/22/2017 4:34:46 PM This report has been signed electronically.

## 2017-05-22 NOTE — Progress Notes (Signed)
Called to room to assist during endoscopic procedure.  Patient ID and intended procedure confirmed with present staff. Received instructions for my participation in the procedure from the performing physician.  

## 2017-05-22 NOTE — Patient Instructions (Signed)
YOU HAD AN ENDOSCOPIC PROCEDURE TODAY AT [facility]:   Refer to the procedure report that was given to you for any specific questions about what was found during the examination.  If the procedure report does not answer your questions, please call your gastroenterologist to clarify.  If you requested that your care partner not be given the details of your procedure findings, then the procedure report has been included in a sealed envelope for you to review at your convenience later.  YOU SHOULD EXPECT: Some feelings of bloating in the abdomen. Passage of more gas than usual.  Walking can help get rid of the air that was put into your GI tract during the procedure and reduce the bloating. If you had a lower endoscopy (such as a colonoscopy or flexible sigmoidoscopy) you may notice spotting of blood in your stool or on the toilet paper. If you underwent a bowel prep for your procedure, you may not have a normal bowel movement for a few days.  Please Note:  You might notice some irritation and congestion in your nose or some drainage.  This is from the oxygen used during your procedure.  There is no need for concern and it should clear up in a day or so.  SYMPTOMS TO REPORT IMMEDIATELY:   Following lower endoscopy (colonoscopy or flexible sigmoidoscopy):  Excessive amounts of blood in the stool  Significant tenderness or worsening of abdominal pains  Swelling of the abdomen that is new, acute  Fever of 100F or higher   For urgent or emergent issues, a gastroenterologist can be reached at any hour by calling [phone].   DIET:  We do recommend a small meal at first, but then you may proceed to your regular diet.  Drink plenty of fluids but you should avoid alcoholic beverages for 24 hours.  ACTIVITY:  You should plan to take it easy for the rest of today and you should NOT DRIVE or use heavy machinery until tomorrow (because of the sedation medicines used during the test).     FOLLOW UP: Our staff will call the number listed on your records the next business day following your procedure to check on you and address any questions or concerns that you may have regarding the information given to you following your procedure. If we do not reach you, we will leave a message.  However, if you are feeling well and you are not experiencing any problems, there is no need to return our call.  We will assume that you have returned to your regular daily activities without incident.  If any biopsies were taken you will be contacted by phone or by letter within the next 1-3 weeks.  Please call us at [phone] if you have not heard about the biopsies in 3 weeks.    SIGNATURES/CONFIDENTIALITY: You and/or your care partner have signed paperwork which will be entered into your electronic medical record.  These signatures attest to the fact that that the information above on your After Visit Summary has been reviewed and is understood.  Full responsibility of the confidentiality of this discharge information lies with you and/or your care-partner.  Polyp, diverticulosis, and hemorhoid information given

## 2017-05-22 NOTE — Progress Notes (Signed)
A/ox3 pleased with MAC, report to Jane RN 

## 2017-05-23 ENCOUNTER — Telehealth: Payer: Self-pay | Admitting: *Deleted

## 2017-05-23 NOTE — Telephone Encounter (Signed)
  Follow up Call-  Call back number 05/22/2017  Post procedure Call Back phone  # #[phone] hm  Permission to leave phone message Yes  Some recent data might be hidden     Patient questions:  Do you have a fever, pain , or abdominal swelling? No. Pain Score  0 *  Have you tolerated food without any problems? Yes.    Have you been able to return to your normal activities? Yes.    Do you have any questions about your discharge instructions: Diet   No. Medications  No. Follow up visit  No.  Do you have questions or concerns about your Care? No.  Actions: * If pain score is 4 or above: No action needed, pain <4.

## 2017-05-27 ENCOUNTER — Other Ambulatory Visit: Payer: Self-pay | Admitting: Family Medicine

## 2017-05-27 ENCOUNTER — Ambulatory Visit (HOSPITAL_COMMUNITY): Payer: Medicare Other

## 2017-05-27 ENCOUNTER — Telehealth: Payer: Self-pay | Admitting: Family Medicine

## 2017-05-27 ENCOUNTER — Encounter: Payer: Self-pay | Admitting: Internal Medicine

## 2017-05-27 NOTE — Telephone Encounter (Signed)
Spoke with pt - he is aware that we may have to have Dr Laurance Flatten call. He has a letter that was in his mailbox today - when he got home from [place]  - that states the denial.  He will bring the letter next Monday when he is here for AWV

## 2017-06-03 ENCOUNTER — Ambulatory Visit (INDEPENDENT_AMBULATORY_CARE_PROVIDER_SITE_OTHER): Payer: Medicare Other

## 2017-06-03 ENCOUNTER — Encounter: Payer: Self-pay | Admitting: *Deleted

## 2017-06-03 VITALS — BP 123/77 | HR 62 | Temp 98.6°F | Ht 68.0 in | Wt 168.0 lb

## 2017-06-03 DIAGNOSIS — Z0000 Encounter for general adult medical examination without abnormal findings: Secondary | ICD-10-CM

## 2017-06-03 NOTE — Patient Instructions (Signed)
  Brad May , Thank you for taking time to come for your Medicare Wellness Visit. I appreciate your ongoing commitment to your health goals. Please review the following plan we discussed and let me know if I can assist you in the future.   These are the goals we discussed: Goals    . DIET - EAT MORE FRUITS AND VEGETABLES    . Exercise 150 min/wk Moderate Activity    . Weight (lb) < 170 lb (77.1 kg)       This is a list of the screening recommended for you and due dates:  Health Maintenance  Topic Date Due  . Pneumonia vaccines (2 of 2 - PPSV23) 01/11/2018*  . Flu Shot  08/15/2017  . Tetanus Vaccine  11/14/2021  . Colon Cancer Screening  05/29/2022  *Topic was postponed. The date shown is not the original due date.

## 2017-06-03 NOTE — Progress Notes (Addendum)
Subjective:   Brad May is a 77 y.o. male who presents for Medicare Annual/Subsequent preventive examination. He currently resides in [place] with his wife. 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 He has received his doctorate. He currently still serves on the [employer]. He has two children, a son that lives close by and a daughter that passed away from suicide. He and his wife currently have no pets but have had pets in the past. He enjoys walking and does so on a regular basis 4 times a week. He push mows his yard for additional exercise and during the winter he goes to the [employer] and walks there. He enjoys playing golf and working on classic cars. He has two that he uses to participate in cruise ins. He is up to date on all immunizations and tests.   Review of Systems:   Cardiac Risk Factors include: advanced age (>34men, >40 women);dyslipidemia;male gender     Objective:    Vitals: BP 123/77   Pulse 62   Temp 98.6 F (37 C) (Oral)   Ht 5\' 8"  (1.727 m)   Wt 168 lb (76.2 kg)   BMI 25.54 kg/m   Body mass index is 25.54 kg/m.  Advanced Directives 06/03/2017 05/25/2016 02/09/2016 05/11/2015 10/21/2014 07/23/2014 10/21/2013  Does Patient Have a Medical Advance Directive? Yes Yes Yes Yes Yes Yes Yes  Type of Advance Directive Living will Living will Living will [place];Living will [place];Living will [place] -  Does patient want to make changes to medical advance directive? - - - - No - Patient declined - -  Copy of [place] in Chart? - - - No - copy requested No - copy requested No - copy requested No - copy requested   Patient states that he currently has an advanced directive in place and does not want to make changes.  Tobacco Social History   Tobacco Use  Smoking Status Never Smoker    Smokeless Tobacco Never Used     Patient has no history of smoking  Clinical Intake:  Pre-visit preparation completed: No  Pain : No/denies pain     BMI - recorded: 26 Nutritional Status: BMI 25 -29 Overweight Nutritional Risks: None Diabetes: No  How often do you need to have someone help you when you read instructions, pamphlets, or other written materials from your doctor or pharmacy?: 1 - Never What is the last grade level you completed in school?: College graduate  Interpreter Needed?: No     Past Medical History:  Diagnosis Date  . BPH (benign prostatic hyperplasia)   . Cataract    never had any removed  . Duodenitis   . Hyperlipidemia   . 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 Periumbilical hernia   . Thyroid disease    Past Surgical History:  Procedure Laterality Date  . APPENDECTOMY    . COLONOSCOPY    . COLONOSCOPY W/ POLYPECTOMY    . EYE SURGERY     drooping eyelids  . INGUINAL HERNIA REPAIR  2006   left  . POLYPECTOMY     Family History  Problem Relation Age of Onset  . Heart disease Mother   . Heart failure Mother        age 8  . Heart disease Father   . Heart attack  Father 86  . COPD Brother   . Suicidality Daughter   . Dementia Paternal Grandfather   . Colon cancer Neg Hx   . Stomach cancer Neg Hx   . Esophageal cancer Neg Hx   . Liver cancer Neg Hx   . Pancreatic cancer Neg Hx   . Prostate cancer Neg Hx   . Rectal cancer Neg Hx   . Ulcerative colitis Neg Hx    Social History   Socioeconomic History  . Marital status: Married    Spouse name: Not on file  . Number of children: 2  . Years of education: Not on file  . Highest education level: Doctorate  Occupational History  . Occupation: High school Principal  Social Needs  . Financial resource strain: Not hard at all  . Food insecurity:    Worry: Never true    Inability: Never true  . Transportation needs:    Medical: No    Non-medical: No  Tobacco Use  . Smoking  status: Never Smoker  . Smokeless tobacco: Never Used  Substance and Sexual Activity  . Alcohol use: Yes    Comment: 1-2 month  . Drug use: No  . Sexual activity: Yes  Lifestyle  . Physical activity:    Days per week: 4 days    Minutes per session: 30 min  . Stress: Not at all  Relationships  . Social connections:    Talks on phone: More than three times a week    Gets together: More than three times a week    Attends religious service: More than 4 times per year    Active member of club or organization: Yes    Attends meetings of clubs or organizations: More than 4 times per year    Relationship status: Married  Other Topics Concern  . Not on file  Social History Narrative  . Not on file    Outpatient Encounter Medications as of 06/03/2017  Medication Sig  . aspirin 81 MG tablet Take 81 mg by mouth daily.    Marland Kitchen atorvastatin (LIPITOR) 80 MG tablet TAKE 1 TABLET BY MOUTH EVERY DAY  . b complex vitamins tablet Take 1 tablet by mouth daily.    . Cholecalciferol (VITAMIN D-1000 MAX ST) 1000 units tablet Take 2,000 Units by mouth daily.   . Coenzyme Q10 (CO Q 10) 100 MG CAPS Take by mouth.  . fish oil-omega-3 fatty acids 1000 MG capsule Take 1 g by mouth daily.    Marland Kitchen levothyroxine (SYNTHROID, LEVOTHROID) 88 MCG tablet Take 1 tablet (88 mcg total) by mouth daily.  . niacin (NIASPAN) 500 MG CR tablet TAKE 1 TABLET (500 MG TOTAL) BY MOUTH AT BEDTIME.  . Probiotic Product (PROBIOTIC-10 PO) Take 1 capsule by mouth daily.  . sildenafil (REVATIO) 20 MG tablet TAKE 2- 5 TABLETS AS NEEDED FOR SEXUAL ACTIVITY.  . [DISCONTINUED] predniSONE (DELTASONE) 10 MG tablet Take 1 tab QID x 2 days, then 1 tab TID x 2 days, then 1 tab BID x 2 days, then 1 tab QD x 2 days  . [DISCONTINUED] traMADol (ULTRAM) 50 MG tablet Take 1 tablet (50 mg total) by mouth daily as needed.   Facility-Administered Encounter Medications as of 06/03/2017  Medication  . 0.9 %  sodium chloride infusion    Activities of  Daily Living In your present state of health, do you have any difficulty performing the following activities: 06/03/2017  Hearing? N  Vision? Y  Comment Wears glasses all the time  Difficulty concentrating or making decisions? N  Walking or climbing stairs? N  Dressing or bathing? N  Doing errands, shopping? N  Preparing Food and eating ? N  Using the Toilet? N  In the past six months, have you accidently leaked urine? N  Do you have problems with loss of bowel control? N  Managing your Medications? N  Managing your Finances? N  Housekeeping or managing your Housekeeping? N  Some recent data might be hidden    Patient Care Team: [name], MD as PCP - General (Family Medicine) Pyrtle, [name], MD as Consulting Physician (Gastroenterology) [name], MD as Consulting Physician (Oncology)   Assessment:   This is a routine wellness examination for Brad May.  Exercise Activities and Dietary recommendations Current Exercise Habits: Home exercise routine, Type of exercise: walking, Time (Minutes): 30, Frequency (Times/Week): 4, Weekly Exercise (Minutes/Week): 120, Intensity: Moderate, Exercise limited by: None identified  Goals    . DIET - EAT MORE FRUITS AND VEGETABLES    . Exercise 150 min/wk Moderate Activity    . Weight (lb) < 170 lb (77.1 kg)       Fall Risk Fall Risk  06/03/2017 05/16/2017 01/11/2017 07/04/2016 05/25/2016  Falls in the past year? No No No No No   Is the patient's home free of loose throw rugs in walkways, pet beds, electrical cords, etc?   yes      Grab bars in the bathroom? no      Handrails on the stairs?   yes      Adequate lighting?   yes    Depression Screen PHQ 2/9 Scores 06/03/2017 05/16/2017 01/11/2017 07/04/2016  PHQ - 2 Score 0 0 0 0    Cognitive Function MMSE - Mini Mental State Exam 06/03/2017 05/25/2016 10/21/2014  Orientation to time 5 5 4   Orientation to Place 5 5 5   Registration 3 3 3   Attention/ Calculation 4 5 5   Recall 3 3 3     Language- name 2 objects 2 2 2   Language- repeat 1 1 1   Language- follow 3 step command 3 3 3   Language- read & follow direction 1 1 1   Write a sentence 1 1 1   Copy design 1 1 1   Total score 29 30 29         Immunization History  Administered Date(s) Administered  . Influenza, High Dose Seasonal PF 11/09/2016  . Influenza,inj,Quad PF,6+ Mos 10/21/2013, 10/21/2014, 11/11/2015  . Pneumococcal Conjugate-13 12/17/2012  . Pneumococcal Polysaccharide-23 11/15/2004  . Tdap 11/15/2011  . Zoster 07/10/2006    Qualifies for Shingles Vaccine? Will discuss shingrix vaccine with PCP during next office visit.  Screening Tests Health Maintenance  Topic Date Due  . PNA vac Low Risk Adult (2 of 2 - PPSV23) 01/11/2018 (Originally 12/17/2013)  . INFLUENZA VACCINE  08/15/2017  . TETANUS/TDAP  11/14/2021  . COLONOSCOPY  05/29/2022   Cancer Screenings: Lung: Low Dose CT Chest recommended if Age 3-80 years, 30 pack-year currently smoking OR have quit w/in 15years. Patient does not qualify. Colorectal: Patient states he had colonoscopy done 3 weeks ago  Additional Screenings:  Hepatitis C Screening:  Patient does not fall in the guidelines for this bloodwork      Plan:     I have personally reviewed and noted the following in the patient's chart:   . Medical and social history . Use of alcohol, tobacco or illicit drugs  . Current medications and supplements . Functional ability and status . Nutritional  status . Physical activity . Advanced directives . List of other physicians . Hospitalizations, surgeries, and ER visits in previous 12 months . Vitals . Screenings to include cognitive, depression, and falls . Referrals and appointments  In addition, I have reviewed and discussed with patient certain preventive protocols, quality metrics, and best practice recommendations. A written personalized care plan for preventive services as well as general preventive health recommendations  were provided to patient.     Rolena Infante, LPN  09/02/4035   I have reviewed and agree with the above AWV documentation.   Arrie Senate MD

## 2017-07-11 ENCOUNTER — Encounter: Payer: Self-pay | Admitting: Family Medicine

## 2017-07-11 ENCOUNTER — Ambulatory Visit (INDEPENDENT_AMBULATORY_CARE_PROVIDER_SITE_OTHER): Payer: Medicare Other | Admitting: Family Medicine

## 2017-07-11 VITALS — BP 125/83 | HR 70 | Temp 97.0°F | Ht 68.0 in | Wt 172.0 lb

## 2017-07-11 DIAGNOSIS — N400 Enlarged prostate without lower urinary tract symptoms: Secondary | ICD-10-CM | POA: Diagnosis not present

## 2017-07-11 DIAGNOSIS — R200 Anesthesia of skin: Secondary | ICD-10-CM | POA: Diagnosis not present

## 2017-07-11 DIAGNOSIS — E559 Vitamin D deficiency, unspecified: Secondary | ICD-10-CM

## 2017-07-11 DIAGNOSIS — M9981 Other biomechanical lesions of cervical region: Secondary | ICD-10-CM | POA: Diagnosis not present

## 2017-07-11 DIAGNOSIS — M4802 Spinal stenosis, cervical region: Secondary | ICD-10-CM

## 2017-07-11 DIAGNOSIS — M5030 Other cervical disc degeneration, unspecified cervical region: Secondary | ICD-10-CM | POA: Diagnosis not present

## 2017-07-11 DIAGNOSIS — G629 Polyneuropathy, unspecified: Secondary | ICD-10-CM

## 2017-07-11 DIAGNOSIS — C8590 Non-Hodgkin lymphoma, unspecified, unspecified site: Secondary | ICD-10-CM | POA: Diagnosis not present

## 2017-07-11 DIAGNOSIS — E7800 Pure hypercholesterolemia, unspecified: Secondary | ICD-10-CM | POA: Diagnosis not present

## 2017-07-11 DIAGNOSIS — E039 Hypothyroidism, unspecified: Secondary | ICD-10-CM | POA: Diagnosis not present

## 2017-07-11 MED ORDER — PREDNISONE 10 MG PO TABS
ORAL_TABLET | ORAL | 0 refills | Status: DC
Start: 2017-07-11 — End: 2017-11-18

## 2017-07-11 NOTE — Progress Notes (Signed)
Subjective:    Patient ID: Brad May, male    DOB: [birth year], 77 y.o.   MRN: [id number]  HPI  Pt here for follow up and management of chronic medical problems which includes hypothyroid and hyperlipidemia. He is taking medication regularly.  The patient continues to have problems with his right arm.  He has had C-spine films which showed degenerative changes.  He continues to be having problems and he has had a course of prednisone.  Patient says the pain is especially bad at nighttime and he cannot rest.  There is a temporary improvement with the prednisone but then the pain has come back at nighttime.  He has numbness in the arm during the day.  There is not anything he can do to make it better.  He denies any chest pain pressure tightness or shortness of breath.  He denies any trouble with his intestinal tract including nausea vomiting diarrhea blood in the stool or black tarry bowel movements.  We did review the recent colonoscopy report which had him having multiple tubular adenomas and he thinks he has to have a repeat colonoscopy in 5 years.  He is passing his water well.     Patient Active Problem List   Diagnosis Date Noted  . 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 BPH (benign prostatic hyperplasia) 12/17/2012  . Hyperlipidemia 12/17/2012  . Hypothyroidism 12/17/2012  . Vitamin D deficiency 12/17/2012  . 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 Nonspecific (abnormal) findings on radiological and other examination of gastrointestinal tract 09/25/2010  . Colitis 09/25/2010  . Gastritis and duodenitis 09/25/2010  . Polyp of stomach 09/25/2010   Outpatient Encounter Medications as of 07/11/2017  Medication Sig  . aspirin 81 MG tablet Take 81 mg by mouth daily.    Marland Kitchen atorvastatin (LIPITOR) 80 MG tablet TAKE 1 TABLET BY MOUTH EVERY DAY  . b complex vitamins tablet Take 1 tablet by mouth daily.    . Cholecalciferol  (VITAMIN D-1000 MAX ST) 1000 units tablet Take 2,000 Units by mouth daily.   . Coenzyme Q10 (CO Q 10) 100 MG CAPS Take by mouth.  . fish oil-omega-3 fatty acids 1000 MG capsule Take 1 g by mouth daily.    Marland Kitchen levothyroxine (SYNTHROID, LEVOTHROID) 88 MCG tablet Take 1 tablet (88 mcg total) by mouth daily.  . niacin (NIASPAN) 500 MG CR tablet TAKE 1 TABLET (500 MG TOTAL) BY MOUTH AT BEDTIME.  . Probiotic Product (PROBIOTIC-10 PO) Take 1 capsule by mouth daily.  . sildenafil (REVATIO) 20 MG tablet TAKE 2- 5 TABLETS AS NEEDED FOR SEXUAL ACTIVITY.   Facility-Administered Encounter Medications as of 07/11/2017  Medication  . 0.9 %  sodium chloride infusion     Review of Systems  Constitutional: Negative.   HENT: Negative.   Eyes: Negative.   Respiratory: Negative.   Cardiovascular: Negative.   Gastrointestinal: Negative.   Endocrine: Negative.   Genitourinary: Negative.   Musculoskeletal: Positive for arthralgias (right arm pain ).  Skin: Negative.   Allergic/Immunologic: Negative.   Neurological: Negative.   Hematological: Negative.   Psychiatric/Behavioral: Negative.        Objective:   Physical Exam  Constitutional: He is oriented to person, place, and time. He appears well-developed and well-nourished. No distress.  The patient is pleasant and relaxed other than being bothered with this neck discomfort and numbness and pain in the right arm.  HENT:  Head: Normocephalic and atraumatic.  Right Ear:  External ear normal.  Left Ear: External ear normal.  Nose: Nose normal.  Mouth/Throat: Oropharynx is clear and moist. No oropharyngeal exudate.  Eyes: Pupils are equal, round, and reactive to light. Conjunctivae and EOM are normal. Right eye exhibits no discharge. Left eye exhibits no discharge. No scleral icterus.  Neck: Normal range of motion. Neck supple. No thyromegaly present.  Cardiovascular: Normal rate, regular rhythm, normal heart sounds and intact distal pulses.  No murmur  heard. Heart has a regular rate and rhythm at 72/min good radial pulses bilaterally and good pedal pulses bilaterally  Pulmonary/Chest: Effort normal and breath sounds normal. He has no wheezes. He has no rales.  No axillary adenopathy with good breath sounds anteriorly and posteriorly  Abdominal: Soft. Bowel sounds are normal. He exhibits no mass. There is no tenderness. There is no rebound and no guarding.  No liver or spleen enlargement.  No epigastric tenderness.  No inguinal adenopathy with normal bowel sounds.  Musculoskeletal: Normal range of motion. He exhibits tenderness. He exhibits no edema.  Minimal tenderness right posterior neck  Lymphadenopathy:    He has no cervical adenopathy.  Neurological: He is alert and oriented to person, place, and time. He has normal reflexes. No cranial nerve deficit.  Reflexes in right upper extremity diminished compared to reflexes in left upper extremity  Skin: Skin is warm and dry. No rash noted.  Psychiatric: He has a normal mood and affect. His behavior is normal. Judgment and thought content normal.  Patient's mood and affect and behavior are normal.  Nursing note and vitals reviewed.   BP 125/83 (BP Location: Right Arm)   Pulse 70   Temp (!) 97 F (36.1 C) (Oral)   Ht '5\' 8"'  (1.727 m)   Wt 172 lb (78 kg)   BMI 26.15 kg/m        Assessment & Plan:  1. Pure hypercholesterolemia -10 you current treatment and as aggressive therapeutic lifestyle changes as possible pending results of lab work - BMP8+EGFR - CBC with Differential/Platelet - Lipid panel - Hepatic function panel  2. Hypothyroidism, unspecified type -Continue current treatment pending results of lab work - CBC with Differential/Platelet - Thyroid Panel With TSH  3. Vitamin D deficiency -Continue with vitamin D replacement pending results of lab work - CBC with Differential/Platelet - VITAMIN D 25 Hydroxy (Vit-D Deficiency, Fractures)  4. Benign prostatic  hyperplasia, unspecified whether lower urinary tract symptoms present -No complaints today with voiding - CBC with Differential/Platelet  5. DDD (degenerative disc disease), cervical -Patient has ongoing problems with neck including pain and numbness in the right arm. -Recent MRI request was denied and we will re-request this again as patient is having ongoing symptoms despite treatment.  6. Right arm numbness -Get MRI and do referral to neurosurgery after MRI is obtained  7. Neuropathy -Get MRI and refer to neurosurgery  8. Neuroforaminal stenosis of cervical spine -Get MRI to further look at anatomy to see what is needed to help relieve the patient of pain and numbness  9. 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  In doing so we not only want to address your current medical conditions and concerns but also to detect  new conditions early and prevent illness, disease and health-related problems.    Medicare offers a yearly Wellness Visit which allows our clinical staff to assess your need for preventative services including immunizations, lifestyle education, counseling to decrease risk of preventable diseases and screening for fall risk and other medical concerns.    This visit is provided free of charge (no copay) for all Medicare recipients. The clinical pharmacists at [facility] have begun to conduct these Wellness Visits which will also include a thorough review of all your medications.    As you primary medical provider recommend that you make an appointment for your Annual Wellness Visit if you have not done so already this year.  You may set up this appointment before you leave today or you may call back ([phone]) and schedule an  appointment.  Please make sure when you call that you mention that you are scheduling your Annual Wellness Visit with the clinical pharmacist so that the appointment may be made for the proper length of time.     Continue current medications. Continue good therapeutic lifestyle changes which include good diet and exercise. Fall precautions discussed with patient. If an FOBT was given today- please return it to our front desk. If you are over 20 years old - you may need Prevnar 77 or the adult Pneumonia vaccine.  **Flu shots are available--- please call and schedule a FLU-CLINIC appointment**  After your visit with [place] today you will receive a survey in the mail or online from [employer] regarding your care with [place]. Please take a moment to fill this out. Your feedback is very important to [place] as you can help [place] better understand your patient needs as well as improve your experience and satisfaction. WE CARE ABOUT YOU!!!   We will try to get the MRI as you have continue to have problems with neuropathy pain and numbness in the right arm. Once we get the MRI we will schedule you for a visit with a neurosurgeon for possible injections. Need to follow aggressive therapeutic lifestyle changes as much as possible watching diet closely and getting exercise that does not irritate the neck and avoid heavy lifting. We will call with lab work results as soon as those results become available Continue to follow-up with gastroenterologist as recommended for future colonoscopies Continue to follow-up with oncologist because of indolent lymphoma  Arrie Senate MD

## 2017-07-11 NOTE — Patient Instructions (Addendum)
Medicare Annual Wellness Visit  [facility] and the medical providers at [facility] strive to bring you the best medical care.  In doing so we not only want to address your current medical conditions and concerns but also to detect new conditions early and prevent illness, disease and health-related problems.    Medicare offers a yearly Wellness Visit which allows our clinical staff to assess your need for preventative services including immunizations, lifestyle education, counseling to decrease risk of preventable diseases and screening for fall risk and other medical concerns.    This visit is provided free of charge (no copay) for all Medicare recipients. The clinical pharmacists at [facility] have begun to conduct these Wellness Visits which will also include a thorough review of all your medications.    As you primary medical provider recommend that you make an appointment for your Annual Wellness Visit if you have not done so already this year.  You may set up this appointment before you leave today or you may call back ([phone]) and schedule an appointment.  Please make sure when you call that you mention that you are scheduling your Annual Wellness Visit with the clinical pharmacist so that the appointment may be made for the proper length of time.     Continue current medications. Continue good therapeutic lifestyle changes which include good diet and exercise. Fall precautions discussed with patient. If an FOBT was given today- please return it to our front desk. If you are over 37 years old - you may need Prevnar 54 or the adult Pneumonia vaccine.  **Flu shots are available--- please call and schedule a FLU-CLINIC appointment**  After your visit with [place] today you will receive a survey in the mail or online from [employer] regarding your care with [place]. Please take a moment to fill this out. Your feedback is very  important to [place] as you can help [place] better understand your patient needs as well as improve your experience and satisfaction. WE CARE ABOUT YOU!!!   We will try to get the MRI as you have continue to have problems with neuropathy pain and numbness in the right arm. Once we get the MRI we will schedule you for a visit with a neurosurgeon for possible injections. Need to follow aggressive therapeutic lifestyle changes as much as possible watching diet closely and getting exercise that does not irritate the neck and avoid heavy lifting. We will call with lab work results as soon as those results become available Continue to follow-up with gastroenterologist as recommended for future colonoscopies Continue to follow-up with oncologist because of indolent lymphoma

## 2017-07-11 NOTE — Addendum Note (Signed)
Addended by: Zannie Cove on: 07/11/2017 09:18 AM   Modules accepted: Orders

## 2017-07-12 ENCOUNTER — Telehealth: Payer: Self-pay | Admitting: Family Medicine

## 2017-07-12 LAB — CBC WITH DIFFERENTIAL/PLATELET
BASOS: 1 %
Basophils Absolute: 0 10*3/uL (ref 0.0–0.2)
EOS (ABSOLUTE): 0.4 10*3/uL (ref 0.0–0.4)
EOS: 7 %
HEMATOCRIT: 44.2 % (ref 37.5–51.0)
Hemoglobin: 15.4 g/dL (ref 13.0–17.7)
Immature Grans (Abs): 0 10*3/uL (ref 0.0–0.1)
Immature Granulocytes: 0 %
LYMPHS ABS: 1.7 10*3/uL (ref 0.7–3.1)
Lymphs: 32 %
MCH: 32.3 pg (ref 26.6–33.0)
MCHC: 34.8 g/dL (ref 31.5–35.7)
MCV: 93 fL (ref 79–97)
MONOS ABS: 0.6 10*3/uL (ref 0.1–0.9)
Monocytes: 12 %
Neutrophils Absolute: 2.5 10*3/uL (ref 1.4–7.0)
Neutrophils: 48 %
Platelets: 200 10*3/uL (ref 150–450)
RBC: 4.77 x10E6/uL (ref 4.14–5.80)
RDW: 13.8 % (ref 12.3–15.4)
WBC: 5.2 10*3/uL (ref 3.4–10.8)

## 2017-07-12 LAB — THYROID PANEL WITH TSH
FREE THYROXINE INDEX: 2.1 (ref 1.2–4.9)
T3 Uptake Ratio: 27 % (ref 24–39)
T4, Total: 7.8 ug/dL (ref 4.5–12.0)
TSH: 2.96 u[IU]/mL (ref 0.450–4.500)

## 2017-07-12 LAB — BMP8+EGFR
BUN/Creatinine Ratio: 17 (ref 10–24)
BUN: 15 mg/dL (ref 8–27)
CO2: 22 mmol/L (ref 20–29)
Calcium: 9.4 mg/dL (ref 8.6–10.2)
Chloride: 106 mmol/L (ref 96–106)
Creatinine, Ser: 0.9 mg/dL (ref 0.76–1.27)
GFR calc Af Amer: 95 mL/min/{1.73_m2} (ref >59)
GFR calc non Af Amer: 82 mL/min/{1.73_m2} (ref >59)
Glucose: 100 mg/dL — ABNORMAL HIGH (ref 65–99)
Potassium: 4.6 mmol/L (ref 3.5–5.2)
Sodium: 143 mmol/L (ref 134–144)

## 2017-07-12 LAB — LIPID PANEL
Chol/HDL Ratio: 4.2 ratio (ref 0.0–5.0)
Cholesterol, Total: 161 mg/dL (ref 100–199)
HDL: 38 mg/dL — ABNORMAL LOW (ref >39)
LDL Calculated: 102 mg/dL — ABNORMAL HIGH (ref 0–99)
Triglycerides: 105 mg/dL (ref 0–149)
VLDL Cholesterol Cal: 21 mg/dL (ref 5–40)

## 2017-07-12 LAB — HEPATIC FUNCTION PANEL
ALBUMIN: 4.4 g/dL (ref 3.5–4.8)
ALT: 30 IU/L (ref 0–44)
AST: 28 IU/L (ref 0–40)
Alkaline Phosphatase: 113 IU/L (ref 39–117)
BILIRUBIN TOTAL: 0.9 mg/dL (ref 0.0–1.2)
Bilirubin, Direct: 0.22 mg/dL (ref 0.00–0.40)
TOTAL PROTEIN: 6.6 g/dL (ref 6.0–8.5)

## 2017-07-12 LAB — VITAMIN D 25 HYDROXY (VIT D DEFICIENCY, FRACTURES): Vit D, 25-Hydroxy: 47.9 ng/mL (ref 30.0–100.0)

## 2017-07-12 NOTE — Telephone Encounter (Signed)
No VM for this line. - aware to check mychart

## 2017-07-24 ENCOUNTER — Telehealth: Payer: Self-pay | Admitting: Family Medicine

## 2017-07-24 NOTE — Telephone Encounter (Signed)
Patient states he would like for Roselyn Reef to call him when she is back next week. Aware she is out of the office this week and still would like for her to call him when he returns.

## 2017-07-30 NOTE — Telephone Encounter (Signed)
Note printed for referrals - we are trying to get MRI approved.

## 2017-08-15 ENCOUNTER — Other Ambulatory Visit: Payer: Self-pay | Admitting: Family Medicine

## 2017-08-21 ENCOUNTER — Ambulatory Visit (HOSPITAL_COMMUNITY)
Admission: RE | Admit: 2017-08-21 | Discharge: 2017-08-21 | Disposition: A | Discharge status: Home / Self Care | Payer: Medicare Other | Source: Ambulatory Visit | Attending: Family Medicine | Admitting: Family Medicine

## 2017-08-21 DIAGNOSIS — R200 Anesthesia of skin: Secondary | ICD-10-CM | POA: Insufficient documentation

## 2017-08-21 DIAGNOSIS — M47812 Spondylosis without myelopathy or radiculopathy, cervical region: Secondary | ICD-10-CM | POA: Insufficient documentation

## 2017-08-21 DIAGNOSIS — M4802 Spinal stenosis, cervical region: Secondary | ICD-10-CM

## 2017-08-21 DIAGNOSIS — M5030 Other cervical disc degeneration, unspecified cervical region: Secondary | ICD-10-CM | POA: Diagnosis not present

## 2017-08-21 DIAGNOSIS — M50223 Other cervical disc displacement at C6-C7 level: Secondary | ICD-10-CM | POA: Diagnosis not present

## 2017-08-21 DIAGNOSIS — M9981 Other biomechanical lesions of cervical region: Secondary | ICD-10-CM | POA: Diagnosis not present

## 2017-08-22 ENCOUNTER — Other Ambulatory Visit: Payer: Self-pay | Admitting: *Deleted

## 2017-08-22 DIAGNOSIS — M4802 Spinal stenosis, cervical region: Secondary | ICD-10-CM

## 2017-08-22 DIAGNOSIS — G629 Polyneuropathy, unspecified: Secondary | ICD-10-CM

## 2017-08-22 DIAGNOSIS — M9981 Other biomechanical lesions of cervical region: Secondary | ICD-10-CM

## 2017-08-22 DIAGNOSIS — M5030 Other cervical disc degeneration, unspecified cervical region: Secondary | ICD-10-CM

## 2017-08-22 DIAGNOSIS — R200 Anesthesia of skin: Secondary | ICD-10-CM

## 2017-08-23 ENCOUNTER — Other Ambulatory Visit: Payer: Self-pay | Admitting: Family Medicine

## 2017-10-01 DIAGNOSIS — R030 Elevated blood-pressure reading, without diagnosis of hypertension: Secondary | ICD-10-CM | POA: Diagnosis not present

## 2017-10-01 DIAGNOSIS — G5601 Carpal tunnel syndrome, right upper limb: Secondary | ICD-10-CM | POA: Diagnosis not present

## 2017-10-01 DIAGNOSIS — G5600 Carpal tunnel syndrome, unspecified upper limb: Secondary | ICD-10-CM

## 2017-10-01 DIAGNOSIS — Z6826 Body mass index (BMI) 26.0-26.9, adult: Secondary | ICD-10-CM | POA: Diagnosis not present

## 2017-10-01 HISTORY — DX: Carpal tunnel syndrome, unspecified upper limb: G56.00

## 2017-10-03 DIAGNOSIS — H25813 Combined forms of age-related cataract, bilateral: Secondary | ICD-10-CM | POA: Diagnosis not present

## 2017-10-03 DIAGNOSIS — H524 Presbyopia: Secondary | ICD-10-CM | POA: Diagnosis not present

## 2017-10-03 DIAGNOSIS — H5212 Myopia, left eye: Secondary | ICD-10-CM | POA: Diagnosis not present

## 2017-10-03 DIAGNOSIS — H43813 Vitreous degeneration, bilateral: Secondary | ICD-10-CM | POA: Diagnosis not present

## 2017-10-03 DIAGNOSIS — H52223 Regular astigmatism, bilateral: Secondary | ICD-10-CM | POA: Diagnosis not present

## 2017-10-09 ENCOUNTER — Telehealth: Payer: Self-pay | Admitting: Oncology

## 2017-10-09 NOTE — Telephone Encounter (Signed)
[facility] PAL 10/25 - moved appointments to 11/4. Spoke with patient.

## 2017-11-04 ENCOUNTER — Other Ambulatory Visit: Payer: Self-pay | Admitting: Family Medicine

## 2017-11-05 NOTE — Telephone Encounter (Signed)
Last seen 07/11/17

## 2017-11-08 ENCOUNTER — Other Ambulatory Visit: Payer: Medicare Other

## 2017-11-08 ENCOUNTER — Ambulatory Visit: Payer: Medicare Other | Admitting: Oncology

## 2017-11-18 ENCOUNTER — Inpatient Hospital Stay (HOSPITAL_BASED_OUTPATIENT_CLINIC_OR_DEPARTMENT_OTHER): Payer: Medicare Other | Admitting: Oncology

## 2017-11-18 ENCOUNTER — Telehealth: Payer: Self-pay | Admitting: Oncology

## 2017-11-18 ENCOUNTER — Inpatient Hospital Stay: Payer: Medicare Other | Attending: Oncology

## 2017-11-18 VITALS — BP 133/76 | HR 70 | Temp 97.5°F | Resp 18 | Ht 68.0 in | Wt 172.1 lb

## 2017-11-18 DIAGNOSIS — C8380 Other non-follicular lymphoma, unspecified site: Secondary | ICD-10-CM

## 2017-11-18 DIAGNOSIS — Z23 Encounter for immunization: Secondary | ICD-10-CM | POA: Insufficient documentation

## 2017-11-18 DIAGNOSIS — C884 Extranodal marginal zone B-cell lymphoma of mucosa-associated lymphoid tissue [MALT-lymphoma]: Secondary | ICD-10-CM | POA: Insufficient documentation

## 2017-11-18 LAB — CBC WITH DIFFERENTIAL/PLATELET
ABS IMMATURE GRANULOCYTES: 0.01 10*3/uL (ref 0.00–0.07)
BASOS PCT: 1 %
Basophils Absolute: 0.1 10*3/uL (ref 0.0–0.1)
EOS ABS: 0.2 10*3/uL (ref 0.0–0.5)
EOS PCT: 3 %
HCT: 48.5 % (ref 39.0–52.0)
Hemoglobin: 16.6 g/dL (ref 13.0–17.0)
Immature Granulocytes: 0 %
Lymphocytes Relative: 34 %
Lymphs Abs: 2 10*3/uL (ref 0.7–4.0)
MCH: 32 pg (ref 26.0–34.0)
MCHC: 34.2 g/dL (ref 30.0–36.0)
MCV: 93.6 fL (ref 80.0–100.0)
MONO ABS: 0.6 10*3/uL (ref 0.1–1.0)
Monocytes Relative: 10 %
Neutro Abs: 3.1 10*3/uL (ref 1.7–7.7)
Neutrophils Relative %: 52 %
PLATELETS: 197 10*3/uL (ref 150–400)
RBC: 5.18 MIL/uL (ref 4.22–5.81)
RDW: 12.7 % (ref 11.5–15.5)
WBC: 5.9 10*3/uL (ref 4.0–10.5)
nRBC: 0 % (ref 0.0–0.2)

## 2017-11-18 MED ORDER — INFLUENZA VAC SPLIT HIGH-DOSE 0.5 ML IM SUSY: 0.5000 mL | PREFILLED_SYRINGE | INTRAMUSCULAR | Status: DC

## 2017-11-18 MED ORDER — INFLUENZA VAC SPLIT HIGH-DOSE 0.5 ML IM SUSY
0.5000 mL | PREFILLED_SYRINGE | Freq: Once | INTRAMUSCULAR | Status: AC
  Administered 2017-11-18: 0.5 mL via INTRAMUSCULAR
  Filled 2017-11-18: qty 0.5

## 2017-11-18 NOTE — Patient Instructions (Signed)
Please bring copy of Advanced Directives to next visit to have scanned into your chart.

## 2017-11-18 NOTE — Telephone Encounter (Signed)
Appts scheduled AVS/Calendar printed per 11/4 los °

## 2017-11-18 NOTE — Progress Notes (Signed)
  [facility] OFFICE PROGRESS NOTE   Diagnosis: Marginal zone lymphoma  INTERVAL HISTORY:   Brad May returns as scheduled.  He underwent a colonoscopy by Dr. Hilarie Fredrickson on 05/22/2017.  Multiple polyps were removed.  The pathology returned as tubular adenomas.  He feels well.  No bleeding.  Appetite and energy level.  Objective:  Vital signs in last 24 hours:  Blood pressure 133/76, pulse 70, temperature (!) 97.5 F (36.4 C), resp. rate 18, height 5\' 8"  (1.727 m), weight 172 lb 1.6 oz (78.1 kg), SpO2 100 %.    HEENT: Neck without mass Lymphatics: No cervical, supraclavicular, axillary, or inguinal nodes Resp: Lungs clear bilaterally Cardio: Regular rate and rhythm GI: No hepatosplenomegaly no mass, nontender Vascular: No leg edema    Lab Results:  Lab Results  Component Value Date   WBC 5.9 11/18/2017   HGB 16.6 11/18/2017   HCT 48.5 11/18/2017   MCV 93.6 11/18/2017   PLT 197 11/18/2017   NEUTROABS 3.1 11/18/2017    CMP  Lab Results  Component Value Date   NA 143 07/11/2017   K 4.6 07/11/2017   CL 106 07/11/2017   CO2 22 07/11/2017   GLUCOSE 100 (H) 07/11/2017   BUN 15 07/11/2017   CREATININE 0.90 07/11/2017   CALCIUM 9.4 07/11/2017   PROT 6.6 07/11/2017   ALBUMIN 4.4 07/11/2017   AST 28 07/11/2017   ALT 30 07/11/2017   ALKPHOS 113 07/11/2017   BILITOT 0.9 07/11/2017   GFRNONAA 82 07/11/2017   GFRAA 95 07/11/2017    Medications: I have reviewed the patient's current medications.   Assessment/Plan: 1. History of intermittent rectal bleeding.  2. 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  1. 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  2. CT of the abdomen and pelvis 08/04/2010  with abdominal/pelvic lymphadenopathy. 3. PET scan 09/13/2010 confirming mildly enlarged abdominal/pelvic lymph nodes with borderline increased FDG activity. 4. Initiation of single agent rituximab therapy on 11/28/2010, he completed 4 weekly treatments on 12/21/2010 5. 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 6. Cycle 1 bendamustine/rituximab 05/01/2011 7. Cycle 2 bendamustine/rituximab 05/29/2011 8. Cycle 3 bendamustine/rituximab 06/26/2011 9. Restaging PET scan 07/27/2011-no evidence of lymphoma 10. Restaging sigmoidoscopy 10/03/2011-normal colonic mucosa 11. Colonoscopy 05/22/2017- multiple polyps removed, tubular adenomas 3. History of iron deficiency. Ferritin was in the low normal range on 11/14/2010. Hemoglobin remains normal. 4. ? left upper lung nodule on a chest x-ray 09/81/1914-NW hypermetabolic lung nodule was seen on a PET scan 09/13/2010     Disposition: Brad May is in clinical remission from non-Hodgkin's lymphoma.  He would like to continue follow-up at the cancer center.  He will return for an office visit in 1 year. He received an influenza vaccine today.  He will continue colonoscopy surveillance with Dr. Hilarie Fredrickson.  15 minutes were spent with the patient today.  The majority of the time was used for counseling and coordination of care.  Betsy Coder, MD  11/18/2017  12:31 PM

## 2017-12-03 DIAGNOSIS — G5601 Carpal tunnel syndrome, right upper limb: Secondary | ICD-10-CM | POA: Diagnosis not present

## 2017-12-13 ENCOUNTER — Other Ambulatory Visit: Payer: Self-pay | Admitting: Family Medicine

## 2018-01-13 ENCOUNTER — Ambulatory Visit (INDEPENDENT_AMBULATORY_CARE_PROVIDER_SITE_OTHER): Payer: Medicare Other | Admitting: Family Medicine

## 2018-01-13 ENCOUNTER — Encounter: Payer: Self-pay | Admitting: Family Medicine

## 2018-01-13 VITALS — BP 117/73 | HR 72 | Temp 97.1°F | Ht 68.0 in | Wt 176.0 lb

## 2018-01-13 DIAGNOSIS — E039 Hypothyroidism, unspecified: Secondary | ICD-10-CM

## 2018-01-13 DIAGNOSIS — N400 Enlarged prostate without lower urinary tract symptoms: Secondary | ICD-10-CM | POA: Diagnosis not present

## 2018-01-13 DIAGNOSIS — C8288 Other types of follicular lymphoma, lymph nodes of multiple sites: Secondary | ICD-10-CM | POA: Diagnosis not present

## 2018-01-13 DIAGNOSIS — E559 Vitamin D deficiency, unspecified: Secondary | ICD-10-CM | POA: Diagnosis not present

## 2018-01-13 DIAGNOSIS — N5201 Erectile dysfunction due to arterial insufficiency: Secondary | ICD-10-CM

## 2018-01-13 DIAGNOSIS — E7800 Pure hypercholesterolemia, unspecified: Secondary | ICD-10-CM | POA: Diagnosis not present

## 2018-01-13 LAB — URINALYSIS, COMPLETE
BILIRUBIN UA: NEGATIVE
Glucose, UA: NEGATIVE
Ketones, UA: NEGATIVE
LEUKOCYTES UA: NEGATIVE
Nitrite, UA: NEGATIVE
Protein, UA: NEGATIVE
Specific Gravity, UA: 1.02 (ref 1.005–1.030)
Urobilinogen, Ur: 1 mg/dL (ref 0.2–1.0)
pH, UA: 5.5 (ref 5.0–7.5)

## 2018-01-13 LAB — MICROSCOPIC EXAMINATION
Epithelial Cells (non renal): NONE SEEN /hpf (ref 0–10)
Renal Epithel, UA: NONE SEEN /hpf
WBC, UA: NONE SEEN /hpf (ref 0–5)

## 2018-01-13 MED ORDER — LEVOTHYROXINE SODIUM 88 MCG PO TABS: 88.0000 ug | ORAL_TABLET | Freq: Every day | ORAL | 3 refills | Status: DC

## 2018-01-13 NOTE — Progress Notes (Signed)
Subjective:    Patient ID: Brad May, male    DOB: 11-27-1940, 77 y.o.   MRN: [id number]  HPI  Pt here for follow up and management of chronic medical problems which includes hyperlipidemia and hypothyroid. He is taking medication regularly.  Patient saw his oncologist, Dr. 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  He also seen the orthopedic surgeon who felt that the shoulder pain was secondary to carpal tunnel syndrome and this has improved with wearing a wrist brace at nighttime.  The patient has no specific complaints and is requesting a refill on his levothyroxine.  His vital signs are stable and his BMI is 26.17.  The patient today has no complaints other than not being quite as active as he should be.  He is very active during the summertime and tries to stay active in the gym up even in the wintertime.  He denies any chest pain pressure tightness or shortness of breath.  He denies any trouble with swallowing heartburn indigestion nausea vomiting diarrhea blood in the stool black tarry bowel movements or change in bowel habits.  He did have a colonoscopy in May of this year and was told that he would need another one in 3 years or in May 2022 by Dr. Hilarie Fredrickson.  He is passing his water without problems.    Patient Active Problem List   Diagnosis Date Noted  . 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 BPH (benign prostatic hyperplasia) 12/17/2012  . Hyperlipidemia 12/17/2012  . Hypothyroidism 12/17/2012  . Vitamin D deficiency 12/17/2012  . 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 Nonspecific (abnormal) findings on radiological and other examination of gastrointestinal tract 09/25/2010  . Colitis 09/25/2010  . Gastritis and duodenitis 09/25/2010  . Polyp of stomach 09/25/2010   Outpatient  Encounter Medications as of 01/13/2018  Medication Sig  . aspirin 81 MG tablet Take 81 mg by mouth daily.    Marland Kitchen atorvastatin (LIPITOR) 80 MG tablet TAKE 1 TABLET BY MOUTH EVERY DAY  . b complex vitamins tablet Take 1 tablet by mouth daily.    . Cholecalciferol (VITAMIN D-1000 MAX ST) 1000 units tablet Take 2,000 Units by mouth daily.   . fish oil-omega-3 fatty acids 1000 MG capsule Take 1 g by mouth daily.    Marland Kitchen levothyroxine (SYNTHROID, LEVOTHROID) 88 MCG tablet Take 1 tablet (88 mcg total) by mouth daily.  . niacin (NIASPAN) 500 MG CR tablet TAKE 1 TABLET (500 MG TOTAL) BY MOUTH AT BEDTIME.  . Probiotic Product (PROBIOTIC-10 PO) Take 1 capsule by mouth daily.  . sildenafil (REVATIO) 20 MG tablet TAKE 2-5 TABLETS BY MOUTH AS NEEDED FOR SEXUAL ACTIVITY  . [DISCONTINUED] Coenzyme Q10 (CO Q 10) 100 MG CAPS Take by mouth.  . [DISCONTINUED] 0.9 %  sodium chloride infusion    No facility-administered encounter medications on file as of 01/13/2018.      Review of Systems  Constitutional: Negative.   HENT: Negative.   Eyes: Negative.   Respiratory: Negative.   Cardiovascular: Negative.   Gastrointestinal: Negative.   Endocrine: Negative.   Genitourinary: Negative.   Musculoskeletal: Negative.   Skin: Negative.   Allergic/Immunologic: Negative.   Neurological: Negative.   Hematological: Negative.   Psychiatric/Behavioral: Negative.        Objective:   Physical Exam Vitals  signs and nursing note reviewed.  Constitutional:      Appearance: Normal appearance. He is well-developed and normal weight. He is not ill-appearing.     Comments: Patient is pleasant and doing well and positive about his health care  HENT:     Head: Normocephalic and atraumatic.     Right Ear: Tympanic membrane, ear canal and external ear normal. There is no impacted cerumen.     Left Ear: Tympanic membrane, ear canal and external ear normal. There is no impacted cerumen.     Nose: Nose normal. No congestion or  rhinorrhea.     Mouth/Throat:     Mouth: Mucous membranes are moist.     Pharynx: Oropharynx is clear. No oropharyngeal exudate.  Eyes:     General: No scleral icterus.       Right eye: No discharge.        Left eye: No discharge.     Extraocular Movements: Extraocular movements intact.     Conjunctiva/sclera: Conjunctivae normal.     Pupils: Pupils are equal, round, and reactive to light.  Neck:     Musculoskeletal: Normal range of motion and neck supple. No neck rigidity.     Thyroid: No thyromegaly.     Vascular: No carotid bruit.     Trachea: No tracheal deviation.  Cardiovascular:     Rate and Rhythm: Normal rate and regular rhythm.     Pulses: Normal pulses.     Heart sounds: Normal heart sounds. No murmur. No gallop.      Comments: The heart is regular at 72/min without murmurs or gallops with good pedal pulses Pulmonary:     Effort: Pulmonary effort is normal. No respiratory distress.     Breath sounds: Normal breath sounds. No wheezing or rales.     Comments: Axillary adenopathy or chest wall masses and lungs were clear anteriorly and posteriorly Chest:     Chest wall: No tenderness.  Abdominal:     General: Abdomen is flat. Bowel sounds are normal.     Palpations: Abdomen is soft. There is no mass.     Tenderness: There is no abdominal tenderness.     Comments: No masses tenderness organ enlargement bruits or inguinal adenopathy present.  Genitourinary:    Penis: Normal.      Scrotum/Testes: Normal.     Rectum: Normal.     Comments: The prostate is enlarged but soft and smooth.  There were no inguinal hernias palpated.  The external genitalia were within normal limits and no inguinal adenopathy was palpable. Musculoskeletal: Normal range of motion.        General: No tenderness.     Right lower leg: No edema.     Left lower leg: No edema.  Lymphadenopathy:     Cervical: No cervical adenopathy.  Skin:    General: Skin is warm and dry.     Coloration: Skin is not  pale.     Findings: No erythema or rash.  Neurological:     General: No focal deficit present.     Mental Status: He is alert and oriented to person, place, and time. Mental status is at baseline.     Cranial Nerves: No cranial nerve deficit.     Deep Tendon Reflexes: Reflexes are normal and symmetric.     Comments: Reflexes were 2+ and equal bilaterally  Psychiatric:        Mood and Affect: Mood normal.        Behavior: Behavior  normal.        Thought Content: Thought content normal.        Judgment: Judgment normal.     Comments: Mood affect and behavior were all normal for this patient    BP 117/73 (BP Location: Left Arm)   Pulse 72   Temp (!) 97.1 F (36.2 C) (Oral)   Ht _0  (1.727 m)   Wt 176 lb (79.8 kg)   BMI 26.76 kg/m         Assessment & Plan:  1. Pure hypercholesterolemia -Continue with current treatment and as aggressive therapeutic lifestyle changes as possible including diet and exercise - EKG 12-Lead - CBC with Differential/Platelet - Lipid panel - Hepatic function panel - BMP8+EGFR  2. Hypothyroidism, unspecified type -Continue with current treatment pending results of lab work - CBC with Differential/Platelet - Thyroid Panel With TSH  3. Vitamin D deficiency -Continue with current treatment pending results of lab work - CBC with Differential/Platelet - VITAMIN D 25 Hydroxy (Vit-D Deficiency, Fractures)  4. Benign prostatic hyperplasia, unspecified whether lower urinary tract symptoms present -Do rectal exam yearly and PSA until 78 years of age and then on the rectal exams after that - CBC with Differential/Platelet - PSA, total and free - Urinalysis, Complete  5. Erectile dysfunction due to arterial insufficiency -Continue with current treatment  6. 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 levothyroxine  (SYNTHROID, LEVOTHROID) 88 MCG tablet    Sig: Take 1 tablet (88 mcg total) by mouth daily.    Dispense:  90 tablet    Refill:  3   Patient Instructions                       Medicare Annual Wellness Visit  [facility] and the medical providers at [facility] strive to bring you the best medical care.  In doing so we not only want to address your current medical conditions and concerns but also to detect new conditions early and prevent illness, disease and health-related problems.    Medicare offers a yearly Wellness Visit which allows our clinical staff to assess your need for preventative services including immunizations, lifestyle education, counseling to decrease risk of preventable diseases and screening for fall risk and other medical concerns.    This visit is provided free of charge (no copay) for all Medicare recipients. The clinical pharmacists at [facility] have begun to conduct these Wellness Visits which will also include a thorough review of all your medications.    As you primary medical provider recommend that you make an appointment for your Annual Wellness Visit if you have not done so already this year.  You may set up this appointment before you leave today or you may call back ([phone]) and schedule an appointment.  Please make sure when you call that you mention that you are scheduling your Annual Wellness Visit with the clinical pharmacist so that the appointment may be made for the proper length of time.     Continue current medications. Continue good therapeutic lifestyle changes which include good diet and exercise. Fall precautions discussed with patient. If an FOBT was given today- please return it to our front desk. If you are over 59 years old - you may need Prevnar 11 or the adult Pneumonia vaccine.  **Flu shots are available--- please call  and schedule a FLU-CLINIC appointment**  After your visit with [place] today  you will receive a survey in the mail or online from [employer] regarding your care with [place]. Please take a moment to fill this out. Your feedback is very important to [place] as you can help [place] better understand your patient needs as well as improve your experience and satisfaction. WE CARE ABOUT YOU!!!   Follow-up with gastroenterology in May 2022 for repeat colonoscopy Follow-up as needed with orthopedist Follow-up yearly with Dr. Malachy Mood and oncology in November Stay active physically, drink plenty of water, eat less sugar, and exercise regularly    Arrie Senate MD

## 2018-01-13 NOTE — Patient Instructions (Addendum)
Medicare Annual Wellness Visit  [facility] and the medical providers at [facility] strive to bring you the best medical care.  In doing so we not only want to address your current medical conditions and concerns but also to detect new conditions early and prevent illness, disease and health-related problems.    Medicare offers a yearly Wellness Visit which allows our clinical staff to assess your need for preventative services including immunizations, lifestyle education, counseling to decrease risk of preventable diseases and screening for fall risk and other medical concerns.    This visit is provided free of charge (no copay) for all Medicare recipients. The clinical pharmacists at [facility] have begun to conduct these Wellness Visits which will also include a thorough review of all your medications.    As you primary medical provider recommend that you make an appointment for your Annual Wellness Visit if you have not done so already this year.  You may set up this appointment before you leave today or you may call back ([phone]) and schedule an appointment.  Please make sure when you call that you mention that you are scheduling your Annual Wellness Visit with the clinical pharmacist so that the appointment may be made for the proper length of time.     Continue current medications. Continue good therapeutic lifestyle changes which include good diet and exercise. Fall precautions discussed with patient. If an FOBT was given today- please return it to our front desk. If you are over 56 years old - you may need Prevnar 52 or the adult Pneumonia vaccine.  **Flu shots are available--- please call and schedule a FLU-CLINIC appointment**  After your visit with [place] today you will receive a survey in the mail or online from [employer] regarding your care with [place]. Please take a moment to fill this out. Your feedback is very  important to [place] as you can help [place] better understand your patient needs as well as improve your experience and satisfaction. WE CARE ABOUT YOU!!!   Follow-up with gastroenterology in May 2022 for repeat colonoscopy Follow-up as needed with orthopedist Follow-up yearly with Dr. Malachy Mood and oncology in November Stay active physically, drink plenty of water, eat less sugar, and exercise regularly

## 2018-01-14 LAB — HEPATIC FUNCTION PANEL
ALT: 24 IU/L (ref 0–44)
AST: 22 IU/L (ref 0–40)
Albumin: 4.3 g/dL (ref 3.5–4.8)
Alkaline Phosphatase: 103 IU/L (ref 39–117)
Bilirubin Total: 0.6 mg/dL (ref 0.0–1.2)
Bilirubin, Direct: 0.16 mg/dL (ref 0.00–0.40)
Total Protein: 6.6 g/dL (ref 6.0–8.5)

## 2018-01-14 LAB — CBC WITH DIFFERENTIAL/PLATELET
BASOS ABS: 0.1 10*3/uL (ref 0.0–0.2)
Basos: 1 %
EOS (ABSOLUTE): 0.1 10*3/uL (ref 0.0–0.4)
Eos: 2 %
Hematocrit: 43.5 % (ref 37.5–51.0)
Hemoglobin: 15.6 g/dL (ref 13.0–17.7)
IMMATURE GRANS (ABS): 0 10*3/uL (ref 0.0–0.1)
Immature Granulocytes: 0 %
LYMPHS ABS: 1.7 10*3/uL (ref 0.7–3.1)
LYMPHS: 34 %
MCH: 32.6 pg (ref 26.6–33.0)
MCHC: 35.9 g/dL — AB (ref 31.5–35.7)
MCV: 91 fL (ref 79–97)
Monocytes Absolute: 0.6 10*3/uL (ref 0.1–0.9)
Monocytes: 12 %
NEUTROS ABS: 2.5 10*3/uL (ref 1.4–7.0)
Neutrophils: 51 %
PLATELETS: 205 10*3/uL (ref 150–450)
RBC: 4.79 x10E6/uL (ref 4.14–5.80)
RDW: 12.6 % (ref 12.3–15.4)
WBC: 5 10*3/uL (ref 3.4–10.8)

## 2018-01-14 LAB — LIPID PANEL
CHOLESTEROL TOTAL: 138 mg/dL (ref 100–199)
Chol/HDL Ratio: 3.4 ratio (ref 0.0–5.0)
HDL: 41 mg/dL (ref >39)
LDL Calculated: 77 mg/dL (ref 0–99)
Triglycerides: 102 mg/dL (ref 0–149)
VLDL Cholesterol Cal: 20 mg/dL (ref 5–40)

## 2018-01-14 LAB — BMP8+EGFR
BUN/Creatinine Ratio: 13 (ref 10–24)
BUN: 12 mg/dL (ref 8–27)
CO2: 17 mmol/L — ABNORMAL LOW (ref 20–29)
Calcium: 9.2 mg/dL (ref 8.6–10.2)
Chloride: 107 mmol/L — ABNORMAL HIGH (ref 96–106)
Creatinine, Ser: 0.9 mg/dL (ref 0.76–1.27)
GFR calc Af Amer: 94 mL/min/{1.73_m2} (ref >59)
GFR calc non Af Amer: 82 mL/min/{1.73_m2} (ref >59)
Glucose: 119 mg/dL — ABNORMAL HIGH (ref 65–99)
Potassium: 4.4 mmol/L (ref 3.5–5.2)
SODIUM: 141 mmol/L (ref 134–144)

## 2018-01-14 LAB — THYROID PANEL WITH TSH
Free Thyroxine Index: 2.2 (ref 1.2–4.9)
T3 Uptake Ratio: 27 % (ref 24–39)
T4, Total: 8.3 ug/dL (ref 4.5–12.0)
TSH: 5.22 u[IU]/mL — ABNORMAL HIGH (ref 0.450–4.500)

## 2018-01-14 LAB — PSA, TOTAL AND FREE
PROSTATE SPECIFIC AG, SERUM: 2.9 ng/mL (ref 0.0–4.0)
PSA, Free Pct: 24.5 %
PSA, Free: 0.71 ng/mL

## 2018-01-14 LAB — VITAMIN D 25 HYDROXY (VIT D DEFICIENCY, FRACTURES): Vit D, 25-Hydroxy: 34.6 ng/mL (ref 30.0–100.0)

## 2018-01-21 ENCOUNTER — Encounter: Payer: Self-pay | Admitting: *Deleted

## 2018-03-10 ENCOUNTER — Other Ambulatory Visit: Payer: Self-pay | Admitting: Family Medicine

## 2018-04-16 ENCOUNTER — Telehealth (INDEPENDENT_AMBULATORY_CARE_PROVIDER_SITE_OTHER): Payer: Medicare Other | Admitting: Cardiology

## 2018-04-16 ENCOUNTER — Encounter: Payer: Self-pay | Admitting: Cardiology

## 2018-04-16 DIAGNOSIS — E7849 Other hyperlipidemia: Secondary | ICD-10-CM

## 2018-04-16 DIAGNOSIS — Z136 Encounter for screening for cardiovascular disorders: Secondary | ICD-10-CM

## 2018-04-16 NOTE — Progress Notes (Signed)
Virtual Visit via Video Note    Evaluation Performed:  Follow-up visit  This visit type was conducted due to national recommendations for restrictions regarding the COVID-19 Pandemic (e.g. social distancing).  This format is felt to be most appropriate for this patient at this time.  All issues noted in this document were discussed and addressed.  No physical exam was performed (except for noted visual exam findings with Video Visits).  Please refer to the patient's chart (MyChart message for video visits and phone note for telephone visits) for the patient's consent to telehealth for [facility].  Date:  04/16/2018   ID:  Brad May, DOB [birth year], MRN [id number]  Patient Location:  Home address  Provider location:   [place]  PCP:  [name], MD  Cardiologist:  Dr. Percival Spanish Electrophysiologist:  None   Chief Complaint:  Dyslipidemia  History of Present Illness:    Brad May is a 77 y.o. male who presents via audio/video conferencing for a telehealth visit today.    The patient has multiple cardiovascular risk factors.  He has not had CAD.   He did have POET (Plain Old Exercise Treadmill) he thinks years ago and had no obvious abnormalities.  He is active.  He can push a mower.  He was exercising until the virus occurred.  The patient denies any new symptoms such as chest discomfort, neck or arm discomfort. There has been no new  PND or orthopnea. There have been no reported palpitations, presyncope or syncope.   The patient does not have symptoms concerning for COVID-19 infection (fever, chills, cough, or new shortness of breath).    Prior CV studies:   The following studies were reviewed today:   Past Medical History:  Diagnosis Date  . BPH (benign prostatic hyperplasia)   . Cataract    never had any removed  . Duodenitis   . Hyperlipidemia   . Non Hodgkin's lymphoma ([place]) 11/27/2010  . Periumbilical hernia   . Thyroid disease    Past  Surgical History:  Procedure Laterality Date  . APPENDECTOMY    . COLONOSCOPY    . COLONOSCOPY W/ POLYPECTOMY    . EYE SURGERY     drooping eyelids  . INGUINAL HERNIA REPAIR  2006   left  . POLYPECTOMY      Prior to Admission medications   Medication Sig Start Date End Date Taking? Authorizing Provider  aspirin 81 MG tablet Take 81 mg by mouth daily.      [provider]  atorvastatin (LIPITOR) 80 MG tablet TAKE 1 TABLET BY MOUTH EVERY DAY 08/15/17   [name], MD  b complex vitamins tablet Take 1 tablet by mouth daily.      [provider]  Cholecalciferol (VITAMIN D-1000 MAX ST) 1000 units tablet Take 2,000 Units by mouth daily.     [provider]  fish oil-omega-3 fatty acids 1000 MG capsule Take 1 g by mouth daily.      [provider]  levothyroxine (SYNTHROID, LEVOTHROID) 88 MCG tablet Take 1 tablet (88 mcg total) by mouth daily. 01/13/18   [name], MD  niacin (NIASPAN) 500 MG CR tablet TAKE 1 TABLET (500 MG TOTAL) BY MOUTH AT BEDTIME. 03/10/18   [name], MD  Probiotic Product (PROBIOTIC-10 PO) Take 1 capsule by mouth daily.    [provider]  sildenafil (REVATIO) 20 MG tablet TAKE 2-5 TABLETS BY MOUTH AS NEEDED FOR SEXUAL ACTIVITY 11/05/17  [name], MD     No outpatient medications have been marked as taking for the 04/16/18 encounter (Appointment) with Minus Breeding, MD.     Allergies:   Sulfonamide derivatives   Social History   Tobacco Use  . Smoking status: Never Smoker  . Smokeless tobacco: Never Used  Substance Use Topics  . Alcohol use: Yes    Comment: 1-2 month  . Drug use: No     Family Hx: The patient's family history includes COPD in his brother; Dementia in his paternal grandfather; Heart attack (age of onset: 12) in his father; Heart disease in his father and mother; Heart failure in his mother; Suicidality in his daughter. There is no history of Colon cancer, Stomach cancer,  Esophageal cancer, Liver cancer, Pancreatic cancer, Prostate cancer, Rectal cancer, or Ulcerative colitis.  ROS:   Please see the history of present illness.    As stated in the HPI and negative for all other systems.   Labs/Other Tests and Data Reviewed:    Recent Labs: 01/13/2018: ALT 24; BUN 12; Creatinine, Ser 0.90; Hemoglobin 15.6; Platelets 205; Potassium 4.4; Sodium 141; TSH 5.220   Recent Lipid Panel Lab Results  Component Value Date/Time   CHOL 138 01/13/2018 09:00 AM   TRIG 102 01/13/2018 09:00 AM   TRIG 124 01/11/2017 09:11 AM   HDL 41 01/13/2018 09:00 AM   HDL 41 01/11/2017 09:11 AM   CHOLHDL 3.4 01/13/2018 09:00 AM   LDLCALC 77 01/13/2018 09:00 AM   LDLCALC 92 07/08/2013 09:53 AM    Wt Readings from Last 3 Encounters:  01/13/18 176 lb (79.8 kg)  11/18/17 172 lb 1.6 oz (78.1 kg)  07/11/17 172 lb (78 kg)     Objective:    Vital Signs:  There were no vitals taken for this visit.   Well nourished, well developed male in no acute distress.   ASSESSMENT & PLAN:    DYSLIPIDEMIA:  The patient does have dyslipidemia.  However, I reviewed his lipid profile as above and his levels are excellent.  No change in therapy is indicated.   DOE:  He has some mild DOE and he wants to be very active.  He has significant cardiovascular risk factors.  Therefore, I will bring him back for a POET (Plain Old Exercise Treadmill).    COVID-19 Education: The signs and symptoms of COVID-19 were discussed with the patient and how to seek care for testing (follow up with PCP or arrange E-visit).  The importance of social distancing was discussed today.  Patient Risk:   After full review of this patient's clinical status, I feel that they are at least moderate risk at this time.  Time:   Today, I have spent 30 minutes with the patient with telehealth technology discussing the above and planning the procedure.     Medication Adjustments/Labs and Tests Ordered: Current medicines are  reviewed at length with the patient today.  Concerns regarding medicines are outlined above.  Tests Ordered: No orders of the defined types were placed in this encounter.  Medication Changes: No orders of the defined types were placed in this encounter.   Disposition:  Follow up as needed.   Signed, Minus Breeding, MD  04/16/2018 12:14 PM    [facility]

## 2018-04-16 NOTE — Patient Instructions (Addendum)
Medication Instructions:  The current medical regimen is effective;  continue present plan and medications.  If you need a refill on your cardiac medications before your next appointment, please call your pharmacy.   Testing/Procedures: Your physician has requested that you have an exercise tolerance test.  You will be called to be scheduled for this appointment.  The day of your appointment you may take all medications as listed.  Please do not eat or drink 4 hours before the treadmill test.  Wear 2 piece comfortable clothing and comfortable walking shoes.  If you have questions or need to reschedule please call.  Follow-Up: Follow up as needed with Dr Percival Spanish in [place], after your stress testing.  Thank you for choosing [facility]!!

## 2018-04-30 ENCOUNTER — Ambulatory Visit: Payer: Medicare Other | Admitting: Cardiology

## 2018-05-07 DIAGNOSIS — L905 Scar conditions and fibrosis of skin: Secondary | ICD-10-CM | POA: Diagnosis not present

## 2018-05-07 DIAGNOSIS — Z85828 Personal history of other malignant neoplasm of skin: Secondary | ICD-10-CM | POA: Diagnosis not present

## 2018-05-07 DIAGNOSIS — L821 Other seborrheic keratosis: Secondary | ICD-10-CM | POA: Diagnosis not present

## 2018-05-07 DIAGNOSIS — S80862A Insect bite (nonvenomous), left lower leg, initial encounter: Secondary | ICD-10-CM | POA: Diagnosis not present

## 2018-05-20 DIAGNOSIS — R200 Anesthesia of skin: Secondary | ICD-10-CM | POA: Insufficient documentation

## 2018-05-22 DIAGNOSIS — G5603 Carpal tunnel syndrome, bilateral upper limbs: Secondary | ICD-10-CM | POA: Diagnosis not present

## 2018-06-05 DIAGNOSIS — G5603 Carpal tunnel syndrome, bilateral upper limbs: Secondary | ICD-10-CM | POA: Diagnosis not present

## 2018-06-30 ENCOUNTER — Other Ambulatory Visit: Payer: Self-pay | Admitting: Family Medicine

## 2018-06-30 ENCOUNTER — Ambulatory Visit (INDEPENDENT_AMBULATORY_CARE_PROVIDER_SITE_OTHER): Payer: Medicare Other | Admitting: *Deleted

## 2018-06-30 ENCOUNTER — Other Ambulatory Visit: Payer: Self-pay

## 2018-06-30 VITALS — Ht 68.0 in | Wt 176.0 lb

## 2018-06-30 DIAGNOSIS — Z0000 Encounter for general adult medical examination without abnormal findings: Secondary | ICD-10-CM

## 2018-06-30 NOTE — Patient Instructions (Signed)
Mr. Minnehan , Thank you for taking time to come for your Medicare Wellness Visit. I appreciate your ongoing commitment to your health goals. Please review the following plan we discussed and let me know if I can assist you in the future.   These are the goals we discussed: Goals    . DIET - EAT MORE FRUITS AND VEGETABLES    . Exercise 150 min/wk Moderate Activity    . Increase physical activity     Would like to start running again- 5K    . Weight (lb) < 170 lb (77.1 kg)       This is a list of the screening recommended for you and due dates:  Health Maintenance  Topic Date Due  . Pneumonia vaccines (2 of 2 - PPSV23) 12/17/2013  . Flu Shot  08/16/2018  . Tetanus Vaccine  11/14/2021  . Colon Cancer Screening  05/29/2022     Advance Directive  Advance directives are legal documents that let you make choices ahead of time about your health care and medical treatment in case you become unable to communicate for yourself. Advance directives are a way for you to communicate your wishes to family, friends, and health care providers. This can help convey your decisions about end-of-life care if you become unable to communicate. Discussing and writing advance directives should happen over time rather than all at once. Advance directives can be changed depending on your situation and what you want, even after you have signed the advance directives. If you do not have an advance directive, some states assign family decision makers to act on your behalf based on how closely you are related to them. Each state has its own laws regarding advance directives. You may want to check with your health care provider, attorney, or state representative about the laws in your state. There are different types of advance directives, such as:  Medical power of attorney.  Living will.  Do not resuscitate (DNR) or do not attempt resuscitation (DNAR) order. Health care proxy and medical power of attorney A  health care proxy, also called a health care agent, is a person who is appointed to make medical decisions for you in cases in which you are unable to make the decisions yourself. Generally, people choose someone they know well and trust to represent their preferences. Make sure to ask this person for an agreement to act as your proxy. A proxy may have to exercise judgment in the event of a medical decision for which your wishes are not known. A medical power of attorney is a legal document that names your health care proxy. Depending on the laws in your state, after the document is written, it may also need to be:  Signed.  Notarized.  Dated.  Copied.  Witnessed.  Incorporated into your medical record. You may also want to appoint someone to manage your financial affairs in a situation in which you are unable to do so. This is called a durable power of attorney for finances. It is a separate legal document from the durable power of attorney for health care. You may choose the same person or someone different from your health care proxy to act as your agent in financial matters. If you do not appoint a proxy, or if there is a concern that the proxy is not acting in your best interests, a court-appointed guardian may be designated to act on your behalf. Living will A living will is a set of instructions  documenting your wishes about medical care when you cannot express them yourself. Health care providers should keep a copy of your living will in your medical record. You may want to give a copy to family members or friends. To alert caregivers in case of an emergency, you can place a card in your wallet to let them know that you have a living will and where they can find it. A living will is used if you become:  Terminally ill.  Incapacitated.  Unable to communicate or make decisions. Items to consider in your living will include:  The use or non-use of life-sustaining equipment, such as  dialysis machines and breathing machines (ventilators).  A DNR or DNAR order, which is the instruction not to use cardiopulmonary resuscitation (CPR) if breathing or heartbeat stops.  The use or non-use of tube feeding.  Withholding of food and fluids.  Comfort (palliative) care when the goal becomes comfort rather than a cure.  Organ and tissue donation. A living will does not give instructions for distributing your money and property if you should pass away. It is recommended that you seek the advice of a lawyer when writing a will. Decisions about taxes, beneficiaries, and asset distribution will be legally binding. This process can relieve your family and friends of any concerns surrounding disputes or questions that may come up about the distribution of your assets. DNR or DNAR A DNR or DNAR order is a request not to have CPR in the event that your heart stops beating or you stop breathing. If a DNR or DNAR order has not been made and shared, a health care provider will try to help any patient whose heart has stopped or who has stopped breathing. If you plan to have surgery, talk with your health care provider about how your DNR or DNAR order will be followed if problems occur. Summary  Advance directives are the legal documents that allow you to make choices ahead of time about your health care and medical treatment in case you become unable to communicate for yourself.  The process of discussing and writing advance directives should happen over time. You can change the advance directives, even after you have signed them.  Advance directives include DNR or DNAR orders, living wills, and designating an agent as your medical power of attorney. This information is not intended to replace advice given to you by your health care provider. Make sure you discuss any questions you have with your health care provider. Document Released: 04/10/2007 Document Revised: 11/21/2015 Document Reviewed:  11/21/2015 Elsevier Interactive Patient Education  2019 [employer].

## 2018-06-30 NOTE — Progress Notes (Signed)
MEDICARE ANNUAL WELLNESS VISIT  06/30/2018  Telephone Visit Disclaimer This Medicare AWV was conducted by telephone due to national recommendations for restrictions regarding the COVID-19 Pandemic (e.g. social distancing).  I verified, using two identifiers, that I am speaking with Brad May or their authorized healthcare agent. I discussed the limitations, risks, security, and privacy concerns of performing an evaluation and management service by telephone and the potential availability of an in-person appointment in the future. The patient expressed understanding and agreed to proceed.   Subjective:  Brad May is a 78 y.o. male patient of Brad Herb, MD who had a Medicare Annual Wellness Visit today via telephone. [name] is Retired and lives with their spouse. he has 2 children. he reports that he is socially active and does interact with friends/family regularly. he is moderately physically active and enjoys golf and car shows.  Patient Care Team: Brad Herb, MD as PCP - General (Family Medicine) Pyrtle, Brad Lines, MD as Consulting Physician (Gastroenterology) Brad Pier, MD as Consulting Physician (Oncology)  Advanced Directives 06/30/2018 11/18/2017 06/03/2017 05/25/2016 02/09/2016 05/11/2015 10/21/2014  Does Patient Have a Medical Advance Directive? No No;Yes Yes Yes Yes Yes Yes  Type of Advance Directive - [place];Living will Living will Living will Living will [place];Living will [place];Living will  Does patient want to make changes to medical advance directive? - No - Patient declined - - - - No - Patient declined  Copy of [place] in Chart? - No - copy requested - - - No - copy requested No - copy requested  Would patient like information on creating a medical advance directive? Yes (MAU/Ambulatory/Procedural Areas - Information given) - - - - - [facility] Utilization Over  the Past 12 Months: # of hospitalizations or ER visits: 0 # of surgeries: 0  Review of Systems    Patient reports that his overall health is unchanged compared to last year.  Patient Reported Readings (BP, Pulse, CBG, Weight, etc) none  Review of Systems: History obtained from chart review and the patient General ROS: negative  All other systems negative.  Pain Assessment Pain : No/denies pain     Current Medications & Allergies (verified) Allergies as of 06/30/2018      Reactions   Sulfonamide Derivatives    REACTION: unknown      Medication List       Accurate as of June 30, 2018 10:57 AM. If you have any questions, ask your nurse or doctor.        aspirin 81 MG tablet Take 81 mg by mouth daily.   atorvastatin 80 MG tablet Commonly known as: LIPITOR TAKE 1 TABLET BY MOUTH EVERY DAY   b complex vitamins tablet Take 1 tablet by mouth daily.   fish oil-omega-3 fatty acids 1000 MG capsule Take 1 g by mouth daily.   levothyroxine 88 MCG tablet Commonly known as: SYNTHROID Take 1 tablet (88 mcg total) by mouth daily.   niacin 500 MG CR tablet Commonly known as: NIASPAN TAKE 1 TABLET (500 MG TOTAL) BY MOUTH AT BEDTIME.   PROBIOTIC-10 PO Take 1 capsule by mouth daily.   sildenafil 20 MG tablet Commonly known as: REVATIO TAKE 2-5 TABLETS BY MOUTH AS NEEDED FOR SEXUAL ACTIVITY   Vitamin D-1000 Max St 25 MCG (1000 UT) tablet Generic drug: Cholecalciferol Take 2,000 Units by mouth daily.  History (reviewed): Past Medical History:  Diagnosis Date  . BPH (benign prostatic hyperplasia)   . Duodenitis   . Hyperlipidemia   . 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 Periumbilical hernia   . Thyroid disease    Past Surgical History:  Procedure Laterality Date  . APPENDECTOMY    . COLONOSCOPY    . COLONOSCOPY W/ POLYPECTOMY    . INGUINAL HERNIA REPAIR  2006   left  . POLYPECTOMY     Family History  Problem Relation Age of Onset  . Heart  disease Mother   . Heart failure Mother        age 44  . Heart disease Father   . Heart attack Father 22  . COPD Brother   . Suicidality Daughter   . Dementia Paternal Grandfather   . Colon cancer Neg Hx   . Stomach cancer Neg Hx   . Esophageal cancer Neg Hx   . Liver cancer Neg Hx   . Pancreatic cancer Neg Hx   . Prostate cancer Neg Hx   . Rectal cancer Neg Hx   . Ulcerative colitis Neg Hx    Social History   Socioeconomic History  . Marital status: Married    Spouse name: Not on file  . Number of children: 2  . Years of education: Not on file  . Highest education level: Doctorate  Occupational History  . Occupation: High school Principal  Social Needs  . Financial resource strain: Not hard at all  . Food insecurity    Worry: Never true    Inability: Never true  . Transportation needs    Medical: No    Non-medical: No  Tobacco Use  . Smoking status: Never Smoker  . Smokeless tobacco: Never Used  Substance and Sexual Activity  . Alcohol use: Yes    Comment: 1-2 month  . Drug use: No  . Sexual activity: Yes  Lifestyle  . Physical activity    Days per week: 4 days    Minutes per session: 30 min  . Stress: Not at all  Relationships  . Social connections    Talks on phone: More than three times a week    Gets together: More than three times a week    Attends religious service: More than 4 times per year    Active member of club or organization: Yes    Attends meetings of clubs or organizations: More than 4 times per year    Relationship status: Married  Other Topics Concern  . Not on file  Social History Narrative   Lives at home with wife.  Two children.  Daughter died suicide.  Two grands.  Retired Tourist information centre manager.      Activities of Daily Living In your present state of health, do you have any difficulty performing the following activities: 06/30/2018  Hearing? N  Vision? N  Comment Wears Classes  Difficulty concentrating or making decisions? N  Walking or  climbing stairs? N  Dressing or bathing? N  Doing errands, shopping? N  Preparing Food and eating ? N  Using the Toilet? N  In the past six months, have you accidently leaked urine? N  Do you have problems with loss of bowel control? N  Managing your Medications? N  Managing your Finances? N  Housekeeping or managing your Housekeeping? N  Some recent data might be hidden    Patient Literacy How often do you need to have someone help you when you read instructions, pamphlets,  or other written materials from your doctor or pharmacy?: 1 - Never  Exercise Current Exercise Habits: Home exercise routine, Type of exercise: walking, Time (Minutes): 60, Frequency (Times/Week): 5, Weekly Exercise (Minutes/Week): 300, Intensity: Mild, Exercise limited by: None identified  Diet Patient reports consuming 3 meals a day and 2 snack(s) a day Patient reports that his primary diet is: Regular Patient reports that she does have regular access to food.   Depression Screen PHQ 2/9 Scores 06/30/2018 01/13/2018 07/11/2017 06/03/2017 05/16/2017 01/11/2017 07/04/2016  PHQ - 2 Score 0 0 0 0 0 0 0     Fall Risk Fall Risk  06/30/2018 01/13/2018 07/11/2017 06/03/2017 05/16/2017  Falls in the past year? 0 0 No No No  Number falls in past yr: 0 - - - -  Injury with Fall? 0 - - - -     Objective:  Legacy A Slauson seemed alert and oriented and he participated appropriately during our telephone visit.  Blood Pressure Weight BMI  BP Readings from Last 3 Encounters:  01/13/18 117/73  11/18/17 133/76  07/11/17 125/83   Wt Readings from Last 3 Encounters:  06/30/18 176 lb (79.8 kg)  01/13/18 176 lb (79.8 kg)  11/18/17 172 lb 1.6 oz (78.1 kg)   BMI Readings from Last 1 Encounters:  06/30/18 26.76 kg/m    *Unable to obtain current vital signs, weight, and BMI due to telephone visit type  Hearing/Vision  . Syris did not seem to have difficulty with hearing/understanding during the telephone conversation .  Reports that he has not had a formal eye exam by an eye care professional within the past year . Reports that he has not had a formal hearing evaluation within the past year *Unable to fully assess hearing and vision during telephone visit type  Cognitive Function: 6CIT Screen 06/30/2018  What Year? 0 points  What month? 0 points  What time? 0 points  Count back from 20 0 points  Months in reverse 0 points  Repeat phrase 0 points  Total Score 0   (Normal:0-7, Significant for Dysfunction: >8)  Normal Cognitive Function Screening: Yes   Immunization & Health Maintenance Record Immunization History  Administered Date(s) Administered  . Influenza, High Dose Seasonal PF 11/09/2016, 11/18/2017  . Influenza,inj,Quad PF,6+ Mos 10/21/2013, 10/21/2014, 11/11/2015  . Pneumococcal Conjugate-13 12/17/2012  . Pneumococcal Polysaccharide-23 11/15/2004  . Tdap 11/15/2011  . Zoster 07/10/2006    Health Maintenance  Topic Date Due  . PNA vac Low Risk Adult (2 of 2 - PPSV23) 12/17/2013  . INFLUENZA VACCINE  08/16/2018  . TETANUS/TDAP  11/14/2021  . COLONOSCOPY  05/29/2022       Assessment  This is a routine wellness examination for Terrick A Longmire.  Health Maintenance: Due or Overdue Health Maintenance Due  Topic Date Due  . PNA vac Low Risk Adult (2 of 2 - PPSV23) 12/17/2013    Governor Rooks [place]es does not need a referral for Community Assistance: Care Management:   no Social Work:    no Prescription Assistance:  no Nutrition/Diabetes Education:  no   Plan:  Personalized Goals Goals Addressed   None    Personalized Health Maintenance & Screening Recommendations  Pneumococcal vaccine   Lung Cancer Screening Recommended: no (Low Dose CT Chest recommended if Age 64-80 years, 30 pack-year currently smoking OR have quit w/in past 15 years) Hepatitis C Screening recommended: no HIV Screening recommended: no  Advanced Directives: Written information was prepared per patient's  request.  Referrals & Orders  No orders of the defined types were placed in this encounter.   Follow-up Plan . Follow-up with Brad Herb, MD as planned    I have personally reviewed and noted the following in the patient's chart:   . Medical and social history . Use of alcohol, tobacco or illicit drugs  . Current medications and supplements . Functional ability and status . Nutritional status . Physical activity . Advanced directives . List of other physicians . Hospitalizations, surgeries, and ER visits in previous 12 months . Vitals . Screenings to include cognitive, depression, and falls . Referrals and appointments  In addition, I have reviewed and discussed with Governor Rooks [place] certain preventive protocols, quality metrics, and best practice recommendations. A written personalized care plan for preventive services as well as general preventive health recommendations is available and can be mailed to the patient at his request.      Wardell Heath, LPN  01/25/5518

## 2018-07-07 DIAGNOSIS — G5601 Carpal tunnel syndrome, right upper limb: Secondary | ICD-10-CM | POA: Diagnosis not present

## 2018-07-14 ENCOUNTER — Encounter: Payer: Self-pay | Admitting: Family Medicine

## 2018-07-14 ENCOUNTER — Other Ambulatory Visit: Payer: Self-pay

## 2018-07-14 ENCOUNTER — Ambulatory Visit (INDEPENDENT_AMBULATORY_CARE_PROVIDER_SITE_OTHER): Payer: Medicare Other | Admitting: Family Medicine

## 2018-07-14 DIAGNOSIS — K529 Noninfective gastroenteritis and colitis, unspecified: Secondary | ICD-10-CM | POA: Diagnosis not present

## 2018-07-14 DIAGNOSIS — M5030 Other cervical disc degeneration, unspecified cervical region: Secondary | ICD-10-CM | POA: Diagnosis not present

## 2018-07-14 DIAGNOSIS — N5201 Erectile dysfunction due to arterial insufficiency: Secondary | ICD-10-CM

## 2018-07-14 DIAGNOSIS — E034 Atrophy of thyroid (acquired): Secondary | ICD-10-CM | POA: Diagnosis not present

## 2018-07-14 DIAGNOSIS — E559 Vitamin D deficiency, unspecified: Secondary | ICD-10-CM | POA: Diagnosis not present

## 2018-07-14 DIAGNOSIS — Z1211 Encounter for screening for malignant neoplasm of colon: Secondary | ICD-10-CM

## 2018-07-14 DIAGNOSIS — N400 Enlarged prostate without lower urinary tract symptoms: Secondary | ICD-10-CM

## 2018-07-14 DIAGNOSIS — E782 Mixed hyperlipidemia: Secondary | ICD-10-CM

## 2018-07-14 DIAGNOSIS — M4802 Spinal stenosis, cervical region: Secondary | ICD-10-CM | POA: Diagnosis not present

## 2018-07-14 DIAGNOSIS — C8590 Non-Hodgkin lymphoma, unspecified, unspecified site: Secondary | ICD-10-CM

## 2018-07-14 LAB — URINALYSIS, COMPLETE
Bilirubin, UA: NEGATIVE
Glucose, UA: NEGATIVE
Ketones, UA: NEGATIVE
Leukocytes,UA: NEGATIVE
Nitrite, UA: NEGATIVE
Protein,UA: NEGATIVE
Specific Gravity, UA: 1.02 (ref 1.005–1.030)
Urobilinogen, Ur: 0.2 mg/dL (ref 0.2–1.0)
pH, UA: 7 (ref 5.0–7.5)

## 2018-07-14 LAB — MICROSCOPIC EXAMINATION
Bacteria, UA: NONE SEEN
Epithelial Cells (non renal): NONE SEEN /hpf (ref 0–10)
Renal Epithel, UA: NONE SEEN /hpf
WBC, UA: NONE SEEN /hpf (ref 0–5)

## 2018-07-14 NOTE — Progress Notes (Signed)
Virtual Visit Via telephone Note I connected with@ on 07/14/18 by telephone and verified that I am speaking with the correct person or authorized healthcare agent using two identifiers. Brad May is currently located at home and there are no unauthorized people in close proximity. I completed this visit while in a private location in my home .  This visit type was conducted due to national recommendations for restrictions regarding the COVID-19 Pandemic (e.g. social distancing).  This format is felt to be most appropriate for this patient at this time.  All issues noted in this document were discussed and addressed.  No physical exam was performed.    I discussed the limitations, risks, security and privacy concerns of performing an evaluation and management service by telephone and the availability of in person appointments. I also discussed with the patient that there may be a patient responsible charge related to this service. The patient expressed understanding and agreed to proceed.   Date:  07/14/2018    ID:  Brad May      10/21/1940        [id number]   Patient Care Team Patient Care Team: [name], MD as PCP - General (Family Medicine) Pyrtle, [name], MD as Consulting Physician (Gastroenterology) [name], MD as Consulting Physician (Oncology)  Reason for Visit: Primary Care Follow-up     History of Present Illness & Review of Systems:     Brad May is a 78 y.o. year old male primary care patient that presents today for a telehealth visit.  The patient is pleasant and doing well and has just gotten over carpal tunnel surgery and is recovering well from that and still has a follow-up visit scheduled with Dr. Ronnald Ramp but his neck and hand symptoms are improved.  He denies any chest pain pressure tightness or shortness of breath.  He denies any trouble with swallowing heartburn indigestion nausea vomiting diarrhea blood in the stool or black tarry  bowel movements or change in bowel habits.  He did have a colonoscopy with multiple polyps last May 2019.  He was told to get in touch with the gastroenterologist before 2 years because he may need another colonoscopy.  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  He is passing his water well although has to get up at nighttime to go to the bathroom.  He is up-to-date on his eye exams.  He is followed primarily by Dr. Percival Spanish Dr. Ronnald Ramp and Dr. Elmo Putt he is up-to-date on his eye exams.  Review of systems as stated, otherwise negative.  The patient does not have symptoms concerning for COVID-19 infection (fever, chills, cough, or new shortness of breath).      Current Medications (Verified) Allergies as of 07/14/2018      Reactions   Sulfonamide Derivatives    REACTION: unknown      Medication List       Accurate as of July 14, 2018  7:32 AM. If you have any questions, ask your nurse or doctor.        aspirin 81 MG tablet Take 81 mg by mouth daily.   atorvastatin 80 MG tablet Commonly known as: LIPITOR TAKE 1 TABLET BY MOUTH EVERY DAY   b complex vitamins tablet Take 1 tablet by mouth daily.   fish oil-omega-3 fatty acids 1000 MG capsule Take 1 g by mouth daily.   levothyroxine 88 MCG tablet Commonly known as: SYNTHROID Take 1 tablet (  88 mcg total) by mouth daily.   niacin 500 MG CR tablet Commonly known as: NIASPAN TAKE 1 TABLET (500 MG TOTAL) BY MOUTH AT BEDTIME.   PROBIOTIC-10 PO Take 1 capsule by mouth daily.   sildenafil 20 MG tablet Commonly known as: REVATIO TAKE 2-5 TABLETS BY MOUTH AS NEEDED FOR SEXUAL ACTIVITY   Vitamin D-1000 Max St 25 MCG (1000 UT) tablet Generic drug: Cholecalciferol Take 2,000 Units by mouth daily.           Allergies (Verified)    Sulfonamide derivatives  Past Medical History Past Medical History:  Diagnosis Date   BPH (benign prostatic hyperplasia)    Duodenitis    Hyperlipidemia    Non Hodgkin's lymphoma ([place])  33/29/5188   Periumbilical hernia    Thyroid disease      Past Surgical History:  Procedure Laterality Date   APPENDECTOMY     COLONOSCOPY     COLONOSCOPY W/ POLYPECTOMY     INGUINAL HERNIA REPAIR  2006   left   POLYPECTOMY      Social History   Socioeconomic History   Marital status: Married    Spouse name: Not on file   Number of children: 2   Years of education: Not on file   Highest education level: Doctorate  Occupational History   Occupation: High school Principal  Social Needs   Financial resource strain: Not hard at all   Food insecurity    Worry: Never true    Inability: Never true   Transportation needs    Medical: No    Non-medical: No  Tobacco Use   Smoking status: Never Smoker   Smokeless tobacco: Never Used  Substance and Sexual Activity   Alcohol use: Yes    Comment: 1-2 month   Drug use: No   Sexual activity: Yes  Lifestyle   Physical activity    Days per week: 4 days    Minutes per session: 30 min   Stress: Not at all  Relationships   Social connections    Talks on phone: More than three times a week    Gets together: More than three times a week    Attends religious service: More than 4 times per year    Active member of club or organization: Yes    Attends meetings of clubs or organizations: More than 4 times per year    Relationship status: Married  Other Topics Concern   Not on file  Social History Narrative   Lives at home with wife.  Two children.  Daughter died suicide.  Two grands.  Retired Tourist information centre manager.       Family History  Problem Relation Age of Onset   Heart disease Mother    Heart failure Mother        age 73   Heart disease Father    Heart attack Father 49   COPD Brother    Suicidality Daughter    Dementia Paternal Grandfather    Colon cancer Neg Hx    Stomach cancer Neg Hx    Esophageal cancer Neg Hx    Liver cancer Neg Hx    Pancreatic cancer Neg Hx    Prostate cancer Neg Hx      Rectal cancer Neg Hx    Ulcerative colitis Neg Hx       Labs/Other Tests and Data Reviewed:    Wt Readings from Last 3 Encounters:  06/30/18 176 lb (79.8 kg)  01/13/18 176 lb (79.8 kg)  11/18/17  172 lb 1.6 oz (78.1 kg)   Temp Readings from Last 3 Encounters:  01/13/18 (!) 97.1 F (36.2 C) (Oral)  11/18/17 (!) 97.5 F (36.4 C)  07/11/17 (!) 97 F (36.1 C) (Oral)   BP Readings from Last 3 Encounters:  01/13/18 117/73  11/18/17 133/76  07/11/17 125/83   Pulse Readings from Last 3 Encounters:  01/13/18 72  11/18/17 70  07/11/17 70     Lab Results  Component Value Date   HGBA1C 5.6 10/21/2014   Lab Results  Component Value Date   LDLCALC 77 01/13/2018   CREATININE 0.90 01/13/2018       Chemistry      Component Value Date/Time   NA 141 01/13/2018 0900   K 4.4 01/13/2018 0900   CL 107 (H) 01/13/2018 0900   CO2 17 (L) 01/13/2018 0900   BUN 12 01/13/2018 0900   CREATININE 0.90 01/13/2018 0900      Component Value Date/Time   CALCIUM 9.2 01/13/2018 0900   ALKPHOS 103 01/13/2018 0900   AST 22 01/13/2018 0900   ALT 24 01/13/2018 0900   BILITOT 0.6 01/13/2018 0900         OBSERVATIONS/ OBJECTIVE:     Patient is pleasant and alert and his weight is staying stable at about 168 pounds.  His blood pressure typically runs less than 130 and 135 over the 70s.  He has no fever.  He is up-to-date on his eye exams.  He sees a hematologist regularly and will follow-up regularly with the gastroenterologist, Dr. Elmo Putt and Dr. Percival Spanish is on an as needed basis.  Physical exam deferred due to nature of telephonic visit.  ASSESSMENT & PLAN    Time:   Today, I have spent 22 minutes with the patient via telephone discussing the above including Covid precautions.     Visit Diagnoses: 1. Erectile dysfunction due to arterial insufficiency -Continue to take generic Viagra periodically.  2. Colitis -Continue to follow-up with Dr. Lenna Sciara Pyrtle  3. Hypothyroidism due to  acquired atrophy of thyroid -Check thyroid today and continue current dosage pending results of lab work  4. Benign prostatic hyperplasia, unspecified whether lower urinary tract symptoms present -Patient does have nocturia x1.  Will check urinalysis today.  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 Benay Spice as planned his hematologist  6. Mixed hyperlipidemia -Continue atorvastatin and aggressive therapeutic lifestyle changes pending results of lab work  7. Vitamin D deficiency -Continue with vitamin D replacement pending results of lab work  8. Foraminal stenosis of cervical region -The patient does have some degenerative changes of his cervical spine but apparently most of his symptoms were secondary to carpal tunnel syndrome which is greatly improved since his carpal tunnel surgery and he is still following up with his neurosurgeon Dr. Sherley Bounds.  9. DDD (degenerative disc disease), cervical -Patient is doing better with this.  Patient Instructions  Continue to practice good hand and respiratory hygiene Continue to follow-up as directed by the gastroenterologist, Dr. Billie Lade Continue to follow-up with hematologist, Dr. Benay Spice Continue to follow-up if needed with cardiologist, Dr. Percival Spanish Get rectal exam and check PSA in December by Dr. Livia Snellen Continue to stay active physically     The above assessment and management plan was discussed with the patient. The patient verbalized understanding of and has agreed to the management plan. Patient is aware to call the clinic if symptoms persist or worsen. Patient is aware when to return to the clinic for a follow-up visit.  Patient educated on when it is appropriate to go to the emergency department.    [name], MD [facility] [address], [place], [place] [address] Ph [phone]   Arrie Senate MD

## 2018-07-14 NOTE — Addendum Note (Signed)
Addended by: Zannie Cove on: 07/14/2018 11:54 AM   Modules accepted: Orders

## 2018-07-14 NOTE — Addendum Note (Signed)
Addended by: Earlene Plater on: 07/14/2018 11:55 AM   Modules accepted: Orders

## 2018-07-14 NOTE — Patient Instructions (Signed)
Continue to practice good hand and respiratory hygiene Continue to follow-up as directed by the gastroenterologist, Dr. Billie Lade Continue to follow-up with hematologist, Dr. Benay Spice Continue to follow-up if needed with cardiologist, Dr. Percival Spanish Get rectal exam and check PSA in December by Dr. Livia Snellen Continue to stay active physically

## 2018-07-15 ENCOUNTER — Telehealth: Payer: Self-pay | Admitting: Family Medicine

## 2018-07-15 DIAGNOSIS — E875 Hyperkalemia: Secondary | ICD-10-CM

## 2018-07-15 DIAGNOSIS — R311 Benign essential microscopic hematuria: Secondary | ICD-10-CM

## 2018-07-15 DIAGNOSIS — R739 Hyperglycemia, unspecified: Secondary | ICD-10-CM

## 2018-07-15 LAB — CBC WITH DIFFERENTIAL/PLATELET
Basophils Absolute: 0.1 10*3/uL (ref 0.0–0.2)
Basos: 1 %
EOS (ABSOLUTE): 0.2 10*3/uL (ref 0.0–0.4)
Eos: 4 %
Hematocrit: 48.7 % (ref 37.5–51.0)
Hemoglobin: 16.1 g/dL (ref 13.0–17.7)
Immature Grans (Abs): 0 10*3/uL (ref 0.0–0.1)
Immature Granulocytes: 0 %
Lymphocytes Absolute: 2.2 10*3/uL (ref 0.7–3.1)
Lymphs: 37 %
MCH: 31.6 pg (ref 26.6–33.0)
MCHC: 33.1 g/dL (ref 31.5–35.7)
MCV: 96 fL (ref 79–97)
Monocytes Absolute: 0.5 10*3/uL (ref 0.1–0.9)
Monocytes: 9 %
Neutrophils Absolute: 3 10*3/uL (ref 1.4–7.0)
Neutrophils: 49 %
Platelets: 234 10*3/uL (ref 150–450)
RBC: 5.09 x10E6/uL (ref 4.14–5.80)
RDW: 12.4 % (ref 11.6–15.4)
WBC: 6 10*3/uL (ref 3.4–10.8)

## 2018-07-15 LAB — BMP8+EGFR
BUN/Creatinine Ratio: 11 (ref 10–24)
BUN: 11 mg/dL (ref 8–27)
CO2: 24 mmol/L (ref 20–29)
Calcium: 9.8 mg/dL (ref 8.6–10.2)
Chloride: 99 mmol/L (ref 96–106)
Creatinine, Ser: 1 mg/dL (ref 0.76–1.27)
GFR calc Af Amer: 83 mL/min/{1.73_m2} (ref >59)
GFR calc non Af Amer: 72 mL/min/{1.73_m2} (ref >59)
Glucose: 122 mg/dL — ABNORMAL HIGH (ref 65–99)
Potassium: 6 mmol/L (ref 3.5–5.2)
Sodium: 137 mmol/L (ref 134–144)

## 2018-07-15 LAB — LIPID PANEL
Chol/HDL Ratio: 3.7 ratio (ref 0.0–5.0)
Cholesterol, Total: 158 mg/dL (ref 100–199)
HDL: 43 mg/dL (ref >39)
LDL Calculated: 79 mg/dL (ref 0–99)
Triglycerides: 181 mg/dL — ABNORMAL HIGH (ref 0–149)
VLDL Cholesterol Cal: 36 mg/dL (ref 5–40)

## 2018-07-15 LAB — HEPATIC FUNCTION PANEL
ALT: 29 IU/L (ref 0–44)
AST: 31 IU/L (ref 0–40)
Albumin: 4.3 g/dL (ref 3.7–4.7)
Alkaline Phosphatase: 109 IU/L (ref 39–117)
Bilirubin Total: 0.8 mg/dL (ref 0.0–1.2)
Bilirubin, Direct: 0.17 mg/dL (ref 0.00–0.40)
Total Protein: 6.5 g/dL (ref 6.0–8.5)

## 2018-07-15 LAB — VITAMIN D 25 HYDROXY (VIT D DEFICIENCY, FRACTURES): Vit D, 25-Hydroxy: 40.3 ng/mL (ref 30.0–100.0)

## 2018-07-15 NOTE — Telephone Encounter (Signed)
Pt aware of all results and future orders placed for repeat labs.

## 2018-07-25 ENCOUNTER — Other Ambulatory Visit: Payer: Self-pay

## 2018-07-25 ENCOUNTER — Other Ambulatory Visit: Payer: Medicare Other

## 2018-07-25 ENCOUNTER — Other Ambulatory Visit: Payer: Self-pay | Admitting: *Deleted

## 2018-07-25 DIAGNOSIS — R739 Hyperglycemia, unspecified: Secondary | ICD-10-CM

## 2018-07-25 DIAGNOSIS — R311 Benign essential microscopic hematuria: Secondary | ICD-10-CM | POA: Diagnosis not present

## 2018-07-25 DIAGNOSIS — E875 Hyperkalemia: Secondary | ICD-10-CM

## 2018-07-25 DIAGNOSIS — Z1211 Encounter for screening for malignant neoplasm of colon: Secondary | ICD-10-CM

## 2018-07-25 LAB — URINALYSIS, MICROSCOPIC ONLY
Bacteria, UA: NONE SEEN
Epithelial Cells (non renal): NONE SEEN /hpf (ref 0–10)
RBC: NONE SEEN /hpf (ref 0–2)
WBC, UA: NONE SEEN /hpf (ref 0–5)

## 2018-07-25 LAB — BAYER DCA HB A1C WAIVED: HB A1C (BAYER DCA - WAIVED): 5.9 % (ref <7.0)

## 2018-07-26 LAB — BMP8+EGFR
BUN/Creatinine Ratio: 15 (ref 10–24)
BUN: 12 mg/dL (ref 8–27)
CO2: 20 mmol/L (ref 20–29)
Calcium: 9.3 mg/dL (ref 8.6–10.2)
Chloride: 100 mmol/L (ref 96–106)
Creatinine, Ser: 0.8 mg/dL (ref 0.76–1.27)
GFR calc Af Amer: 99 mL/min/{1.73_m2} (ref >59)
GFR calc non Af Amer: 86 mL/min/{1.73_m2} (ref >59)
Glucose: 103 mg/dL — ABNORMAL HIGH (ref 65–99)
Potassium: 4.6 mmol/L (ref 3.5–5.2)
Sodium: 136 mmol/L (ref 134–144)

## 2018-07-26 LAB — FECAL OCCULT BLOOD, IMMUNOCHEMICAL: Fecal Occult Bld: NEGATIVE

## 2018-08-27 ENCOUNTER — Telehealth (HOSPITAL_COMMUNITY): Payer: Self-pay

## 2018-08-27 ENCOUNTER — Telehealth: Payer: Self-pay | Admitting: *Deleted

## 2018-08-27 DIAGNOSIS — Z01812 Encounter for preprocedural laboratory examination: Secondary | ICD-10-CM

## 2018-08-27 NOTE — Telephone Encounter (Signed)
Encounter complete. 

## 2018-08-27 NOTE — Telephone Encounter (Signed)
Patient scheduled for Saturday 8/15 at 12:40 for COVID screening

## 2018-08-27 NOTE — Telephone Encounter (Signed)
-----   Message from Marcine Matar sent at 08/19/2018 11:27 AM EDT ----- Regarding: COVID test Good Day,    There is a GXT scheduled on Wednesday, September 03, 2018, @ 9:45 am a patient of Dr. Percival Spanish name Tedric Leeth.  Please call the patient and schedule/order COVID testing at [facility] the patient is aware the nurse will be calling to set up COVID testing and self-isolate after the COVID testing is performed until her appointment.    If the patient does not have the COVID testing performed and if we don't have the testing results back prior to the stress echo appointment, we will have to cancel the test.   Jari Sportsman

## 2018-08-30 ENCOUNTER — Other Ambulatory Visit (HOSPITAL_COMMUNITY)
Admission: RE | Admit: 2018-08-30 | Discharge: 2018-08-30 | Disposition: A | Discharge status: Home / Self Care | Payer: Medicare Other | Source: Ambulatory Visit | Attending: Cardiology | Admitting: Cardiology

## 2018-08-30 DIAGNOSIS — Z20828 Contact with and (suspected) exposure to other viral communicable diseases: Secondary | ICD-10-CM | POA: Insufficient documentation

## 2018-08-30 DIAGNOSIS — Z01812 Encounter for preprocedural laboratory examination: Secondary | ICD-10-CM | POA: Insufficient documentation

## 2018-08-30 LAB — SARS CORONAVIRUS 2 (TAT 6-24 HRS): SARS Coronavirus 2: NEGATIVE

## 2018-09-03 ENCOUNTER — Other Ambulatory Visit: Payer: Self-pay

## 2018-09-03 ENCOUNTER — Ambulatory Visit (HOSPITAL_COMMUNITY)
Admission: RE | Admit: 2018-09-03 | Discharge: 2018-09-03 | Disposition: A | Discharge status: Home / Self Care | Payer: Medicare Other | Source: Ambulatory Visit | Attending: Cardiovascular Disease | Admitting: Cardiovascular Disease

## 2018-09-03 DIAGNOSIS — E7849 Other hyperlipidemia: Secondary | ICD-10-CM | POA: Diagnosis not present

## 2018-09-03 DIAGNOSIS — Z136 Encounter for screening for cardiovascular disorders: Secondary | ICD-10-CM

## 2018-09-03 LAB — EXERCISE TOLERANCE TEST
Estimated workload: 9.2 METS
Exercise duration (min): 7 min
Exercise duration (sec): 29 s
MPHR: 142 {beats}/min
Peak HR: 148 {beats}/min
Percent HR: 104 %
Rest HR: 64 {beats}/min

## 2018-09-23 ENCOUNTER — Other Ambulatory Visit: Payer: Self-pay | Admitting: Family Medicine

## 2018-10-06 DIAGNOSIS — Z23 Encounter for immunization: Secondary | ICD-10-CM | POA: Diagnosis not present

## 2018-10-30 IMAGING — MR MR CERVICAL SPINE W/O CM
4 of 5 series · 14 of 48 positions shown · non-contrast
Comparison: Radiography 05/16/2017 and 07/05/2015.

CLINICAL DATA: Numbness of the right arm for the last 2-3 months.

EXAM:
MRI CERVICAL SPINE WITHOUT CONTRAST
TECHNIQUE: Multiplanar, multisequence MR imaging of the cervical spine was
performed. No intravenous contrast was administered.

[Series 3: T2 · sagittal · 3.0mm · 0.42mm/px · 5 of 15 slices shown (1 of 2)]
[im 1/15]
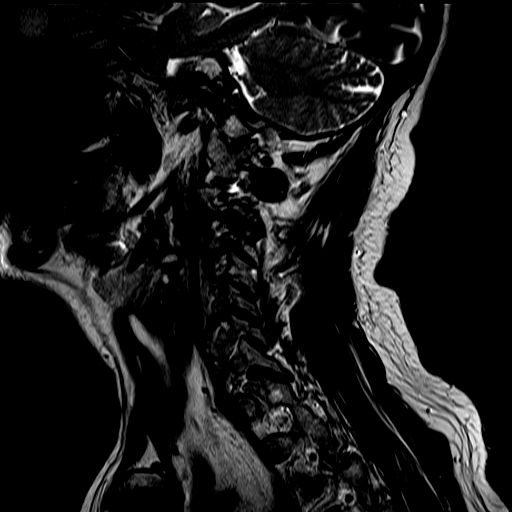
[im 3/15]
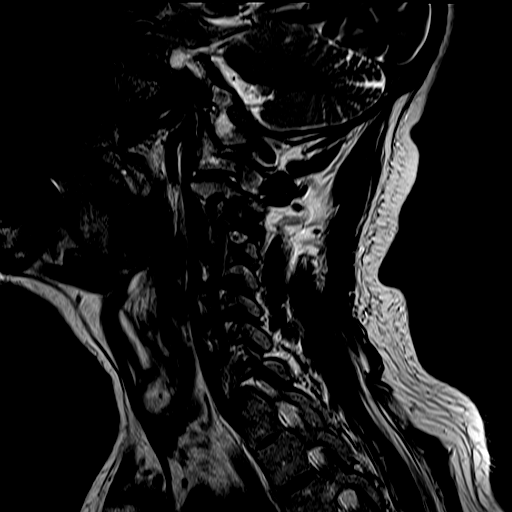
[im 6/15]
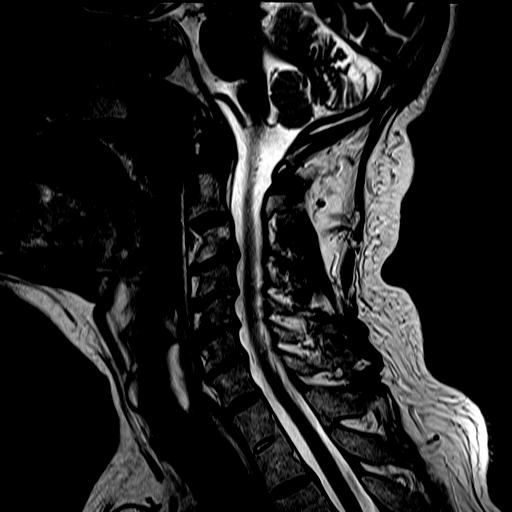
[im 9/15]
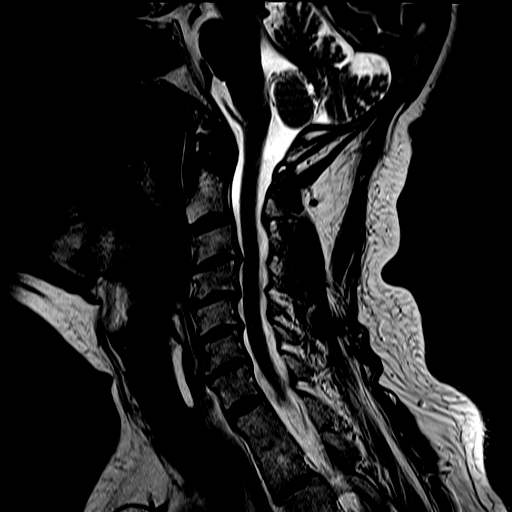
[im 15/15]
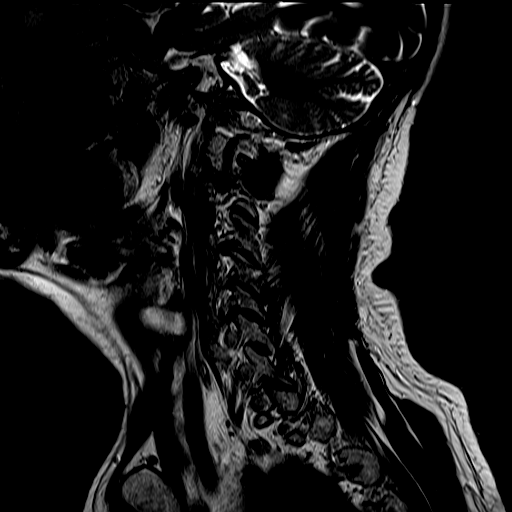

[Series 4: FLAIR · sagittal · 3.0mm · 0.48mm/px · 3 of 15 slices shown]
[im 3/15]
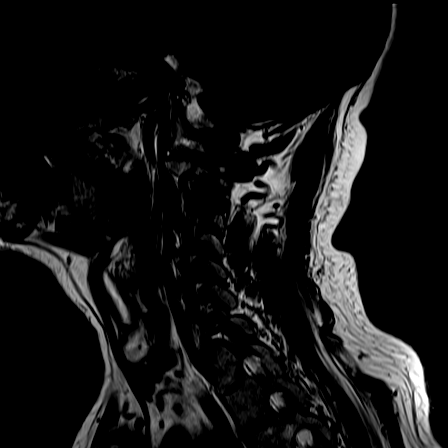
[im 9/15]
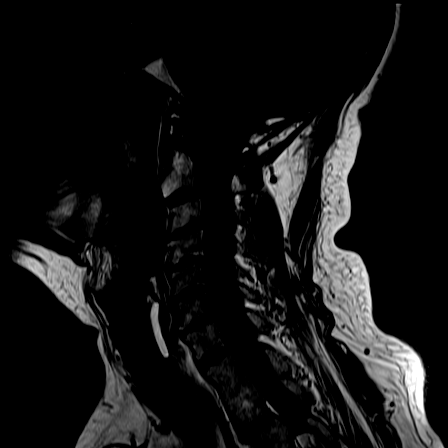
[im 15/15]
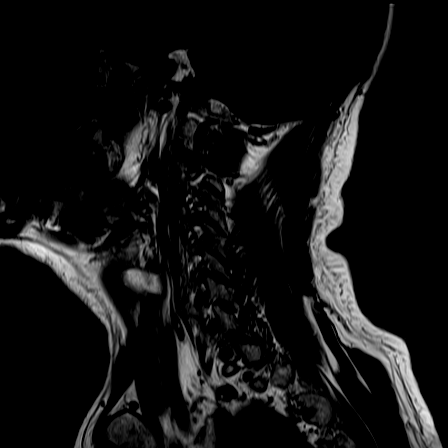

[Series 5: ir sagital · sagittal · 3.0mm · 0.24mm/px · 3 of 15 slices shown]
[im 3/15]
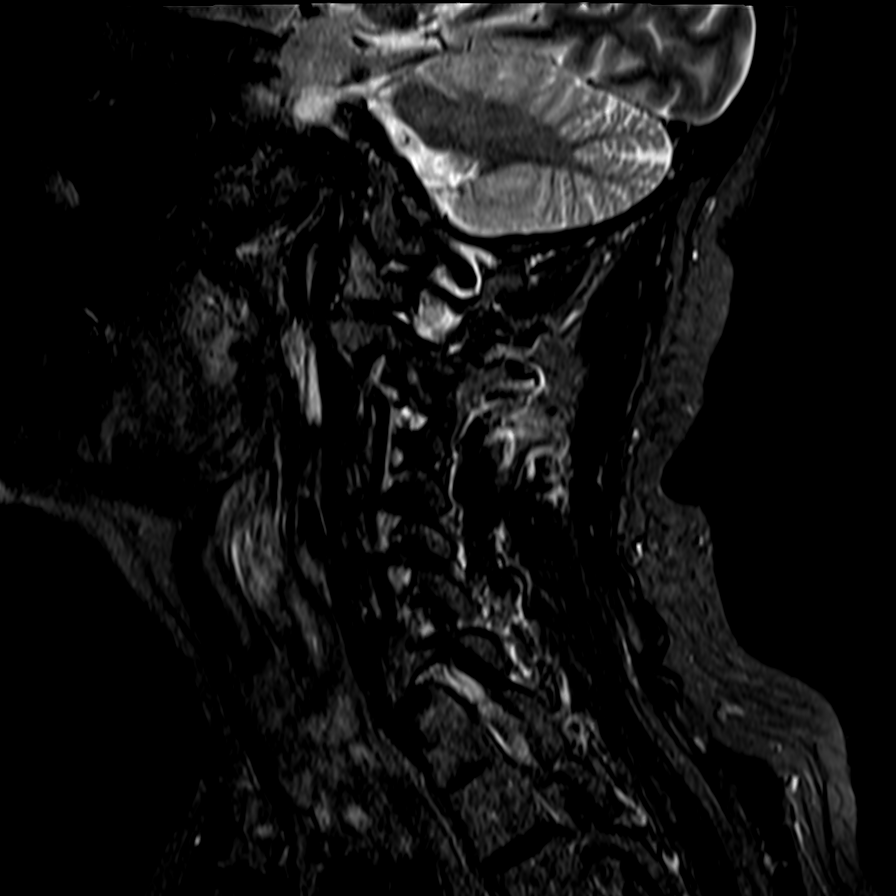
[im 9/15]
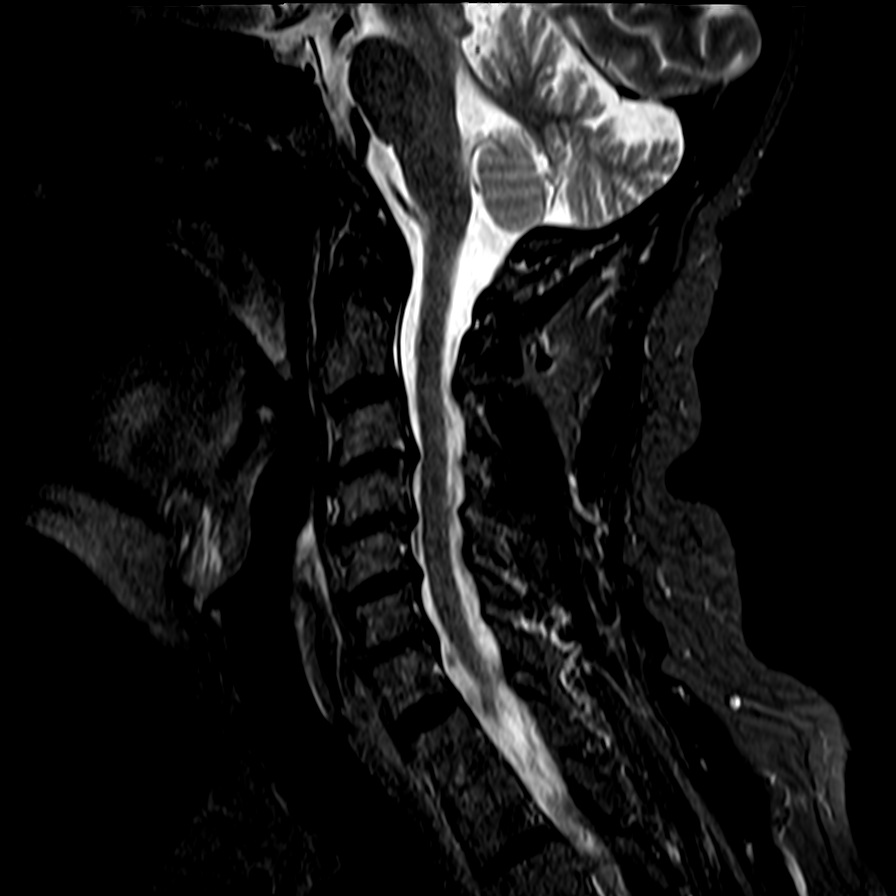
[im 15/15]
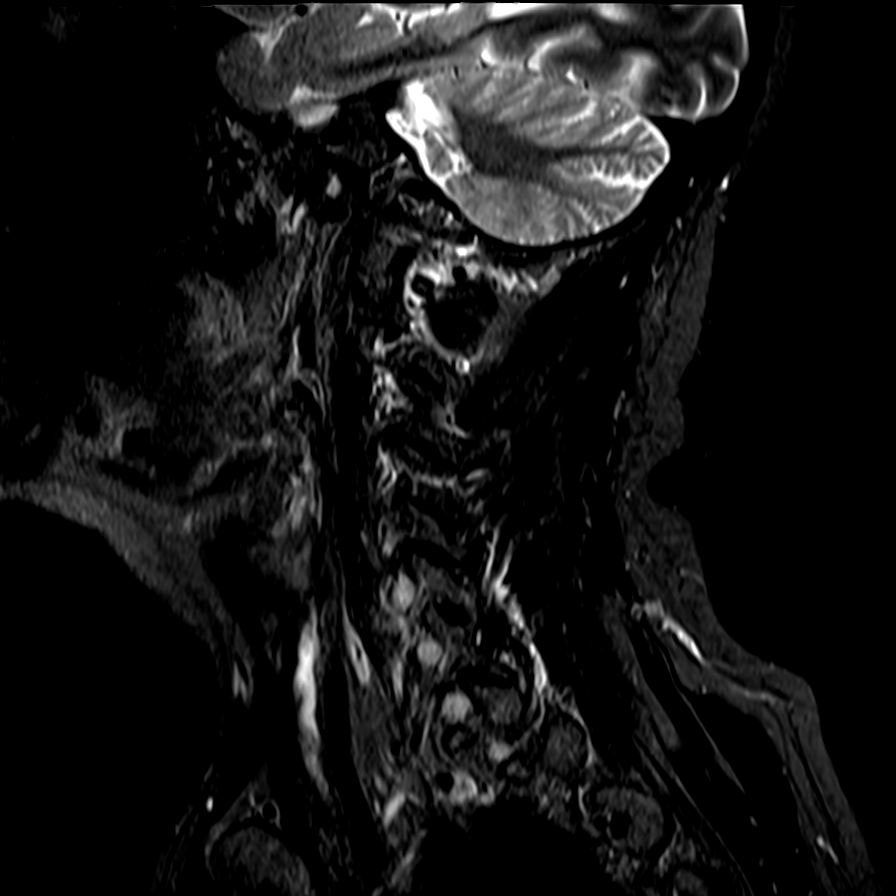

[Series 7: T2 · axial · 3.0mm · 0.20mm/px · z∈[-30,+51]mm · 3 of 36 slices shown (2 of 2)]
[im 6/36]
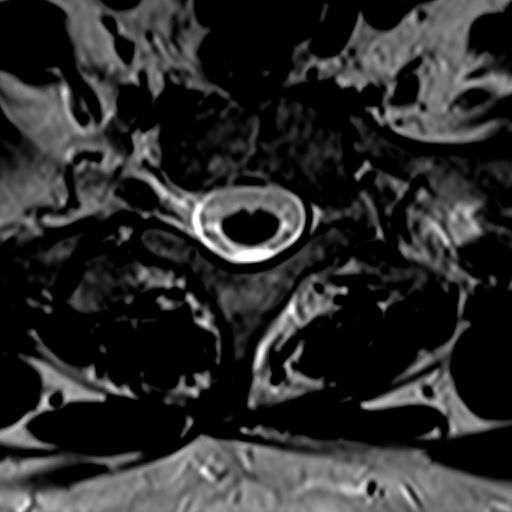
[im 18/36]
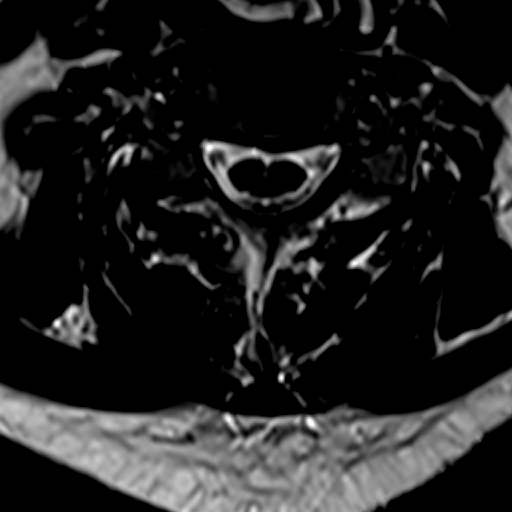
[im 31/36]
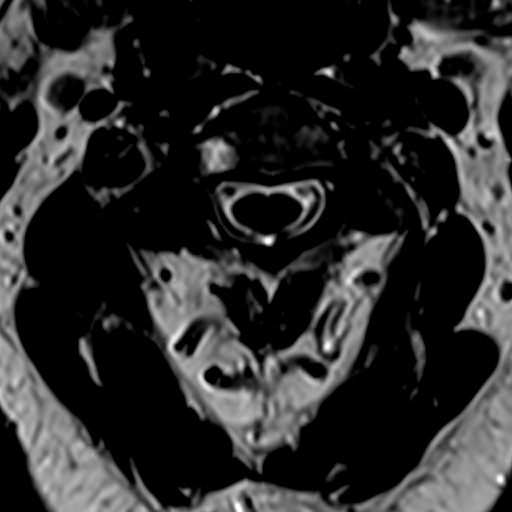

[14 of 48 positions shown; findings below may reference images not displayed]

FINDINGS: Alignment: 2 mm anterolisthesis C2-3. 2 mm retrolisthesis C3-4 and
C4-5.

Vertebrae: No fracture or primary bone lesion. Congenital failure of
separation at T1-2.

Cord: No cord compression or primary cord lesion.

Posterior Fossa, vertebral arteries, paraspinal tissues: Negative

Disc levels:

Foramen magnum is widely patent. Ordinary mild osteoarthritis at the
C1-2 articulation without encroachment upon the neural structures.

C2-3: Facet arthropathy on the right. 2 mm of anterolisthesis. No
disc pathology. Right foraminal narrowing that could affect the
right C3 nerve.

C3-4: 2 mm retrolisthesis. Endplate osteophytes and bulging of the
disc. Mild bilateral facet degeneration and hypertrophy. No
compressive canal stenosis. Foraminal stenosis left worse than
right. Either C4 nerve could be affected, more likely the left.

C4-5: 2 mm retrolisthesis. Endplate osteophytes and bulging of the
disc. No central canal stenosis. Bilateral foraminal stenosis that
could affect either C5 nerve.

C5-6: Endplate osteophytes and mild bulging of the disc. No
compressive canal stenosis. Mild bilateral foraminal narrowing.

C6-7: Mild bulging of the disc.  No canal or foraminal stenosis.

C7-T1: Facet osteoarthritis with 2 mm of anterolisthesis. Minimal
bulging of the disc. No canal or foraminal stenosis.

T1-2: Congenital failure of separation as noted above. No canal or
foraminal stenosis.
IMPRESSION: Cervical facet arthropathy and degenerative spondylosis as detailed
above. Potential for neural compression due to foraminal stenosis
particularly on the right at C2-3, bilateral at C3-4, and bilateral
at C4-5.

## 2018-11-10 DIAGNOSIS — L308 Other specified dermatitis: Secondary | ICD-10-CM | POA: Diagnosis not present

## 2018-11-10 DIAGNOSIS — L578 Other skin changes due to chronic exposure to nonionizing radiation: Secondary | ICD-10-CM | POA: Diagnosis not present

## 2018-11-10 DIAGNOSIS — D485 Neoplasm of uncertain behavior of skin: Secondary | ICD-10-CM | POA: Diagnosis not present

## 2018-11-10 DIAGNOSIS — Z85828 Personal history of other malignant neoplasm of skin: Secondary | ICD-10-CM | POA: Diagnosis not present

## 2018-11-10 DIAGNOSIS — L905 Scar conditions and fibrosis of skin: Secondary | ICD-10-CM | POA: Diagnosis not present

## 2018-11-10 DIAGNOSIS — L821 Other seborrheic keratosis: Secondary | ICD-10-CM | POA: Diagnosis not present

## 2018-11-20 ENCOUNTER — Other Ambulatory Visit: Payer: Self-pay

## 2018-11-20 ENCOUNTER — Inpatient Hospital Stay: Payer: Medicare Other | Attending: Oncology | Admitting: Oncology

## 2018-11-20 VITALS — BP 132/75 | HR 81 | Temp 98.5°F | Resp 18 | Ht 68.0 in | Wt 173.8 lb

## 2018-11-20 DIAGNOSIS — Z23 Encounter for immunization: Secondary | ICD-10-CM | POA: Diagnosis not present

## 2018-11-20 DIAGNOSIS — C884 Extranodal marginal zone B-cell lymphoma of mucosa-associated lymphoid tissue [MALT-lymphoma]: Secondary | ICD-10-CM

## 2018-11-20 MED ORDER — PNEUMOCOCCAL VAC POLYVALENT 25 MCG/0.5ML IJ INJ
0.5000 mL | INJECTION | Freq: Once | INTRAMUSCULAR | Status: AC
  Administered 2018-11-20: 13:00:00 0.5 mL via INTRAMUSCULAR
  Filled 2018-11-20: qty 0.5

## 2018-11-20 NOTE — Progress Notes (Signed)
  [place] OFFICE PROGRESS NOTE   Diagnosis: Marginal zone lymphoma  INTERVAL HISTORY:   Mr. Novosel returns as scheduled.  He feels well.  No fever or night sweats.  Good appetite.  No difficulty with bowel function.  Objective:  Vital signs in last 24 hours:  Blood pressure 132/75, pulse 81, temperature 98.5 F (36.9 C), temperature source Temporal, resp. rate 18, height 5\' 8"  (1.727 m), weight 173 lb 12.8 oz (78.8 kg), SpO2 98 %.    HEENT: Neck without mass Lymphatics: No cervical, supraclavicular, axillary, or inguinal nodes GI: No hepatosplenomegaly, no mass, nontender Vascular: No leg edema   Lab Results:  Lab Results  Component Value Date   WBC 6.0 07/14/2018   HGB 16.1 07/14/2018   HCT 48.7 07/14/2018   MCV 96 07/14/2018   PLT 234 07/14/2018   NEUTROABS 3.0 07/14/2018    CMP  Lab Results  Component Value Date   NA 136 07/25/2018   K 4.6 07/25/2018   CL 100 07/25/2018   CO2 20 07/25/2018   GLUCOSE 103 (H) 07/25/2018   BUN 12 07/25/2018   CREATININE 0.80 07/25/2018   CALCIUM 9.3 07/25/2018   PROT 6.5 07/14/2018   ALBUMIN 4.3 07/14/2018   AST 31 07/14/2018   ALT 29 07/14/2018   ALKPHOS 109 07/14/2018   BILITOT 0.8 07/14/2018   GFRNONAA 86 07/25/2018   GFRAA 99 07/25/2018     Medications: I have reviewed the patient's current medications.   Assessment/Plan: 1. History of intermittent rectal bleeding.  2. 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  1. 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  2. CT of the abdomen and pelvis 08/04/2010 with abdominal/pelvic lymphadenopathy. 3. PET scan 09/13/2010 confirming mildly enlarged abdominal/pelvic lymph nodes with  borderline increased FDG activity. 4. Initiation of single agent rituximab therapy on 11/28/2010, he completed 4 weekly treatments on 12/21/2010 5. 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 6. Cycle 1 bendamustine/rituximab 05/01/2011 7. Cycle 2 bendamustine/rituximab 05/29/2011 8. Cycle 3 bendamustine/rituximab 06/26/2011 9. Restaging PET scan 07/27/2011-no evidence of lymphoma 10. Restaging sigmoidoscopy 10/03/2011-normal colonic mucosa 11. Colonoscopy 05/22/2017- multiple polyps removed, tubular adenomas 3. History of iron deficiency. Ferritin was in the low normal range on 11/14/2010. Hemoglobin remains normal. 4. ? left upper lung nodule on a chest x-ray [id number] hypermetabolic lung nodule was seen on a PET scan 09/13/2010    Disposition: Mr. Kopitzke appears stable.  There is no clinical evidence for progression of the marginal zone lymphoma.  He will return for an office visit in 1 year.  Mr. Lundsten received a pneumococcal vaccine today.  Betsy Coder, MD  11/20/2018  1:27 PM

## 2018-11-25 DIAGNOSIS — H02412 Mechanical ptosis of left eyelid: Secondary | ICD-10-CM | POA: Diagnosis not present

## 2018-11-25 DIAGNOSIS — H524 Presbyopia: Secondary | ICD-10-CM | POA: Diagnosis not present

## 2018-11-25 DIAGNOSIS — H5201 Hypermetropia, right eye: Secondary | ICD-10-CM | POA: Diagnosis not present

## 2018-11-25 DIAGNOSIS — H43813 Vitreous degeneration, bilateral: Secondary | ICD-10-CM | POA: Diagnosis not present

## 2018-11-25 DIAGNOSIS — H5212 Myopia, left eye: Secondary | ICD-10-CM | POA: Diagnosis not present

## 2018-11-25 DIAGNOSIS — H25813 Combined forms of age-related cataract, bilateral: Secondary | ICD-10-CM | POA: Diagnosis not present

## 2018-11-25 DIAGNOSIS — H52223 Regular astigmatism, bilateral: Secondary | ICD-10-CM | POA: Diagnosis not present

## 2019-01-06 ENCOUNTER — Ambulatory Visit (INDEPENDENT_AMBULATORY_CARE_PROVIDER_SITE_OTHER): Payer: Medicare Other | Admitting: Family Medicine

## 2019-01-06 ENCOUNTER — Other Ambulatory Visit: Payer: Self-pay

## 2019-01-06 ENCOUNTER — Encounter: Payer: Self-pay | Admitting: Family Medicine

## 2019-01-06 VITALS — BP 132/88 | HR 69 | Temp 96.6°F | Ht 68.0 in | Wt 176.6 lb

## 2019-01-06 DIAGNOSIS — E782 Mixed hyperlipidemia: Secondary | ICD-10-CM | POA: Diagnosis not present

## 2019-01-06 DIAGNOSIS — E034 Atrophy of thyroid (acquired): Secondary | ICD-10-CM

## 2019-01-06 DIAGNOSIS — E559 Vitamin D deficiency, unspecified: Secondary | ICD-10-CM

## 2019-01-06 DIAGNOSIS — C8590 Non-Hodgkin lymphoma, unspecified, unspecified site: Secondary | ICD-10-CM | POA: Diagnosis not present

## 2019-01-06 MED ORDER — ATORVASTATIN CALCIUM 80 MG PO TABS: 80.0000 mg | ORAL_TABLET | Freq: Every day | ORAL | 3 refills | Status: DC

## 2019-01-06 MED ORDER — LEVOTHYROXINE SODIUM 88 MCG PO TABS: 88.0000 ug | ORAL_TABLET | Freq: Every day | ORAL | 3 refills | Status: DC

## 2019-01-06 MED ORDER — NIACIN ER (ANTIHYPERLIPIDEMIC) 500 MG PO TBCR: 500.0000 mg | EXTENDED_RELEASE_TABLET | Freq: Every day | ORAL | 1 refills | Status: DC

## 2019-01-06 MED ORDER — METRONIDAZOLE 1 % EX GEL: Freq: Every day | CUTANEOUS | 11 refills | Status: DC

## 2019-01-06 NOTE — Progress Notes (Signed)
Subjective:  Patient ID: Brad May, adult    DOB: [birth year]  Age: 78 y.o. MRN: [id number]  CC: Medical Management of Chronic Issues (six month recheck)   HPI DERRYCK LONGER presents for  follow-up on  thyroid. The patient has a history of hypothyroidism for many years. It has been stable recently. Pt. denies any change in  voice, loss of hair, heat or cold intolerance. Energy level has been adequate to good. Patient denies constipation and diarrhea. No myxedema. Medication is as noted below. Verified that pt is taking it daily on an empty stomach. Well tolerated.  in for follow-up of elevated cholesterol. Doing well without complaints on current medication. Denies side effects of statin including myalgia and arthralgia and nausea. Currently no chest pain, shortness of breath or other cardiovascular related symptoms noted.   Depression screen [facility] 2/9 01/06/2019 06/30/2018 01/13/2018  Decreased Interest 0 0 0  Down, Depressed, Hopeless 0 0 0  PHQ - 2 Score 0 0 0    History [place] has a past medical history of BPH (benign prostatic hyperplasia), Duodenitis, Hyperlipidemia, Non Hodgkin's lymphoma ([place]) (Q000111Q), Periumbilical hernia, and Thyroid disease.   He has a past surgical history that includes Appendectomy; Inguinal hernia repair (2006); Colonoscopy w/ polypectomy; Colonoscopy; and Polypectomy.   His family history includes COPD in his brother; Dementia in his paternal grandfather; Heart attack (age of onset: 61) in his father; Heart disease in his father and mother; Heart failure in his mother; Suicidality in his daughter.He reports that he has never smoked. He has never used smokeless tobacco. He reports current alcohol use. He reports that he does not use drugs.    ROS Review of Systems  Constitutional: Negative.   HENT: Negative.   Eyes: Negative for visual disturbance.  Respiratory: Negative for cough and shortness of breath.   Cardiovascular: Negative for  chest pain and leg swelling.  Gastrointestinal: Negative for abdominal pain, diarrhea, nausea and vomiting.  Genitourinary: Negative for difficulty urinating.  Musculoskeletal: Negative for arthralgias and myalgias.  Skin: Negative for rash.  Neurological: Negative for headaches.  Psychiatric/Behavioral: Negative for sleep disturbance.    Objective:  BP 132/88   Pulse 69   Temp (!) 96.6 F (35.9 C) (Temporal)   Ht 5\' 8"  (1.727 m)   Wt 176 lb 9.6 oz (80.1 kg)   BMI 26.85 kg/m   BP Readings from Last 3 Encounters:  01/06/19 132/88  11/20/18 132/75  01/13/18 117/73    Wt Readings from Last 3 Encounters:  01/06/19 176 lb 9.6 oz (80.1 kg)  11/20/18 173 lb 12.8 oz (78.8 kg)  06/30/18 176 lb (79.8 kg)     Physical Exam Constitutional:      General: He is not in acute distress.    Appearance: He is well-developed.  HENT:     Head: Normocephalic and atraumatic.     Right Ear: External ear normal.     Left Ear: External ear normal.     Nose: Nose normal.  Eyes:     Conjunctiva/sclera: Conjunctivae normal.     Pupils: Pupils are equal, round, and reactive to light.  Cardiovascular:     Rate and Rhythm: Normal rate and regular rhythm.     Heart sounds: Normal heart sounds. No murmur.  Pulmonary:     Effort: Pulmonary effort is normal. No respiratory distress.     Breath sounds: Normal breath sounds. No wheezing or rales.  Abdominal:     Palpations: Abdomen is soft.  Tenderness: There is no abdominal tenderness.  Musculoskeletal:        General: Normal range of motion.     Cervical back: Normal range of motion and neck supple.  Skin:    General: Skin is warm and dry.  Neurological:     Mental Status: He is alert and oriented to person, place, and time.     Deep Tendon Reflexes: Reflexes are normal and symmetric.  Psychiatric:        Behavior: Behavior normal.        Thought Content: Thought content normal.        Judgment: Judgment normal.       Assessment  & Plan:   Yohann was seen today for medical management of chronic issues.  Diagnoses and all orders for this visit:  Hypothyroidism due to acquired atrophy of thyroid -     CBC -     CMP -     TSH + free T4  Mixed hyperlipidemia -     CBC -     CMP -     Lipid  Vitamin D deficiency -     CBC -     VITAMIN D  Indolent lymphoma ([facility]) -     CBC -     CMP  Other orders -     atorvastatin (LIPITOR) 80 MG tablet; Take 1 tablet (80 mg total) by mouth daily. -     levothyroxine (SYNTHROID) 88 MCG tablet; Take 1 tablet (88 mcg total) by mouth daily. -     niacin (NIASPAN) 500 MG CR tablet; Take 1 tablet (500 mg total) by mouth at bedtime. -     metroNIDAZOLE (METROGEL) 1 % gel; Apply topically daily. To affected areas of face       I have changed [facility] John A. Lundquist "[name]"'s atorvastatin and levothyroxine. I am also having him start on metroNIDAZOLE. Additionally, I am having him maintain his aspirin, fish oil-omega-3 fatty acids, b complex vitamins, Cholecalciferol, Probiotic Product (PROBIOTIC-10 PO), sildenafil, and niacin.  Allergies as of 01/06/2019      Reactions   Sulfonamide Derivatives    REACTION: unknown      Medication List       Accurate as of January 06, 2019  1:28 PM. If you have any questions, ask your nurse or doctor.        aspirin 81 MG tablet Take 81 mg by mouth daily.   atorvastatin 80 MG tablet Commonly known as: LIPITOR Take 1 tablet (80 mg total) by mouth daily.   b complex vitamins tablet Take 1 tablet by mouth daily.   fish oil-omega-3 fatty acids 1000 MG capsule Take 1 g by mouth daily.   levothyroxine 88 MCG tablet Commonly known as: SYNTHROID Take 1 tablet (88 mcg total) by mouth daily.   metroNIDAZOLE 1 % gel Commonly known as: Metrogel Apply topically daily. To affected areas of face Started by: Claretta Fraise, MD   niacin 500 MG CR tablet Commonly known as: NIASPAN Take 1 tablet (500 mg total) by mouth at bedtime.     PROBIOTIC-10 PO Take 1 capsule by mouth daily.   sildenafil 20 MG tablet Commonly known as: REVATIO TAKE 2-5 TABLETS BY MOUTH AS NEEDED FOR SEXUAL ACTIVITY   Vitamin D-1000 Max St 25 MCG (1000 UT) tablet Generic drug: Cholecalciferol Take 2,000 Units by mouth daily.        Follow-up: Return in about 6 months (around 07/07/2019).  Claretta Fraise, M.D.

## 2019-01-07 LAB — CMP14+EGFR
ALT: 26 IU/L (ref 0–44)
AST: 21 IU/L (ref 0–40)
Albumin/Globulin Ratio: 2 (ref 1.2–2.2)
Albumin: 4.3 g/dL (ref 3.7–4.7)
Alkaline Phosphatase: 116 IU/L (ref 39–117)
BUN/Creatinine Ratio: 17 (ref 10–24)
BUN: 14 mg/dL (ref 8–27)
Bilirubin Total: 0.7 mg/dL (ref 0.0–1.2)
CO2: 22 mmol/L (ref 20–29)
Calcium: 9.4 mg/dL (ref 8.6–10.2)
Chloride: 102 mmol/L (ref 96–106)
Creatinine, Ser: 0.81 mg/dL (ref 0.76–1.27)
GFR calc Af Amer: 98 mL/min/{1.73_m2} (ref >59)
GFR calc non Af Amer: 85 mL/min/{1.73_m2} (ref >59)
Globulin, Total: 2.2 g/dL (ref 1.5–4.5)
Glucose: 121 mg/dL — ABNORMAL HIGH (ref 65–99)
Potassium: 4.5 mmol/L (ref 3.5–5.2)
Sodium: 138 mmol/L (ref 134–144)
Total Protein: 6.5 g/dL (ref 6.0–8.5)

## 2019-01-07 LAB — CBC WITH DIFFERENTIAL/PLATELET
Basophils Absolute: 0 10*3/uL (ref 0.0–0.2)
Basos: 1 %
EOS (ABSOLUTE): 0.3 10*3/uL (ref 0.0–0.4)
Eos: 4 %
Hematocrit: 45.9 % (ref 37.5–51.0)
Hemoglobin: 16.6 g/dL (ref 13.0–17.7)
Immature Grans (Abs): 0 10*3/uL (ref 0.0–0.1)
Immature Granulocytes: 0 %
Lymphocytes Absolute: 2.1 10*3/uL (ref 0.7–3.1)
Lymphs: 34 %
MCH: 33.2 pg — ABNORMAL HIGH (ref 26.6–33.0)
MCHC: 36.2 g/dL — ABNORMAL HIGH (ref 31.5–35.7)
MCV: 92 fL (ref 79–97)
Monocytes Absolute: 0.7 10*3/uL (ref 0.1–0.9)
Monocytes: 11 %
Neutrophils Absolute: 3 10*3/uL (ref 1.4–7.0)
Neutrophils: 50 %
Platelets: 204 10*3/uL (ref 150–450)
RBC: 5 x10E6/uL (ref 4.14–5.80)
RDW: 12.5 % (ref 11.6–15.4)
WBC: 6.1 10*3/uL (ref 3.4–10.8)

## 2019-01-07 LAB — LIPID PANEL
Chol/HDL Ratio: 4 ratio (ref 0.0–5.0)
Cholesterol, Total: 147 mg/dL (ref 100–199)
HDL: 37 mg/dL — ABNORMAL LOW (ref >39)
LDL Chol Calc (NIH): 88 mg/dL (ref 0–99)
Triglycerides: 122 mg/dL (ref 0–149)
VLDL Cholesterol Cal: 22 mg/dL (ref 5–40)

## 2019-01-07 LAB — VITAMIN D 25 HYDROXY (VIT D DEFICIENCY, FRACTURES): Vit D, 25-Hydroxy: 31.3 ng/mL (ref 30.0–100.0)

## 2019-01-07 LAB — TSH+FREE T4
Free T4: 1.51 ng/dL (ref 0.82–1.77)
TSH: 3.98 u[IU]/mL (ref 0.450–4.500)

## 2019-01-07 NOTE — Progress Notes (Signed)
Hello [place],  Your lab result is normal and/or stable.Some minor variations that are not significant are commonly marked abnormal, but do not represent any medical problem for you.  Best regards, Yan Okray, M.D.

## 2019-05-07 DIAGNOSIS — R030 Elevated blood-pressure reading, without diagnosis of hypertension: Secondary | ICD-10-CM | POA: Insufficient documentation

## 2019-05-21 DIAGNOSIS — G5622 Lesion of ulnar nerve, left upper limb: Secondary | ICD-10-CM | POA: Insufficient documentation

## 2019-07-07 ENCOUNTER — Ambulatory Visit (INDEPENDENT_AMBULATORY_CARE_PROVIDER_SITE_OTHER): Payer: Medicare Other | Admitting: Family Medicine

## 2019-07-07 ENCOUNTER — Encounter: Payer: Self-pay | Admitting: Family Medicine

## 2019-07-07 ENCOUNTER — Other Ambulatory Visit: Payer: Self-pay

## 2019-07-07 VITALS — BP 126/83 | HR 74 | Temp 97.6°F | Resp 20 | Ht 68.0 in | Wt 172.5 lb

## 2019-07-07 DIAGNOSIS — E034 Atrophy of thyroid (acquired): Secondary | ICD-10-CM | POA: Diagnosis not present

## 2019-07-07 DIAGNOSIS — E782 Mixed hyperlipidemia: Secondary | ICD-10-CM | POA: Diagnosis not present

## 2019-07-07 DIAGNOSIS — E559 Vitamin D deficiency, unspecified: Secondary | ICD-10-CM

## 2019-07-07 DIAGNOSIS — C8590 Non-Hodgkin lymphoma, unspecified, unspecified site: Secondary | ICD-10-CM

## 2019-07-07 DIAGNOSIS — N400 Enlarged prostate without lower urinary tract symptoms: Secondary | ICD-10-CM | POA: Diagnosis not present

## 2019-07-07 MED ORDER — SILDENAFIL CITRATE 20 MG PO TABS: ORAL_TABLET | ORAL | 5 refills | Status: DC

## 2019-07-07 NOTE — Progress Notes (Signed)
Subjective:  Patient ID: Brad May, adult    DOB: March 18, 1940  Age: 79 y.o. MRN: [id number]  CC: Medical Management of Chronic Issues   HPI Brad May presents for  follow-up on  thyroid. The patient has a history of hypothyroidism for many years. It has been stable recently. Pt. denies any change in  voice, loss of hair, heat or cold intolerance. Energy level has been adequate to good. Patient denies constipation and diarrhea. No myxedema. Medication is as noted below. Verified that pt is taking it daily on an empty stomach. Well tolerated.  Patient states he has had some tingling and numbness in the left hand and his scheduled to have carpal tunnel release in the near future.  He has a history of carpal tunnel with repair on the right about 4 years ago.  Depression screen [facility] 2/9 07/07/2019 01/06/2019 06/30/2018  Decreased Interest 0 0 0  Down, Depressed, Hopeless 0 0 0  PHQ - 2 Score 0 0 0    History Brad May has a past medical history of BPH (benign prostatic hyperplasia), Duodenitis, Hyperlipidemia, Non Hodgkin's lymphoma ([place]) (00/17/4944), Periumbilical hernia, and Thyroid disease.   He has a past surgical history that includes Appendectomy; Inguinal hernia repair (2006); Colonoscopy w/ polypectomy; Colonoscopy; and Polypectomy.   His family history includes COPD in his brother; Dementia in his paternal grandfather; Heart attack (age of onset: 76) in his father; Heart disease in his father and mother; Heart failure in his mother; Suicidality in his daughter.He reports that he has never smoked. He has never used smokeless tobacco. He reports current alcohol use. He reports that he does not use drugs.    ROS Review of Systems  Constitutional: Negative for fever.  Respiratory: Negative for shortness of breath.   Cardiovascular: Negative for chest pain.  Musculoskeletal: Negative for arthralgias.  Skin: Negative for rash.    Objective:  BP 126/83   Pulse 74   Temp  97.6 F (36.4 C) (Temporal)   Resp 20   Ht '5\' 8"'  (1.727 m)   Wt 172 lb 8 oz (78.2 kg)   SpO2 97%   BMI 26.23 kg/m   BP Readings from Last 3 Encounters:  07/07/19 126/83  01/06/19 132/88  11/20/18 132/75    Wt Readings from Last 3 Encounters:  07/07/19 172 lb 8 oz (78.2 kg)  01/06/19 176 lb 9.6 oz (80.1 kg)  11/20/18 173 lb 12.8 oz (78.8 kg)     Physical Exam Vitals reviewed.  Constitutional:      Appearance: He is well-developed.  HENT:     Head: Normocephalic and atraumatic.     Right Ear: Tympanic membrane and external ear normal. No decreased hearing noted.     Left Ear: Tympanic membrane and external ear normal. No decreased hearing noted.     Mouth/Throat:     Pharynx: No oropharyngeal exudate or posterior oropharyngeal erythema.  Eyes:     Pupils: Pupils are equal, round, and reactive to light.  Cardiovascular:     Rate and Rhythm: Normal rate and regular rhythm.     Heart sounds: No murmur heard.   Pulmonary:     Effort: No respiratory distress.     Breath sounds: Normal breath sounds.  Abdominal:     General: Bowel sounds are normal.     Palpations: Abdomen is soft. There is no mass.     Tenderness: There is no abdominal tenderness.  Musculoskeletal:     Cervical back: Normal range  of motion and neck supple.       Assessment & Plan:   Brad May was seen today for medical management of chronic issues.  Diagnoses and all orders for this visit:  Hypothyroidism due to acquired atrophy of thyroid -     CBC with Differential/Platelet -     CMP14+EGFR -     TSH + free T4 -     Lipid panel  Mixed hyperlipidemia -     Lipid panel  Vitamin D deficiency -     VITAMIN D 25 Hydroxy (Vit-D Deficiency, Fractures)  Benign prostatic hyperplasia, unspecified whether lower urinary tract symptoms present  Indolent lymphoma (HCC) -     CBC with Differential/Platelet  Other orders -     sildenafil (REVATIO) 20 MG tablet; TAKE 2-5 TABLETS BY MOUTH AS NEEDED  FOR SEXUAL ACTIVITY       I am having Brad John A. Shell "[name]" maintain his aspirin, fish oil-omega-3 fatty acids, b complex vitamins, Cholecalciferol, Probiotic Product (PROBIOTIC-10 PO), atorvastatin, levothyroxine, niacin, metroNIDAZOLE, and sildenafil.  Allergies as of 07/07/2019      Reactions   Sulfonamide Derivatives    REACTION: unknown      Medication List       Accurate as of July 07, 2019  8:55 PM. If you have any questions, ask your nurse or doctor.        aspirin 81 MG tablet Take 81 mg by mouth daily.   atorvastatin 80 MG tablet Commonly known as: LIPITOR Take 1 tablet (80 mg total) by mouth daily.   b complex vitamins tablet Take 1 tablet by mouth daily.   fish oil-omega-3 fatty acids 1000 MG capsule Take 1 g by mouth daily.   levothyroxine 88 MCG tablet Commonly known as: SYNTHROID Take 1 tablet (88 mcg total) by mouth daily.   metroNIDAZOLE 1 % gel Commonly known as: Metrogel Apply topically daily. To affected areas of face   niacin 500 MG CR tablet Commonly known as: NIASPAN Take 1 tablet (500 mg total) by mouth at bedtime.   PROBIOTIC-10 PO Take 1 capsule by mouth daily.   sildenafil 20 MG tablet Commonly known as: REVATIO TAKE 2-5 TABLETS BY MOUTH AS NEEDED FOR SEXUAL ACTIVITY   Vitamin D-1000 Max St 25 MCG (1000 UT) tablet Generic drug: Cholecalciferol Take 2,000 Units by mouth daily.        Follow-up: No follow-ups on file.  Claretta Fraise, M.D.

## 2019-07-08 LAB — CMP14+EGFR
ALT: 15 IU/L (ref 0–44)
AST: 16 IU/L (ref 0–40)
Albumin/Globulin Ratio: 1.9 (ref 1.2–2.2)
Albumin: 4.2 g/dL (ref 3.7–4.7)
Alkaline Phosphatase: 121 IU/L (ref 48–121)
BUN/Creatinine Ratio: 16 (ref 10–24)
BUN: 13 mg/dL (ref 8–27)
Bilirubin Total: 0.6 mg/dL (ref 0.0–1.2)
CO2: 21 mmol/L (ref 20–29)
Calcium: 9.2 mg/dL (ref 8.6–10.2)
Chloride: 104 mmol/L (ref 96–106)
Creatinine, Ser: 0.8 mg/dL (ref 0.76–1.27)
GFR calc Af Amer: 98 mL/min/{1.73_m2} (ref >59)
GFR calc non Af Amer: 85 mL/min/{1.73_m2} (ref >59)
Globulin, Total: 2.2 g/dL (ref 1.5–4.5)
Glucose: 109 mg/dL — ABNORMAL HIGH (ref 65–99)
Potassium: 4.3 mmol/L (ref 3.5–5.2)
Sodium: 139 mmol/L (ref 134–144)
Total Protein: 6.4 g/dL (ref 6.0–8.5)

## 2019-07-08 LAB — CBC WITH DIFFERENTIAL/PLATELET
Basophils Absolute: 0.1 10*3/uL (ref 0.0–0.2)
Basos: 1 %
EOS (ABSOLUTE): 0.3 10*3/uL (ref 0.0–0.4)
Eos: 4 %
Hematocrit: 44.8 % (ref 37.5–51.0)
Hemoglobin: 15.2 g/dL (ref 13.0–17.7)
Immature Grans (Abs): 0 10*3/uL (ref 0.0–0.1)
Immature Granulocytes: 0 %
Lymphocytes Absolute: 1.9 10*3/uL (ref 0.7–3.1)
Lymphs: 25 %
MCH: 31.5 pg (ref 26.6–33.0)
MCHC: 33.9 g/dL (ref 31.5–35.7)
MCV: 93 fL (ref 79–97)
Monocytes Absolute: 0.7 10*3/uL (ref 0.1–0.9)
Monocytes: 9 %
Neutrophils Absolute: 4.5 10*3/uL (ref 1.4–7.0)
Neutrophils: 61 %
Platelets: 203 10*3/uL (ref 150–450)
RBC: 4.83 x10E6/uL (ref 4.14–5.80)
RDW: 12.3 % (ref 11.6–15.4)
WBC: 7.4 10*3/uL (ref 3.4–10.8)

## 2019-07-08 LAB — LIPID PANEL
Chol/HDL Ratio: 3.7 ratio (ref 0.0–5.0)
Cholesterol, Total: 134 mg/dL (ref 100–199)
HDL: 36 mg/dL — ABNORMAL LOW (ref >39)
LDL Chol Calc (NIH): 80 mg/dL (ref 0–99)
Triglycerides: 96 mg/dL (ref 0–149)
VLDL Cholesterol Cal: 18 mg/dL (ref 5–40)

## 2019-07-08 LAB — TSH+FREE T4
Free T4: 1.18 ng/dL (ref 0.82–1.77)
TSH: 3.19 u[IU]/mL (ref 0.450–4.500)

## 2019-07-08 LAB — VITAMIN D 25 HYDROXY (VIT D DEFICIENCY, FRACTURES): Vit D, 25-Hydroxy: 37.4 ng/mL (ref 30.0–100.0)

## 2019-07-08 NOTE — Progress Notes (Signed)
Hello Adelaido,  Your lab result is normal and/or stable.Some minor variations that are not significant are commonly marked abnormal, but do not represent any medical problem for you.  Best regards, Redina Zeller, M.D.

## 2019-08-24 ENCOUNTER — Other Ambulatory Visit: Payer: Self-pay | Admitting: Family Medicine

## 2019-08-24 ENCOUNTER — Telehealth: Payer: Self-pay | Admitting: Family Medicine

## 2019-08-24 DIAGNOSIS — M2550 Pain in unspecified joint: Secondary | ICD-10-CM

## 2019-08-24 NOTE — Telephone Encounter (Signed)
Pt aware.

## 2019-08-24 NOTE — Telephone Encounter (Signed)
Patient notified and verbalized understanding. 

## 2019-08-24 NOTE — Telephone Encounter (Signed)
Pt states he is having joint pain in multiple joints on both sides of the body and he is unsure if it is maybe from a tick or if it is arthritis related and he wanted to know if he could have some labs drawn so he would have the results at his visit and with him to take to his neurology appt after his appt here.

## 2019-08-24 NOTE — Telephone Encounter (Signed)
I ordered [place], arthritis panel and sed rate.

## 2019-08-25 ENCOUNTER — Other Ambulatory Visit: Payer: Self-pay

## 2019-08-25 ENCOUNTER — Other Ambulatory Visit: Payer: Medicare Other

## 2019-08-25 DIAGNOSIS — M2550 Pain in unspecified joint: Secondary | ICD-10-CM

## 2019-08-26 LAB — ARTHRITIS PANEL
Basophils Absolute: 0.1 10*3/uL (ref 0.0–0.2)
Basos: 1 %
EOS (ABSOLUTE): 0.3 10*3/uL (ref 0.0–0.4)
Eos: 4 %
Hematocrit: 44.6 % (ref 37.5–51.0)
Hemoglobin: 15.5 g/dL (ref 13.0–17.7)
Immature Grans (Abs): 0 10*3/uL (ref 0.0–0.1)
Immature Granulocytes: 0 %
Lymphocytes Absolute: 1.8 10*3/uL (ref 0.7–3.1)
Lymphs: 24 %
MCH: 32 pg (ref 26.6–33.0)
MCHC: 34.8 g/dL (ref 31.5–35.7)
MCV: 92 fL (ref 79–97)
Monocytes Absolute: 0.7 10*3/uL (ref 0.1–0.9)
Monocytes: 10 %
Neutrophils Absolute: 4.5 10*3/uL (ref 1.4–7.0)
Neutrophils: 61 %
Platelets: 228 10*3/uL (ref 150–450)
RBC: 4.85 x10E6/uL (ref 4.14–5.80)
RDW: 11.6 % (ref 11.6–15.4)
Rheumatoid fact SerPl-aCnc: <10 IU/mL (ref 0.0–13.9)
Sed Rate: 18 mm/hr (ref 0–30)
Uric Acid: 5.8 mg/dL (ref 3.8–8.4)
WBC: 7.4 10*3/uL (ref 3.4–10.8)

## 2019-08-26 LAB — LYME AB/WESTERN BLOT REFLEX
LYME DISEASE AB, QUANT, IGM: <0.8 index (ref 0.00–0.79)
Lyme IgG/IgM Ab: <0.91 {ISR} (ref 0.00–0.90)

## 2019-08-26 NOTE — Progress Notes (Signed)
Hello Brad May,  Your lab result is normal and/or stable.Some minor variations that are not significant are commonly marked abnormal, but do not represent any medical problem for you.  Best regards, Raeonna Milo, M.D.

## 2019-08-28 NOTE — Progress Notes (Signed)
Hello Syon,  Your lab result is normal and/or stable.Some minor variations that are not significant are commonly marked abnormal, but do not represent any medical problem for you.  Best regards, Sabre Romberger, M.D.

## 2019-09-01 ENCOUNTER — Other Ambulatory Visit: Payer: Self-pay

## 2019-09-01 ENCOUNTER — Encounter: Payer: Self-pay | Admitting: Family Medicine

## 2019-09-01 ENCOUNTER — Ambulatory Visit (INDEPENDENT_AMBULATORY_CARE_PROVIDER_SITE_OTHER): Payer: Medicare Other | Admitting: Family Medicine

## 2019-09-01 VITALS — BP 153/92 | HR 83 | Temp 97.2°F | Resp 20 | Ht 68.0 in | Wt 174.2 lb

## 2019-09-01 DIAGNOSIS — E782 Mixed hyperlipidemia: Secondary | ICD-10-CM | POA: Diagnosis not present

## 2019-09-01 DIAGNOSIS — C8590 Non-Hodgkin lymphoma, unspecified, unspecified site: Secondary | ICD-10-CM | POA: Diagnosis not present

## 2019-09-01 DIAGNOSIS — M8949 Other hypertrophic osteoarthropathy, multiple sites: Secondary | ICD-10-CM

## 2019-09-01 DIAGNOSIS — M159 Polyosteoarthritis, unspecified: Secondary | ICD-10-CM

## 2019-09-01 DIAGNOSIS — E034 Atrophy of thyroid (acquired): Secondary | ICD-10-CM | POA: Diagnosis not present

## 2019-09-01 MED ORDER — BETAMETHASONE SOD PHOS & ACET 6 (3-3) MG/ML IJ SUSP
6.0000 mg | Freq: Once | INTRAMUSCULAR | Status: AC
  Administered 2019-09-01: 6 mg via INTRAMUSCULAR

## 2019-09-01 MED ORDER — NABUMETONE 500 MG PO TABS: 1000.0000 mg | ORAL_TABLET | Freq: Two times a day (BID) | ORAL | 1 refills | Status: DC

## 2019-09-01 NOTE — Progress Notes (Signed)
Subjective:  Patient ID: Brad May, adult    DOB: March 24, 1940  Age: 79 y.o. MRN: [id number]  CC: Joint Pain (hands swollen, hips, shoulders, hands)   HPI Brad May presents for onset about 6 weeks ago of joint pain has been increasing.  Located at the posterior neck at the base running through the upper shoulders over the trapezius.  Also involves the lower back he points to the SI joints bilaterally.  He has pain in the legs as well.  His main concerns is that his hands are swollen significantly bilaterally he had a carpal tunnel release and ulnar release on the left about 6 weeks ago but the right was not operated upon.  However, the swelling is bilateral.  He can even make a fist because of the swelling.  He notes that he has had several bites on his legs.  He had [facility] spotted fever several years ago and wonders if this was recurrence.  He asked for blood test to be drawn in approximately notes that his RMSF and Lyme's titers are negative.  At the same time we also did a rheumatoid factor that was negative and a sed rate that came back 18.  His blood count was normal and there is no sign in the blood test of recurrent lymphoma.  Depression screen [facility] 2/9 09/01/2019 07/07/2019 01/06/2019  Decreased Interest 0 0 0  Down, Depressed, Hopeless 0 0 0  PHQ - 2 Score 0 0 0    History Brad May has a past medical history of BPH (benign prostatic hyperplasia), Duodenitis, Hyperlipidemia, Non Hodgkin's lymphoma ([place]) (50/27/7412), Periumbilical hernia, and Thyroid disease.   He has a past surgical history that includes Appendectomy; Inguinal hernia repair (2006); Colonoscopy w/ polypectomy; Colonoscopy; and Polypectomy.   His family history includes COPD in his brother; Dementia in his paternal grandfather; Heart attack (age of onset: 73) in his father; Heart disease in his father and mother; Heart failure in his mother; Suicidality in his daughter.He reports that he has never smoked.  He has never used smokeless tobacco. He reports current alcohol use. He reports that he does not use drugs.    ROS Review of Systems  Constitutional: Negative.   HENT: Negative.   Eyes: Negative for visual disturbance.  Respiratory: Negative for cough and shortness of breath.   Cardiovascular: Negative for chest pain and leg swelling.  Gastrointestinal: Negative for abdominal pain, diarrhea, nausea and vomiting.  Genitourinary: Negative for difficulty urinating.  Musculoskeletal: Positive for arthralgias and myalgias.  Skin: Negative for rash.  Neurological: Negative for headaches.  Psychiatric/Behavioral: Negative for sleep disturbance.    Objective:  BP (!) 153/92   Pulse 83   Temp (!) 97.2 F (36.2 C) (Temporal)   Resp 20   Ht 5\' 8"  (1.727 m)   Wt 174 lb 4 oz (79 kg)   SpO2 99%   BMI 26.49 kg/m   BP Readings from Last 3 Encounters:  09/01/19 (!) 153/92  07/07/19 126/83  01/06/19 132/88    Wt Readings from Last 3 Encounters:  09/01/19 174 lb 4 oz (79 kg)  07/07/19 172 lb 8 oz (78.2 kg)  01/06/19 176 lb 9.6 oz (80.1 kg)     Physical Exam Constitutional:      General: He is not in acute distress.    Appearance: He is well-developed.  HENT:     Head: Normocephalic and atraumatic.     Right Ear: External ear normal.     Left  Ear: External ear normal.     Nose: Nose normal.  Eyes:     Conjunctiva/sclera: Conjunctivae normal.     Pupils: Pupils are equal, round, and reactive to light.  Cardiovascular:     Rate and Rhythm: Normal rate and regular rhythm.     Heart sounds: Normal heart sounds. No murmur heard.   Pulmonary:     Effort: Pulmonary effort is normal. No respiratory distress.     Breath sounds: Normal breath sounds. No wheezing or rales.  Abdominal:     Palpations: Abdomen is soft.     Tenderness: There is no abdominal tenderness.  Musculoskeletal:        General: Normal range of motion.     Cervical back: Normal range of motion and neck  supple.  Skin:    General: Skin is warm and dry.  Neurological:     Mental Status: He is alert and oriented to person, place, and time.     Deep Tendon Reflexes: Reflexes are normal and symmetric.  Psychiatric:        Behavior: Behavior normal.        Thought Content: Thought content normal.        Judgment: Judgment normal.       Assessment & Plan:   Ersel was seen today for joint pain.  Diagnoses and all orders for this visit:  Primary osteoarthritis involving multiple joints -     nabumetone (RELAFEN) 500 MG tablet; Take 2 tablets (1,000 mg total) by mouth 2 (two) times daily. For muscle and joint pain -     betamethasone acetate-betamethasone sodium phosphate (CELESTONE) injection 6 mg  Hypothyroidism due to acquired atrophy of thyroid  Mixed hyperlipidemia  Indolent lymphoma ([place])       I am having Brad John A. Reinard "[name]" start on nabumetone. I am also having him maintain his aspirin, fish oil-omega-3 fatty acids, b complex vitamins, Cholecalciferol, Probiotic Product (PROBIOTIC-10 PO), atorvastatin, levothyroxine, niacin, metroNIDAZOLE, and sildenafil. We administered betamethasone acetate-betamethasone sodium phosphate.  Allergies as of 09/01/2019      Reactions   Sulfonamide Derivatives    REACTION: unknown      Medication List       Accurate as of September 01, 2019  9:42 PM. If you have any questions, ask your nurse or doctor.        aspirin 81 MG tablet Take 81 mg by mouth daily.   atorvastatin 80 MG tablet Commonly known as: LIPITOR Take 1 tablet (80 mg total) by mouth daily.   b complex vitamins tablet Take 1 tablet by mouth daily.   fish oil-omega-3 fatty acids 1000 MG capsule Take 1 g by mouth daily.   levothyroxine 88 MCG tablet Commonly known as: SYNTHROID Take 1 tablet (88 mcg total) by mouth daily.   metroNIDAZOLE 1 % gel Commonly known as: Metrogel Apply topically daily. To affected areas of face   nabumetone 500 MG  tablet Commonly known as: RELAFEN Take 2 tablets (1,000 mg total) by mouth 2 (two) times daily. For muscle and joint pain Started by: Claretta Fraise, MD   niacin 500 MG CR tablet Commonly known as: NIASPAN Take 1 tablet (500 mg total) by mouth at bedtime.   PROBIOTIC-10 PO Take 1 capsule by mouth daily.   sildenafil 20 MG tablet Commonly known as: REVATIO TAKE 2-5 TABLETS BY MOUTH AS NEEDED FOR SEXUAL ACTIVITY   Vitamin D-1000 Max St 25 MCG (1000 UT) tablet Generic drug: Cholecalciferol Take 2,000 Units by mouth  daily.        Follow-up: Return in about 6 weeks (around 10/13/2019).  Claretta Fraise, M.D.

## 2019-09-10 ENCOUNTER — Other Ambulatory Visit: Payer: Self-pay | Admitting: Family Medicine

## 2019-09-10 LAB — ROCKY MTN SPOTTED FVR ABS PNL(IGG+IGM)
RMSF IgG: NEGATIVE
RMSF IgM: 1.6 index — ABNORMAL HIGH (ref 0.00–0.89)

## 2019-09-10 MED ORDER — DOXYCYCLINE HYCLATE 100 MG PO CAPS: 100.0000 mg | ORAL_CAPSULE | Freq: Two times a day (BID) | ORAL | 0 refills | Status: DC

## 2019-09-18 ENCOUNTER — Telehealth: Payer: Self-pay | Admitting: Family Medicine

## 2019-09-18 DIAGNOSIS — M159 Polyosteoarthritis, unspecified: Secondary | ICD-10-CM

## 2019-09-18 DIAGNOSIS — M150 Primary generalized (osteo)arthritis: Secondary | ICD-10-CM

## 2019-09-18 NOTE — Telephone Encounter (Signed)
Whether he has [facility] fever or not I can go ahead and refill that medicine.  With regard to [facility] spotted fever still being there.  That depends on his symptoms.  If he is symptom-free and he has healed from the disease.  We could do a retest but that should be done 2 or more months after diagnosis.

## 2019-09-18 NOTE — Telephone Encounter (Signed)
Pt states he has one day of the Relafen left and wanting to see if he needs lab work before requesting a refill of this medicine to see if he still has [facility] spotted fever.

## 2019-09-22 MED ORDER — DOXYCYCLINE HYCLATE 100 MG PO CAPS: 100.0000 mg | ORAL_CAPSULE | Freq: Two times a day (BID) | ORAL | 0 refills | Status: DC

## 2019-09-22 NOTE — Telephone Encounter (Signed)
Refill sent patient aware  

## 2019-10-08 ENCOUNTER — Other Ambulatory Visit: Payer: Self-pay

## 2019-10-08 ENCOUNTER — Telehealth: Payer: Self-pay | Admitting: Family Medicine

## 2019-10-08 ENCOUNTER — Other Ambulatory Visit: Payer: Self-pay | Admitting: Family Medicine

## 2019-10-08 DIAGNOSIS — A770 Spotted fever due to Rickettsia rickettsii: Secondary | ICD-10-CM

## 2019-10-08 NOTE — Telephone Encounter (Signed)
We will look at these concerns at his appointment. I have entered the RMSF titer order so he can come in for that tomorrow.

## 2019-10-08 NOTE — Telephone Encounter (Signed)
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  He is now taking Relafen, two tablets twice a day, but says it is not helping with his pain.  He is having severe pain all over his body and is wondering if there is something else that could be sent in instead and/or if you recommend he see a specialist.  Also, he has a follow up appointment scheduled with you next week and is wondering if you recommend he come in tomorrow to be retested so that the results will be here when he comes in next week.

## 2019-10-09 ENCOUNTER — Other Ambulatory Visit: Payer: Self-pay

## 2019-10-09 ENCOUNTER — Other Ambulatory Visit: Payer: Medicare Other

## 2019-10-09 NOTE — Telephone Encounter (Signed)
Pt aware of provider feedback and voiced understanding and will come in today for labs.

## 2019-10-13 LAB — ROCKY MTN SPOTTED FVR ABS PNL(IGG+IGM)
RMSF IgG: NEGATIVE
RMSF IgM: 0.83 index (ref 0.00–0.89)

## 2019-10-14 ENCOUNTER — Other Ambulatory Visit: Payer: Self-pay

## 2019-10-14 ENCOUNTER — Ambulatory Visit (INDEPENDENT_AMBULATORY_CARE_PROVIDER_SITE_OTHER): Payer: Medicare Other | Admitting: Family Medicine

## 2019-10-14 ENCOUNTER — Encounter: Payer: Self-pay | Admitting: Family Medicine

## 2019-10-14 VITALS — BP 132/82 | HR 91 | Temp 97.9°F | Resp 20 | Ht 68.0 in | Wt 173.0 lb

## 2019-10-14 DIAGNOSIS — R350 Frequency of micturition: Secondary | ICD-10-CM | POA: Diagnosis not present

## 2019-10-14 DIAGNOSIS — M791 Myalgia, unspecified site: Secondary | ICD-10-CM

## 2019-10-14 DIAGNOSIS — M2550 Pain in unspecified joint: Secondary | ICD-10-CM

## 2019-10-14 LAB — MICROSCOPIC EXAMINATION
Bacteria, UA: NONE SEEN
Epithelial Cells (non renal): NONE SEEN /hpf (ref 0–10)
WBC, UA: NONE SEEN /hpf (ref 0–5)

## 2019-10-14 LAB — URINALYSIS, COMPLETE
Bilirubin, UA: NEGATIVE
Glucose, UA: NEGATIVE
Ketones, UA: NEGATIVE
Leukocytes,UA: NEGATIVE
Nitrite, UA: NEGATIVE
Protein,UA: NEGATIVE
Specific Gravity, UA: 1.025 (ref 1.005–1.030)
Urobilinogen, Ur: 0.2 mg/dL (ref 0.2–1.0)
pH, UA: 6.5 (ref 5.0–7.5)

## 2019-10-14 MED ORDER — METHYLPREDNISOLONE ACETATE 80 MG/ML IJ SUSP: 80.0000 mg | Freq: Once | INTRAMUSCULAR | Status: DC

## 2019-10-14 MED ORDER — TIZANIDINE HCL 4 MG PO CAPS: 4.0000 mg | ORAL_CAPSULE | Freq: Three times a day (TID) | ORAL | 2 refills | Status: DC | PRN

## 2019-10-14 NOTE — Patient Instructions (Signed)
Muscle Pain, Adult Muscle pain (myalgia) may be mild or severe. In most cases, the pain lasts only a short time and it goes away without treatment. It is normal to feel some muscle pain after starting a workout program. Muscles that have not been used often will be sore at first. Muscle pain may also be caused by many other things, including:  Overuse or muscle strain, especially if you are not in shape. This is the most common cause of muscle pain.  Injury.  Bruises.  Viruses, such as the flu.  Infectious diseases.  A chronic condition that causes muscle tenderness, fatigue, and headache (fibromyalgia).  A condition, such as lupus, in which the body's disease-fighting system attacks other organs in the body (autoimmune or rheumatologic diseases).  Certain drugs, including ACE inhibitors and statins. To diagnose the cause of your muscle pain, your health care provider will do a physical exam and ask questions about the pain and when it began. If you have not had muscle pain for very long, your health care provider may want to wait before doing much testing. If your muscle pain has lasted a long time, your health care provider may want to run tests right away. In some cases, this may include tests to rule out certain conditions or illnesses. Treatment for muscle pain depends on the cause. Home care is often enough to relieve muscle pain. Your health care provider may also prescribe anti-inflammatory medicine. Follow these instructions at home: Activity  If overuse is causing your muscle pain: ? Slow down your activities until the pain goes away. ? Do regular, gentle exercises if you are not usually active. ? Warm up before exercising. Stretch before and after exercising. This can help lower the risk of muscle pain.  Do not continue working out if the pain is very bad. Bad pain could mean that you have injured a muscle. Managing pain and discomfort   If directed, apply ice to the sore  muscle: ? Put ice in a plastic bag. ? Place a towel between your skin and the bag. ? Leave the ice on for 20 minutes, 2-3 times a day.  You may also alternate between applying ice and applying heat as told by your health care provider. To apply heat, use the heat source that your health care provider recommends, such as a moist heat pack or a heating pad. ? Place a towel between your skin and the heat source. ? Leave the heat on for 20-30 minutes. ? Remove the heat if your skin turns bright red. This is especially important if you are unable to feel pain, heat, or cold. You may have a greater risk of getting burned. Medicines  Take over-the-counter and prescription medicines only as told by your health care provider.  Do not drive or use heavy machinery while taking prescription pain medicine. Contact a health care provider if:  Your muscle pain gets worse and medicines do not help.  You have muscle pain that lasts longer than 3 days.  You have a rash or fever along with muscle pain.  You have muscle pain after a tick bite.  You have muscle pain while working out, even though you are in good physical condition.  You have redness, soreness, or swelling along with muscle pain.  You have muscle pain after starting a new medicine or changing the dose of a medicine. Get help right away if:  You have trouble breathing.  You have trouble swallowing.  You have muscle   pain along with a stiff neck, fever, and vomiting.  You have severe muscle weakness or cannot move part of your body. This information is not intended to replace advice given to you by your health care provider. Make sure you discuss any questions you have with your health care provider. Document Revised: 12/14/2016 Document Reviewed: 05/24/2015 Elsevier Patient Education  [place]. Joint Pain  Joint pain can be caused by many things. It is likely to go away if you follow instructions from your doctor for  taking care of yourself at home. Sometimes, you may need more treatment. Follow these instructions at home: Managing pain, stiffness, and swelling   If told, put ice on the painful area. ? Put ice in a plastic bag. ? Place a towel between your skin and the bag. ? Leave the ice on for 20 minutes, 2-3 times a day.  If told, put heat on the painful area. Do this as often as told by your doctor. Use the heat source that your doctor recommends, such as a moist heat pack or a heating pad. ? Place a towel between your skin and the heat source. ? Leave the heat on for 20-30 minutes. ? Take off the heat if your skin gets bright red. This is especially important if you are unable to feel pain, heat, or cold. You may have a greater risk of getting burned.  Move your fingers or toes below the painful joint often. This helps with stiffness and swelling.  If possible, raise (elevate) the painful joint above the level of your heart while you are sitting or lying down. To do this, try putting a few pillows under the painful joint. Activity  Rest the painful joint for as long as told. Do not do things that cause pain or make your pain worse.  Begin exercising or stretching the affected area, as told by your doctor. Ask your doctor what types of exercise are safe for you. If you have an elastic bandage, sling, or splint:  Wear the device as told by your doctor. Take it off only as told by your doctor.  Loosen the device if your fingers or toes below the joint: ? Tingle. ? Lose feeling (get numb). ? Get cold and blue.  Keep the device clean.  Ask your doctor if you should take off the device before bathing. You may need to cover it with a watertight covering when you take a bath or a shower. General instructions  Take over-the-counter and prescription medicines only as told by your doctor.  Do not use any products that contain nicotine or tobacco. These include cigarettes and e-cigarettes. If you  need help quitting, ask your doctor.  Keep all follow-up visits as told by your doctor. This is important. Contact a doctor if:  You have pain that gets worse and does not get better with medicine.  Your joint pain does not get better in 3 days.  You have more bruising or swelling.  You have a fever.  You lose 10 lb (4.5 kg) or more without trying. Get help right away if:  You cannot move the joint.  Your fingers or toes tingle, lose feeling, or get cold and blue.  You have a fever along with a joint that is red, warm, and swollen. Summary  Joint pain can be caused by many things. It often goes away if you follow instructions from your doctor for taking care of yourself at home.  Rest the painful joint  for as long as told. Do not do things that cause pain or make your pain worse.  Take over-the-counter and prescription medicines only as told by your doctor. This information is not intended to replace advice given to you by your health care provider. Make sure you discuss any questions you have with your health care provider. Document Revised: 12/14/2016 Document Reviewed: 10/17/2016 Elsevier Patient Education  [place].

## 2019-10-14 NOTE — Progress Notes (Signed)
Subjective: CC: joint and muscle pain PCP: Brad Fraise, MD  JFH:[id number] A May is a 79 y.o. adult presenting to clinic with his wife today for:  1. Joint and Muscle pain Brad May reports joint and muscle pain for the last 6 weeks. The pain is in his hands, elbows, shoulders, hips, and knee. He reports the pain as a stiffness and ache that is worse in the morning and improves with movement. The pain has worsened over the last 6 weeks. If he sits for 20-30 minutes he has a lot of stiffness that makes it difficult to get up and move until he walks for a little bit. He also has swelling in his fingers and reports very little grip strength and difficulty moving his fingers. He has difficulty rating his pain on a scale but reports that he has affected his life significantly as he is not able to do his typically activities such as golf. He also has difficultly sleeping because he wakes up with joint pain. 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 He denies joint pain previously. He has completed 2 rounds of doxycycline. He reports tylenol arthritis and  Advil has helped some. He has tried Relafen without much relief. He also reports frequent urination for about 6 weeks. He denies dysuria, abdominal pain, weak stream, abdominal pain, nausea, or vomiting. He reports a decreased appetite for the last 6 weeks. Denies fever, rash, dizziness, chest pain, and shortness of breath.   Relevant past medical, surgical, family, and social history reviewed and updated as indicated.  Allergies and medications reviewed and updated.  Allergies  Allergen Reactions   Sulfonamide Derivatives     REACTION: unknown   Past Medical History:  Diagnosis Date   BPH (benign prostatic hyperplasia)    Duodenitis    Hyperlipidemia    Non Hodgkin's lymphoma ([place]) 63/89/3734   Periumbilical hernia    Thyroid disease     Current Outpatient Medications:    aspirin 81 MG tablet, Take 81  mg by mouth daily.  , Disp: , Rfl:    atorvastatin (LIPITOR) 80 MG tablet, Take 1 tablet (80 mg total) by mouth daily., Disp: 90 tablet, Rfl: 3   b complex vitamins tablet, Take 1 tablet by mouth daily.  , Disp: , Rfl:    Cholecalciferol (VITAMIN D-1000 MAX ST) 1000 units tablet, Take 2,000 Units by mouth daily. , Disp: , Rfl:    fish oil-omega-3 fatty acids 1000 MG capsule, Take 1 g by mouth daily.  , Disp: , Rfl:    HYDROcodone-acetaminophen (NORCO/VICODIN) 5-325 MG tablet, Take 1 tablet by mouth every 6 (six) hours as needed., Disp: , Rfl:    levothyroxine (SYNTHROID) 88 MCG tablet, Take 1 tablet (88 mcg total) by mouth daily., Disp: 90 tablet, Rfl: 3   metroNIDAZOLE (METROGEL) 1 % gel, Apply topically daily. To affected areas of face, Disp: 45 g, Rfl: 11   nabumetone (RELAFEN) 500 MG tablet, Take 2 tablets (1,000 mg total) by mouth 2 (two) times daily. For muscle and joint pain, Disp: 360 tablet, Rfl: 1   niacin (NIASPAN) 500 MG CR tablet, Take 1 tablet (500 mg total) by mouth at bedtime., Disp: 90 tablet, Rfl: 1   Probiotic Product (PROBIOTIC-10 PO), Take 1 capsule by mouth daily., Disp: , Rfl:    sildenafil (REVATIO) 20 MG tablet, TAKE 2-5 TABLETS BY MOUTH AS NEEDED FOR SEXUAL ACTIVITY, Disp: 50 tablet, Rfl: 5 Social History   Socioeconomic History  Marital status: Married    Spouse name: Not on file   Number of children: 2   Years of education: Not on file   Highest education level: Doctorate  Occupational History   Occupation: High school Principal  Tobacco Use   Smoking status: Never Smoker   Smokeless tobacco: Never Used  Scientific laboratory technician Use: Never used  Substance and Sexual Activity   Alcohol use: Yes    Comment: 1-2 month   Drug use: No   Sexual activity: Yes  Other Topics Concern   Not on file  Social History Narrative   Lives at home with wife.  Two children.  Daughter died suicide.  Two grands.  Retired Tourist information centre manager.     Social Determinants  of Health   Financial Resource Strain:    Difficulty of Paying Living Expenses: Not on file  Food Insecurity:    Worried About Charity fundraiser in the Last Year: Not on file   [employer] of Food in the Last Year: Not on file  Transportation Needs:    Lack of Transportation (Medical): Not on file   Lack of Transportation (Non-Medical): Not on file  Physical Activity:    Days of Exercise per Week: Not on file   Minutes of Exercise per Session: Not on file  Stress:    Feeling of Stress : Not on file  Social Connections:    Frequency of Communication with Friends and Family: Not on file   Frequency of Social Gatherings with Friends and Family: Not on file   Attends Religious Services: Not on file   Active Member of Clubs or Organizations: Not on file   Attends Archivist Meetings: Not on file   Marital Status: Not on file  Intimate Partner Violence:    Fear of Current or Ex-Partner: Not on file   Emotionally Abused: Not on file   Physically Abused: Not on file   Sexually Abused: Not on file   Family History  Problem Relation Age of Onset   Heart disease Mother    Heart failure Mother        age 24   Heart disease Father    Heart attack Father 44   COPD Brother    Suicidality Daughter    Dementia Paternal Grandfather    Colon cancer Neg Hx    Stomach cancer Neg Hx    Esophageal cancer Neg Hx    Liver cancer Neg Hx    Pancreatic cancer Neg Hx    Prostate cancer Neg Hx    Rectal cancer Neg Hx    Ulcerative colitis Neg Hx     Review of Systems  Per HPI.  Objective: Office vital signs reviewed. BP 132/82    Pulse 91    Temp 97.9 F (36.6 C) (Temporal)    Resp 20    Ht 5\' 8"  (1.727 m)    Wt 173 lb (78.5 kg)    SpO2 98%    BMI 26.30 kg/m   Physical Examination:  Physical Exam Vitals and nursing note reviewed.  Constitutional:      General: He is not in acute distress.    Appearance: Normal appearance. He is not  ill-appearing or diaphoretic.  Cardiovascular:     Rate and Rhythm: Normal rate and regular rhythm.     Heart sounds: Normal heart sounds. No murmur heard.   Pulmonary:     Effort: Pulmonary effort is normal.     Breath sounds: Normal  breath sounds.  Abdominal:     General: Bowel sounds are normal. There is no distension.     Palpations: Abdomen is soft. There is no mass.     Tenderness: There is no abdominal tenderness. There is no right CVA tenderness, left CVA tenderness, guarding or rebound.     Hernia: No hernia is present.  Musculoskeletal:     Right shoulder: Normal.     Left shoulder: Normal.     Right elbow: Normal.     Left elbow: Normal.     Cervical back: Normal range of motion and neck supple. No rigidity or tenderness.     Right hip: No deformity or tenderness. Decreased range of motion. Decreased strength.     Left hip: No deformity or tenderness. Decreased range of motion. Decreased strength.     Right knee: No swelling or bony tenderness. Normal range of motion. No tenderness.     Left knee: No swelling or bony tenderness. Normal range of motion. No tenderness.     Right lower leg: No tenderness or bony tenderness. No edema.     Left lower leg: No tenderness or bony tenderness. No edema.     Right ankle: No swelling. No tenderness. Normal range of motion.     Left ankle: No swelling. No tenderness. Normal range of motion.     Comments: Swelling on fingers bilaterally. Decreased ROM bilaterally. Decreased grip strength noted. No warmth, erythema, or TTP.  Lymphadenopathy:     Cervical: No cervical adenopathy.  Skin:    General: Skin is warm and dry.     Capillary Refill: Capillary refill takes less than 2 seconds.  Neurological:     General: No focal deficit present.     Mental Status: He is alert and oriented to person, place, and time.     Gait: Gait normal.  Psychiatric:        Mood and Affect: Mood normal.        Behavior: Behavior normal.      Results  for orders placed or performed in visit on 10/08/19  [place] spotted fvr abs pnl(IgG+IgM)  Result Value Ref Range   RMSF IgG Negative Negative   RMSF IgM 0.83 0.00 - 0.89 index     Assessment/ Plan: Brad was seen today for 6 week follow up.  Diagnoses and all orders for this visit:  Frequent urination No indication for infection today. RTO for new or worsening symptoms, or if symptoms persist for further evaluation.  -     Urinalysis, Complete -     Urine Culture  Muscle pain Labs pending as below. Zanaflex ordered. Continue tylenol. Rest, ice, heat, activity as tolerated. Follow up for new or worsening symptoms.  -     CK total and CKMB (cardiac)not at [facility] -     tiZANidine (ZANAFLEX) 4 MG capsule; Take 1 capsule (4 mg total) by mouth 3 (three) times daily as needed for muscle spasms.  Arthralgia, unspecified joint RMSF IGM levels decreased to 0.83 from 1.60. Labs pending as below. Continue tylenol, rest, ice, heat, and activity as tolerated. Steroid injection today. Patient requests a referral at this time. Discussed infectious disease vs. rheumatology referral depending on results of lab work ordered today. Patient agreed with this plan. He would prefer to remain in the [facility] system for the referral. Follow up with new or worsening symptoms.  -     methylPREDNISolone acetate (DEPO-MEDROL) injection 80 mg -     ANA Comprehensive  Panel  The above assessment and management plan was discussed with the patient. The patient verbalized understanding of and has agreed to the management plan. Patient is aware to call the clinic if symptoms persist or worsen. Patient is aware when to return to the clinic for a follow-up visit. Patient educated on when it is appropriate to go to the emergency department.   Marjorie Smolder, FNP-C [facility] [address] [place], [place] [address] [phone]

## 2019-10-15 ENCOUNTER — Other Ambulatory Visit: Payer: Self-pay | Admitting: Family Medicine

## 2019-10-15 ENCOUNTER — Encounter: Payer: Self-pay | Admitting: *Deleted

## 2019-10-15 DIAGNOSIS — A770 Spotted fever due to Rickettsia rickettsii: Secondary | ICD-10-CM

## 2019-10-15 DIAGNOSIS — M2550 Pain in unspecified joint: Secondary | ICD-10-CM

## 2019-10-15 DIAGNOSIS — M791 Myalgia, unspecified site: Secondary | ICD-10-CM

## 2019-10-15 LAB — URINE CULTURE: Organism ID, Bacteria: NO GROWTH

## 2019-10-15 LAB — ANA COMPREHENSIVE PANEL
Anti JO-1: <0.2 AI (ref 0.0–0.9)
Centromere Ab Screen: <0.2 AI (ref 0.0–0.9)
Chromatin Ab SerPl-aCnc: <0.2 AI (ref 0.0–0.9)
ENA RNP Ab: <0.2 AI (ref 0.0–0.9)
ENA SM Ab Ser-aCnc: <0.2 AI (ref 0.0–0.9)
ENA SSA (RO) Ab: <0.2 AI (ref 0.0–0.9)
ENA SSB (LA) Ab: <0.2 AI (ref 0.0–0.9)
Scleroderma (Scl-70) (ENA) Antibody, IgG: <0.2 AI (ref 0.0–0.9)
dsDNA Ab: 3 IU/mL (ref 0–9)

## 2019-10-15 LAB — CK TOTAL AND CKMB (NOT AT ARMC)
CK-MB Index: 3.2 ng/mL (ref 0.0–10.4)
Total CK: 103 U/L (ref 41–331)

## 2019-10-21 ENCOUNTER — Encounter: Payer: Self-pay | Admitting: Internal Medicine

## 2019-10-21 ENCOUNTER — Other Ambulatory Visit: Payer: Self-pay

## 2019-10-21 ENCOUNTER — Ambulatory Visit (INDEPENDENT_AMBULATORY_CARE_PROVIDER_SITE_OTHER): Payer: Medicare Other | Admitting: Internal Medicine

## 2019-10-21 VITALS — BP 136/85 | HR 71 | Wt 169.0 lb

## 2019-10-21 DIAGNOSIS — M130 Polyarthritis, unspecified: Secondary | ICD-10-CM | POA: Diagnosis present

## 2019-10-21 DIAGNOSIS — E034 Atrophy of thyroid (acquired): Secondary | ICD-10-CM | POA: Diagnosis not present

## 2019-10-21 DIAGNOSIS — A770 Spotted fever due to Rickettsia rickettsii: Secondary | ICD-10-CM | POA: Diagnosis not present

## 2019-10-21 NOTE — Patient Instructions (Signed)
Thank you for coming to see me today. It was a pleasure.   Today we talked about:  -- we will check some further labs to look for possible viral causes of your symptoms but I expect they will likely be negative -- we will also send some other screening autoimmune labs and recheck some basic labs for you   If you have any questions or concerns, please do not hesitate to call the office at [phone].  Take Care,   Jule Ser, DO

## 2019-10-21 NOTE — Assessment & Plan Note (Signed)
His presentation is not entirely clear and I am unsure how much is related his recent diagnosis of possible RMSF.  His initial RMSF IgM was positive, IgG negative which could have been an acute infection, but I would have expected his IgG to then be positive when checked again several weeks later. IgM and IgG antibodies typically appear 7 to 10 days after the onset of the illness.  A fourfold rise in IgG titers between acute and convalescent sera is diagnostic of seroconversion and recent illness which he did not have.  One possible explanation is that early treatment for RMSF may blunt antibody responses.  There was a report where a few patients who were treated within the first 48 hours after onset of symptoms did not develop convalescent antibodies. However, the frequency at which this phenomenon occurs is unknown.  Additionally, IgM assays alone should not be used to make a diagnosis of acute RMSF as a false-positive IgM titer may occur.   He also did not have symptoms consistent with RMSF such as fever, chills, headache, GI symptoms, headache, photophobia, focal neurologic deficits, and his labs did not reveal thrombocytopenia.  CMP was not done to evaluate for LFT abnormalities.  Interestingly, his symptoms have now greatly improved with a steroid injection last week which would point away from an infectious etiology to his presentation.  I have no suspicion for Lyme given that he removed a tick within 24 hours and Lyme is not endemic in this area.  Plan: -- I will add on labs today to evaluate for other viral etiologies of polyarthralgia although my suspicion for them as a cause is relatively low -- Check HCV Ab, Hepatitis B serologies -- Repeat inflammatory markers ESR, CRP -- Will do autoimmune labs that I do not think were done (anti-CCP, repeat RF since was done earlier in course).  ANA, urinalysis done just last week. -- Check CBC, CMP, TSH -- RTC PRN pending lab results

## 2019-10-21 NOTE — Progress Notes (Signed)
[facility]  Reason for Consult: RMSF Referring Provider:  Dr Livia Snellen  HPI:  Brad May is a 79 y.o. adult man who presents to clinic for further evaluation of RMSF serologies being positive.     Patient was referred by his primary care office for further evaluation of joint and muscle pain in the setting of a diagnosis of [place] spotted fever in August.  Patient was seen at his PCP office on August 17 and again on September 29.    He reports joint and muscle pain since late July / early August.  Pain and soreness has been present in multiple joints including hands, elbows, shoulders, and hips.  In reviewing the prior notes, his pain has been described as stiffness and achiness that improves with movement.  It had progressed over several weeks when seen by his PCP.  He endorsed swelling in his fingers and decreased grip strength.  This pain had began to affect his life as he was not able to do his typical daily activities (i.e. golf).  When the pain started he had concern for a tick bite vs arthritis.  He pulled a couple ticks off his leg that had been on him for no more than 24 hrs since he showers nightly.  Serologies were sent and his RMSF IgM was positive, IgG negative.  He received a 10 day course of doxycycline and then a second round due to persistent symptoms.  Prior to this summer he really had no joint pain.  He was given a steroid injection in the clinic on 9/29.  He is feeling significantly better today.   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  He also had a recent carpal tunnel surgery on the left hand prior to symptoms developing.  He otherwise has not had any recent vaccines, surgery, or procedures.   He denies any fever, rash, dizziness, chest pain, shortness of breath.  Denies GI symptoms.  No headache or photophobia.  His work-up thus far has been unremarkable.   In June and August his CBC was normal, CMP normal with the exception of glucose 109 in August.  TSH normal.  Lipid panel unremarkable.  ANA comprehensive panel negative, ESR negative, arthritis panel negative. Lyme sserology negative.    August RMSF IgG negative, IgM 1.6 September RMSF IgG negative, IgM 0.83   Patient's Medications  New Prescriptions   No medications on file  Previous Medications   ASPIRIN 81 MG TABLET    Take 81 mg by mouth daily.     ATORVASTATIN (LIPITOR) 80 MG TABLET    Take 1 tablet (80 mg total) by mouth daily.   B COMPLEX VITAMINS TABLET    Take 1 tablet by mouth daily.     CHOLECALCIFEROL (VITAMIN D-1000 MAX ST) 1000 UNITS TABLET    Take 2,000 Units by mouth daily.    FISH OIL-OMEGA-3 FATTY ACIDS 1000 MG CAPSULE    Take 1 g by mouth daily.     HYDROCODONE-ACETAMINOPHEN (NORCO/VICODIN) 5-325 MG TABLET    Take 1 tablet by mouth every 6 (six) hours as needed.   LEVOTHYROXINE (SYNTHROID) 88 MCG TABLET    Take 1 tablet (88 mcg total) by mouth daily.   METRONIDAZOLE (METROGEL) 1 % GEL    Apply topically daily. To affected areas of face   NABUMETONE (RELAFEN) 500 MG TABLET    Take 2 tablets (  1,000 mg total) by mouth 2 (two) times daily. For muscle and joint pain   NIACIN (NIASPAN) 500 MG CR TABLET    Take 1 tablet (500 mg total) by mouth at bedtime.   PROBIOTIC PRODUCT (PROBIOTIC-10 PO)    Take 1 capsule by mouth daily.    SILDENAFIL (REVATIO) 20 MG TABLET    TAKE 2-5 TABLETS BY MOUTH AS NEEDED FOR SEXUAL ACTIVITY   TIZANIDINE (ZANAFLEX) 4 MG CAPSULE    Take 1 capsule (4 mg total) by mouth 3 (three) times daily as needed for muscle spasms.  Modified Medications   No medications on file  Discontinued Medications   No medications on file      Past Medical History:  Diagnosis Date  . BPH (benign prostatic hyperplasia)   . Duodenitis   . Hyperlipidemia   . Non Hodgkin's lymphoma ([place]) 11/27/2010  . Periumbilical hernia   . Thyroid disease     Social History    Tobacco Use  . Smoking status: Never Smoker  . Smokeless tobacco: Never Used  Vaping Use  . Vaping Use: Never used  Substance Use Topics  . Alcohol use: Yes    Comment: 1-2 month  . Drug use: No    Family History  Problem Relation Age of Onset  . Heart disease Mother   . Heart failure Mother        age 63  . Heart disease Father   . Heart attack Father 80  . COPD Brother   . Suicidality Daughter   . Dementia Paternal Grandfather   . Colon cancer Neg Hx   . Stomach cancer Neg Hx   . Esophageal cancer Neg Hx   . Liver cancer Neg Hx   . Pancreatic cancer Neg Hx   . Prostate cancer Neg Hx   . Rectal cancer Neg Hx   . Ulcerative colitis Neg Hx     Allergies  Allergen Reactions  . Sulfonamide Derivatives     REACTION: unknown    Review of Systems: Review of Systems  Constitutional: Negative for chills, fever and malaise/fatigue.  Respiratory: Negative for cough and shortness of breath.   Cardiovascular: Negative for chest pain.  Gastrointestinal: Negative for abdominal pain, nausea and vomiting.  Musculoskeletal: Positive for joint pain and myalgias.       Improved   Skin: Negative for rash.  Neurological: Positive for weakness. Negative for loss of consciousness and headaches.  Psychiatric/Behavioral: Negative.     Objective: Vitals:   10/21/19 1614  BP: 136/85  Pulse: 71  SpO2: 97%  Weight: 169 lb (76.7 kg)     Body mass index is 25.7 kg/m.  Physical Exam Constitutional:      General: He is not in acute distress.    Appearance: Normal appearance.  HENT:     Head: Normocephalic and atraumatic.     Nose: Nose normal.  Eyes:     General: No scleral icterus.    Extraocular Movements: Extraocular movements intact.     Conjunctiva/sclera: Conjunctivae normal.  Cardiovascular:     Rate and Rhythm: Normal rate and regular rhythm.     Heart sounds: No murmur heard.   Pulmonary:     Effort: Pulmonary effort is normal.     Breath sounds: Normal  breath sounds.  Abdominal:     General: Abdomen is flat. There is no distension.     Palpations: Abdomen is soft.  Musculoskeletal:        General: No swelling or tenderness.  Normal range of motion.     Cervical back: Normal range of motion and neck supple. No rigidity.  Skin:    General: Skin is warm and dry.     Findings: No rash.  Neurological:     General: No focal deficit present.     Mental Status: He is alert and oriented to person, place, and time.     Motor: No weakness.  Psychiatric:        Mood and Affect: Mood normal.        Behavior: Behavior normal.      Pertinent Labs and Microbiology:   CBC Latest Ref Rng & Units 08/25/2019 07/07/2019 01/06/2019  WBC 3.4 - 10.8 x10E3/uL 7.4 7.4 6.1  Hemoglobin 13.0 - 17.7 g/dL 15.5 15.2 16.6  Hematocrit 37.5 - 51.0 % 44.6 44.8 45.9  Platelets 150 - 450 x10E3/uL 228 203 204   CMP Latest Ref Rng & Units 07/07/2019 01/06/2019 07/25/2018  Glucose 65 - 99 mg/dL 109(H) 121(H) 103(H)  BUN 8 - 27 mg/dL _0 Creatinine 0.76 - 1.27 mg/dL 0.80 0.81 0.80  Sodium 134 - 144 mmol/L 139 138 136  Potassium 3.5 - 5.2 mmol/L 4.3 4.5 4.6  Chloride 96 - 106 mmol/L 104 102 100  CO2 20 - 29 mmol/L _1 Calcium 8.6 - 10.2 mg/dL 9.2 9.4 9.3  Total Protein 6.0 - 8.5 g/dL 6.4 6.5 -  Total Bilirubin 0.0 - 1.2 mg/dL 0.6 0.7 -  Alkaline Phos 48 - 121 IU/L 121 116 -  AST 0 - 40 IU/L 16 21 -  ALT 0 - 44 IU/L 15 26 -     Recent Results (from the past 240 hour(s))  Urine Culture     Status: None   Collection Time: 10/14/19  9:22 AM   Specimen: Urine   UR  Result Value Ref Range Status   Urine Culture, Routine Final report  Final   Organism ID, Bacteria No growth  Final  Microscopic Examination     Status: None   Collection Time: 10/14/19  9:22 AM   Urine  Result Value Ref Range Status   WBC, UA None seen 0 - 5 /hpf Final   RBC 0-2 0 - 2 /hpf Final   Epithelial Cells (non renal) None seen 0 - 10 /hpf Final   Bacteria, UA None seen None  seen/Few Final    Assessment and Plan: Polyarthritis His presentation is not entirely clear and I am unsure how much is related his recent diagnosis of possible RMSF.  His initial RMSF IgM was positive, IgG negative which could have been an acute infection, but I would have expected his IgG to then be positive when checked again several weeks later. IgM and IgG antibodies typically appear 7 to 10 days after the onset of the illness.  A fourfold rise in IgG titers between acute and convalescent sera is diagnostic of seroconversion and recent illness which he did not have.  One possible explanation is that early treatment for RMSF may blunt antibody responses.  There was a report where a few patients who were treated within the first 48 hours after onset of symptoms did not develop convalescent antibodies. However, the frequency at which this phenomenon occurs is unknown.  Additionally, IgM assays alone should not be used to make a diagnosis of acute RMSF as a false-positive IgM titer may occur.   He also did not have symptoms consistent with RMSF such as fever, chills, headache, GI  symptoms, headache, photophobia, focal neurologic deficits, and his labs did not reveal thrombocytopenia.  CMP was not done to evaluate for LFT abnormalities.  Interestingly, his symptoms have now greatly improved with a steroid injection last week which would point away from an infectious etiology to his presentation.  I have no suspicion for Lyme given that he removed a tick within 24 hours and Lyme is not endemic in this area.  Plan: -- I will add on labs today to evaluate for other viral etiologies of polyarthralgia although my suspicion for them as a cause is relatively low -- Check HCV Ab, Hepatitis B serologies -- Repeat inflammatory markers ESR, CRP -- Will do autoimmune labs that I do not think were done (anti-CCP, repeat RF since was done earlier in course).  ANA, urinalysis done just last week. 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 CBC w/Diff  . Sedimentation rate  . C-reactive protein  . Rheumatoid Factor  . Cyclic Citrul Peptide Antibody, IGG  . Comp Met (CMET)  . Hepatitis B core antibody, total  . Hepatitis B surface antibody,qualitative  . Hepatitis B surface antigen  . Hepatitis C antibody  . TSH     I spent greater than 60 minutes with the patient including greater than 50% of time in face to face counsel of the patient and in coordination of their care.   Raynelle High[facility] for Infectious [facility] 10/21/2019, 5:34 PM

## 2019-10-22 LAB — COMPREHENSIVE METABOLIC PANEL
AG Ratio: 1.7 (calc) (ref 1.0–2.5)
ALT: 17 U/L (ref 9–46)
AST: 16 U/L (ref 10–35)
Albumin: 4.3 g/dL (ref 3.6–5.1)
Alkaline phosphatase (APISO): 120 U/L (ref 35–144)
BUN: 19 mg/dL (ref 7–25)
CO2: 22 mmol/L (ref 20–32)
Calcium: 9.6 mg/dL (ref 8.6–10.3)
Chloride: 104 mmol/L (ref 98–110)
Creat: 0.81 mg/dL (ref 0.70–1.18)
Globulin: 2.5 g/dL (calc) (ref 1.9–3.7)
Glucose, Bld: 102 mg/dL — ABNORMAL HIGH (ref 65–99)
Potassium: 4.4 mmol/L (ref 3.5–5.3)
Sodium: 138 mmol/L (ref 135–146)
Total Bilirubin: 0.4 mg/dL (ref 0.2–1.2)
Total Protein: 6.8 g/dL (ref 6.1–8.1)

## 2019-10-22 LAB — CBC WITH DIFFERENTIAL/PLATELET
Absolute Monocytes: 810 cells/uL (ref 200–950)
Basophils Absolute: 53 cells/uL (ref 0–200)
Basophils Relative: 0.6 %
Eosinophils Absolute: 134 cells/uL (ref 15–500)
Eosinophils Relative: 1.5 %
HCT: 41.9 % (ref 38.5–50.0)
Hemoglobin: 14.4 g/dL (ref 13.2–17.1)
Lymphs Abs: 2136 cells/uL (ref 850–3900)
MCH: 31.4 pg (ref 27.0–33.0)
MCHC: 34.4 g/dL (ref 32.0–36.0)
MCV: 91.3 fL (ref 80.0–100.0)
MPV: 9 fL (ref 7.5–12.5)
Monocytes Relative: 9.1 %
Neutro Abs: 5767 cells/uL (ref 1500–7800)
Neutrophils Relative %: 64.8 %
Platelets: 294 10*3/uL (ref 140–400)
RBC: 4.59 10*6/uL (ref 4.20–5.80)
RDW: 12.6 % (ref 11.0–15.0)
Total Lymphocyte: 24 %
WBC: 8.9 10*3/uL (ref 3.8–10.8)

## 2019-10-22 LAB — HEPATITIS B SURFACE ANTIGEN: Hepatitis B Surface Ag: NONREACTIVE

## 2019-10-22 LAB — HEPATITIS B CORE ANTIBODY, TOTAL: Hep B Core Total Ab: NONREACTIVE

## 2019-10-22 LAB — HEPATITIS C ANTIBODY
Hepatitis C Ab: NONREACTIVE
SIGNAL TO CUT-OFF: 0.01 (ref <1.00)

## 2019-10-22 LAB — HEPATITIS B SURFACE ANTIBODY,QUALITATIVE: Hep B S Ab: NONREACTIVE

## 2019-10-22 LAB — SEDIMENTATION RATE: Sed Rate: 2 mm/h (ref 0–20)

## 2019-10-22 LAB — TSH: TSH: 2.29 mIU/L (ref 0.40–4.50)

## 2019-10-22 LAB — C-REACTIVE PROTEIN: CRP: 1.9 mg/L (ref <8.0)

## 2019-10-22 LAB — CYCLIC CITRUL PEPTIDE ANTIBODY, IGG: Cyclic Citrullin Peptide Ab: <16 UNITS

## 2019-10-22 LAB — RHEUMATOID FACTOR: Rheumatoid fact SerPl-aCnc: <14 IU/mL (ref <14)

## 2019-10-23 ENCOUNTER — Telehealth: Payer: Self-pay | Admitting: *Deleted

## 2019-10-23 NOTE — Telephone Encounter (Signed)
Relayed to patient. THank you! Sharyn Lull

## 2019-10-23 NOTE — Telephone Encounter (Signed)
-----   Message from Mignon Pine, DO sent at 10/23/2019  8:58 AM EDT ----- Can you please let patient know that all his labs were normal that I checked.  There was no evidence of viral cause of his previous symptoms.  The autoimmune labs that I added were also negative as were his inflammatory markers.  Thanks!

## 2019-11-07 ENCOUNTER — Other Ambulatory Visit: Payer: Self-pay | Admitting: Family Medicine

## 2019-11-10 ENCOUNTER — Other Ambulatory Visit: Payer: Self-pay

## 2019-11-10 ENCOUNTER — Inpatient Hospital Stay: Payer: Medicare Other | Attending: Oncology | Admitting: Oncology

## 2019-11-10 VITALS — BP 118/73 | HR 88 | Temp 98.1°F | Resp 17 | Ht 68.0 in | Wt 168.9 lb

## 2019-11-10 DIAGNOSIS — C8380 Other non-follicular lymphoma, unspecified site: Secondary | ICD-10-CM | POA: Diagnosis not present

## 2019-11-10 DIAGNOSIS — Z8572 Personal history of non-Hodgkin lymphomas: Secondary | ICD-10-CM | POA: Insufficient documentation

## 2019-11-10 NOTE — Progress Notes (Signed)
  [place] OFFICE PROGRESS NOTE   Diagnosis: Marginal zone lymphoma  INTERVAL HISTORY:   Mr. Comer returns as scheduled.  He was diagnosed with [facility] spotted fever in August.  He reports tick bites followed by development of joint and muscle pain.  He was treated with doxycycline.  The symptoms have improved.  He has received the COVID-19 vaccines.  He feels well at present.  No fever.  Good appetite.  No difficulty with bowel function.  No bleeding.  Objective:  Vital signs in last 24 hours:  Blood pressure 118/73, pulse 88, temperature 98.1 F (36.7 C), temperature source Tympanic, resp. rate 17, height 5\' 8"  (1.727 m), weight 168 lb 14.4 oz (76.6 kg), SpO2 99 %.    Lymphatics: No cervical, supraclavicular, axillary, or inguinal nodes Resp: Good air movement bilaterally, no respiratory distress, scattered end inspiratory coarse rales Cardio: Regular rate and rhythm GI: No mass, nontender, no hepatosplenomegaly Vascular: No leg edema   Lab Results:  Lab Results  Component Value Date   WBC 8.9 10/21/2019   HGB 14.4 10/21/2019   HCT 41.9 10/21/2019   MCV 91.3 10/21/2019   PLT 294 10/21/2019   NEUTROABS 5,767 10/21/2019     Medications: I have reviewed the patient's current medications.   Assessment/Plan: 1. History of intermittent rectal bleeding.  2. 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  1. 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  2. CT of the abdomen and pelvis 08/04/2010 with abdominal/pelvic lymphadenopathy. 3. PET scan 09/13/2010 confirming mildly enlarged abdominal/pelvic lymph nodes with borderline increased FDG activity. 4. Initiation of single agent  rituximab therapy on 11/28/2010, he completed 4 weekly treatments on 12/21/2010 5. 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 6. Cycle 1 bendamustine/rituximab 05/01/2011 7. Cycle 2 bendamustine/rituximab 05/29/2011 8. Cycle 3 bendamustine/rituximab 06/26/2011 9. Restaging PET scan 07/27/2011-no evidence of lymphoma 10. Restaging sigmoidoscopy 10/03/2011-normal colonic mucosa 11. Colonoscopy 05/22/2017- multiple polyps removed, tubular adenomas 3. History of iron deficiency. Ferritin was in the low normal range on 11/14/2010. Hemoglobin remains normal. 4. ? left upper lung nodule on a chest x-ray 69/48/5462-VO hypermetabolic lung nodule was seen on a PET scan 09/13/2010 12.  [facility] spotted fever August 2021     Disposition: Mr. Hiney remains in clinical remission from [place].  He will return for an office visit in 1 year.  He will contact us in the interim as needed.   Betsy Coder, MD  11/10/2019  1:00 PM

## 2019-11-11 ENCOUNTER — Telehealth: Payer: Self-pay | Admitting: Oncology

## 2019-11-11 NOTE — Telephone Encounter (Signed)
Scheduled appointment per 10/26 los. Spoke to patient who is aware of appointment date and time.  

## 2019-11-20 ENCOUNTER — Ambulatory Visit: Payer: Medicare Other | Admitting: Oncology

## 2019-12-07 ENCOUNTER — Other Ambulatory Visit: Payer: Self-pay | Admitting: Family Medicine

## 2020-01-06 ENCOUNTER — Ambulatory Visit: Payer: Medicare Other | Admitting: Family Medicine

## 2020-01-13 ENCOUNTER — Ambulatory Visit: Payer: Medicare Other | Admitting: Family Medicine

## 2020-01-26 ENCOUNTER — Encounter: Payer: Self-pay | Admitting: Family Medicine

## 2020-01-26 ENCOUNTER — Ambulatory Visit (INDEPENDENT_AMBULATORY_CARE_PROVIDER_SITE_OTHER): Payer: Medicare Other | Admitting: Family Medicine

## 2020-01-26 ENCOUNTER — Other Ambulatory Visit: Payer: Self-pay

## 2020-01-26 VITALS — BP 133/85 | HR 77 | Temp 97.9°F | Ht 68.0 in | Wt 176.6 lb

## 2020-01-26 DIAGNOSIS — E782 Mixed hyperlipidemia: Secondary | ICD-10-CM

## 2020-01-26 DIAGNOSIS — C8590 Non-Hodgkin lymphoma, unspecified, unspecified site: Secondary | ICD-10-CM

## 2020-01-26 DIAGNOSIS — A770 Spotted fever due to Rickettsia rickettsii: Secondary | ICD-10-CM | POA: Diagnosis not present

## 2020-01-26 DIAGNOSIS — C8280 Other types of follicular lymphoma, unspecified site: Secondary | ICD-10-CM

## 2020-01-26 DIAGNOSIS — N5201 Erectile dysfunction due to arterial insufficiency: Secondary | ICD-10-CM

## 2020-01-26 DIAGNOSIS — E034 Atrophy of thyroid (acquired): Secondary | ICD-10-CM

## 2020-01-26 NOTE — Progress Notes (Signed)
Subjective:  Patient ID: Brad May, adult    DOB: 03-09-1940  Age: 80 y.o. MRN: [id number]  CC: Medical Management of Chronic Issues   HPI Brad May presents for patient presents for follow-up on  thyroid. The patient has a history of hypothyroidism for many years. It has been stable recently. Pt. denies any change in  voice, loss of hair, heat or cold intolerance. Energy level has been adequate to good. Patient denies constipation and diarrhea. No myxedema. Medication is as noted below. Verified that pt is taking it daily on an empty stomach. Well tolerated. Patient in for follow-up of elevated cholesterol. Doing well without complaints on current medication. Denies side effects of statin including myalgia and arthralgia and nausea. Also in today for liver function testing. Currently no chest pain, shortness of breath or other cardiovascular related symptoms noted. 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  Is oncologist mentions the possibility of getting a colonoscopy.  He is still taking the nabumetone as needed for joint pains.  Patient had a rather serious bout of [facility] spotted fever through the fall.  He has had a lot of discomfort and joint pain etc. from that but is primarily just weak.  He tells me today that the weakness is much better.  He would like to be retested for the status of his [facility] spotted fever.  Depression screen [facility] 2/9 01/26/2020 10/21/2019 10/14/2019  Decreased Interest 0 0 0  Down, Depressed, Hopeless 0 0 0  PHQ - 2 Score 0 0 0    History Brad May has a past medical history of BPH (benign prostatic hyperplasia), Duodenitis, Hyperlipidemia, Non Hodgkin's lymphoma ([place]) (33/29/5188), Periumbilical hernia, and Thyroid disease.   He has a past surgical history that includes Appendectomy; Inguinal hernia repair (2006); Colonoscopy w/ polypectomy; Colonoscopy; and Polypectomy.   His family history includes  COPD in his brother; Dementia in his paternal grandfather; Heart attack (age of onset: 65) in his father; Heart disease in his father and mother; Heart failure in his mother; Suicidality in his daughter.He reports that he has never smoked. He has never used smokeless tobacco. He reports current alcohol use. He reports that he does not use drugs.    ROS Review of Systems  Constitutional: Positive for fatigue.  HENT: Negative.   Eyes: Negative for visual disturbance.  Respiratory: Negative for cough and shortness of breath.   Cardiovascular: Negative for chest pain and leg swelling.  Gastrointestinal: Negative for abdominal pain, diarrhea, nausea and vomiting.  Genitourinary: Negative for difficulty urinating.  Musculoskeletal: Negative for arthralgias and myalgias.  Skin: Negative for rash.  Neurological: Positive for weakness. Negative for headaches.  Psychiatric/Behavioral: Negative for sleep disturbance.    Objective:  BP 133/85   Pulse 77   Temp 97.9 F (36.6 C) (Temporal)   Ht '5\' 8"'  (1.727 m)   Wt 176 lb 9.6 oz (80.1 kg)   BMI 26.85 kg/m   BP Readings from Last 3 Encounters:  01/26/20 133/85  11/10/19 118/73  10/21/19 136/85    Wt Readings from Last 3 Encounters:  01/26/20 176 lb 9.6 oz (80.1 kg)  11/10/19 168 lb 14.4 oz (76.6 kg)  10/21/19 169 lb (76.7 kg)     Physical Exam Vitals reviewed.  Constitutional:      Appearance: He is well-developed and well-nourished.  HENT:     Head: Normocephalic and atraumatic.     Right Ear: Tympanic membrane and external  ear normal. No decreased hearing noted.     Left Ear: Tympanic membrane and external ear normal. No decreased hearing noted.     Mouth/Throat:     Pharynx: No oropharyngeal exudate or posterior oropharyngeal erythema.  Eyes:     Pupils: Pupils are equal, round, and reactive to light.  Cardiovascular:     Rate and Rhythm: Normal rate and regular rhythm.     Heart sounds: No murmur heard.   Pulmonary:      Effort: No respiratory distress.     Breath sounds: Normal breath sounds.  Abdominal:     General: Bowel sounds are normal.     Palpations: Abdomen is soft. There is no mass.     Tenderness: There is no abdominal tenderness.  Musculoskeletal:     Cervical back: Normal range of motion and neck supple.       Assessment & Plan:   Jona was seen today for medical management of chronic issues.  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 I am also having him maintain his aspirin, fish oil-omega-3 fatty acids, b complex vitamins, Cholecalciferol, Probiotic Product (PROBIOTIC-10 PO), atorvastatin, sildenafil, and levothyroxine. We will continue to administer methylPREDNISolone acetate.  Allergies as of 01/26/2020      Reactions   Sulfonamide Derivatives    REACTION: unknown      Medication List       Accurate as of January 26, 2020  5:22 PM. If you have any questions, ask your nurse or doctor.        STOP taking these medications   niacin 500 MG CR tablet Commonly known as: NIASPAN Stopped by: Brad Fraise, MD   tiZANidine 4 MG capsule Commonly known as: Zanaflex Stopped by: Brad Fraise, MD     TAKE these medications    aspirin 81 MG tablet Take 81 mg by mouth daily.   atorvastatin 80 MG tablet Commonly known as: LIPITOR Take 1 tablet (80 mg total) by mouth daily.   b complex vitamins tablet Take 1 tablet by mouth daily.   Cholecalciferol 25 MCG (1000 UT) tablet Take 2,000 Units by mouth daily.   fish oil-omega-3 fatty acids 1000 MG capsule Take 1 g by mouth daily.   levothyroxine 88 MCG tablet Commonly known as: SYNTHROID TAKE 1 TABLET BY MOUTH EVERY DAY   PROBIOTIC-10 PO Take 1 capsule by mouth daily.   sildenafil 20 MG tablet Commonly known as: REVATIO TAKE 2-5 TABLETS BY MOUTH AS NEEDED FOR SEXUAL ACTIVITY        Follow-up:  Return in about 6 months (around 07/25/2020).  Brad May, M.D.

## 2020-01-27 LAB — CMP14+EGFR
ALT: 22 IU/L (ref 0–44)
AST: 16 IU/L (ref 0–40)
Albumin/Globulin Ratio: 2.1 (ref 1.2–2.2)
Albumin: 4.2 g/dL (ref 3.7–4.7)
Alkaline Phosphatase: 130 IU/L — ABNORMAL HIGH (ref 44–121)
BUN/Creatinine Ratio: 13 (ref 10–24)
BUN: 12 mg/dL (ref 8–27)
Bilirubin Total: 0.3 mg/dL (ref 0.0–1.2)
CO2: 22 mmol/L (ref 20–29)
Calcium: 9.4 mg/dL (ref 8.6–10.2)
Chloride: 103 mmol/L (ref 96–106)
Creatinine, Ser: 0.9 mg/dL (ref 0.76–1.27)
GFR calc Af Amer: 93 mL/min/{1.73_m2} (ref >59)
GFR calc non Af Amer: 80 mL/min/{1.73_m2} (ref >59)
Globulin, Total: 2 g/dL (ref 1.5–4.5)
Glucose: 150 mg/dL — ABNORMAL HIGH (ref 65–99)
Potassium: 4.2 mmol/L (ref 3.5–5.2)
Sodium: 138 mmol/L (ref 134–144)
Total Protein: 6.2 g/dL (ref 6.0–8.5)

## 2020-01-27 LAB — ROCKY MTN SPOTTED FVR ABS PNL(IGG+IGM)
RMSF IgG: NEGATIVE
RMSF IgM: 1.19 index — ABNORMAL HIGH (ref 0.00–0.89)

## 2020-01-27 LAB — TSH+FREE T4
Free T4: 1.66 ng/dL (ref 0.82–1.77)
TSH: 0.804 u[IU]/mL (ref 0.450–4.500)

## 2020-01-27 LAB — LIPID PANEL
Chol/HDL Ratio: 4.5 ratio (ref 0.0–5.0)
Cholesterol, Total: 150 mg/dL (ref 100–199)
HDL: 33 mg/dL — ABNORMAL LOW (ref >39)
LDL Chol Calc (NIH): 73 mg/dL (ref 0–99)
Triglycerides: 272 mg/dL — ABNORMAL HIGH (ref 0–149)
VLDL Cholesterol Cal: 44 mg/dL — ABNORMAL HIGH (ref 5–40)

## 2020-01-27 LAB — CBC WITH DIFFERENTIAL/PLATELET
Basophils Absolute: 0.1 10*3/uL (ref 0.0–0.2)
Basos: 1 %
EOS (ABSOLUTE): 0.2 10*3/uL (ref 0.0–0.4)
Eos: 3 %
Hematocrit: 44 % (ref 37.5–51.0)
Hemoglobin: 15.1 g/dL (ref 13.0–17.7)
Immature Grans (Abs): 0 10*3/uL (ref 0.0–0.1)
Immature Granulocytes: 0 %
Lymphocytes Absolute: 1.8 10*3/uL (ref 0.7–3.1)
Lymphs: 28 %
MCH: 31.7 pg (ref 26.6–33.0)
MCHC: 34.3 g/dL (ref 31.5–35.7)
MCV: 92 fL (ref 79–97)
Monocytes Absolute: 0.7 10*3/uL (ref 0.1–0.9)
Monocytes: 11 %
Neutrophils Absolute: 3.6 10*3/uL (ref 1.4–7.0)
Neutrophils: 57 %
Platelets: 203 10*3/uL (ref 150–450)
RBC: 4.77 x10E6/uL (ref 4.14–5.80)
RDW: 12.5 % (ref 11.6–15.4)
WBC: 6.3 10*3/uL (ref 3.4–10.8)

## 2020-01-28 ENCOUNTER — Telehealth: Payer: Self-pay

## 2020-01-30 ENCOUNTER — Other Ambulatory Visit: Payer: Self-pay | Admitting: Family Medicine

## 2020-04-12 DIAGNOSIS — H52223 Regular astigmatism, bilateral: Secondary | ICD-10-CM | POA: Diagnosis not present

## 2020-04-12 DIAGNOSIS — H43813 Vitreous degeneration, bilateral: Secondary | ICD-10-CM | POA: Diagnosis not present

## 2020-04-12 DIAGNOSIS — H25813 Combined forms of age-related cataract, bilateral: Secondary | ICD-10-CM | POA: Diagnosis not present

## 2020-04-12 DIAGNOSIS — H524 Presbyopia: Secondary | ICD-10-CM | POA: Diagnosis not present

## 2020-04-12 DIAGNOSIS — H5213 Myopia, bilateral: Secondary | ICD-10-CM | POA: Diagnosis not present

## 2020-07-25 ENCOUNTER — Ambulatory Visit (INDEPENDENT_AMBULATORY_CARE_PROVIDER_SITE_OTHER): Payer: Medicare Other

## 2020-07-25 VITALS — Ht 68.0 in | Wt 170.0 lb

## 2020-07-25 DIAGNOSIS — Z0000 Encounter for general adult medical examination without abnormal findings: Secondary | ICD-10-CM

## 2020-07-25 NOTE — Patient Instructions (Signed)
Brad May , Thank you for taking time to come for your Medicare Wellness Visit. I appreciate your ongoing commitment to your health goals. Please review the following plan we discussed and let me know if I can assist you in the future.   Screening recommendations/referrals: Colonoscopy: Done 05/28/2017 - Repeat not required Recommended yearly ophthalmology/optometry visit for glaucoma screening and checkup Recommended yearly dental visit for hygiene and checkup  Vaccinations: Influenza vaccine: Done 11/02/2019 - Repeat annually Pneumococcal vaccine: Done 12/17/2012 & 11/20/2018 Tdap vaccine: Done 11/15/2011 - Repeat in 10 years  Shingles vaccine: Zostavax done 07/10/2006; DUE for Shingrix discussed. Please contact your pharmacy for coverage information.     Covid-19: Done 11/08/19, 06/09/20 - 2 other doses - we need dates please  Advanced directives: Please bring a copy of your health care power of attorney and living will to the office to be added to your chart at your convenience.   Conditions/risks identified: Aim for 30 minutes of exercise or brisk walking each day, drink 6-8 glasses of water and eat lots of fruits and vegetables.   Next appointment: Follow up in one year for your annual wellness visit.   Preventive Care 31 Years and Older, Male  Preventive care refers to lifestyle choices and visits with your health care provider that can promote health and wellness. What does preventive care include? A yearly physical exam. This is also called an annual well check. Dental exams once or twice a year. Routine eye exams. Ask your health care provider how often you should have your eyes checked. Personal lifestyle choices, including: Daily care of your teeth and gums. Regular physical activity. Eating a healthy diet. Avoiding tobacco and drug use. Limiting alcohol use. Practicing safe sex. Taking low doses of aspirin every day. Taking vitamin and mineral supplements as recommended  by your health care provider. What happens during an annual well check? The services and screenings done by your health care provider during your annual well check will depend on your age, overall health, lifestyle risk factors, and family history of disease. Counseling  Your health care provider may ask you questions about your: Alcohol use. Tobacco use. Drug use. Emotional well-being. Home and relationship well-being. Sexual activity. Eating habits. History of falls. Memory and ability to understand (cognition). Work and work Statistician. Screening  You may have the following tests or measurements: Height, weight, and BMI. Blood pressure. Lipid and cholesterol levels. These may be checked every 5 years, or more frequently if you are over 44 years old. Skin check. Lung cancer screening. You may have this screening every year starting at age 66 if you have a 30-pack-year history of smoking and currently smoke or have quit within the past 15 years. Fecal occult blood test (FOBT) of the stool. You may have this test every year starting at age 22. Flexible sigmoidoscopy or colonoscopy. You may have a sigmoidoscopy every 5 years or a colonoscopy every 10 years starting at age 57. Prostate cancer screening. Recommendations will vary depending on your family history and other risks. Hepatitis C blood test. Hepatitis B blood test. Sexually transmitted disease (STD) testing. Diabetes screening. This is done by checking your blood sugar (glucose) after you have not eaten for a while (fasting). You may have this done every 1-3 years. Abdominal aortic aneurysm (AAA) screening. You may need this if you are a current or former smoker. Osteoporosis. You may be screened starting at age 32 if you are at high risk. Talk with your health care  provider about your test results, treatment options, and if necessary, the need for more tests. Vaccines  Your health care provider may recommend certain  vaccines, such as: Influenza vaccine. This is recommended every year. Tetanus, diphtheria, and acellular pertussis (Tdap, Td) vaccine. You may need a Td booster every 10 years. Zoster vaccine. You may need this after age 84. Pneumococcal 13-valent conjugate (PCV13) vaccine. One dose is recommended after age 2. Pneumococcal polysaccharide (PPSV23) vaccine. One dose is recommended after age 35. Talk to your health care provider about which screenings and vaccines you need and how often you need them. This information is not intended to replace advice given to you by your health care provider. Make sure you discuss any questions you have with your health care provider. Document Released: 01/28/2015 Document Revised: 09/21/2015 Document Reviewed: 11/02/2014 Elsevier Interactive Patient Education  2017 [place] Prevention in the Home Falls can cause injuries. They can happen to people of all ages. There are many things you can do to make your home safe and to help prevent falls. What can I do on the outside of my home? Regularly fix the edges of walkways and driveways and fix any cracks. Remove anything that might make you trip as you walk through a door, such as a raised step or threshold. Trim any bushes or trees on the path to your home. Use bright outdoor lighting. Clear any walking paths of anything that might make someone trip, such as rocks or tools. Regularly check to see if handrails are loose or broken. Make sure that both sides of any steps have handrails. Any raised decks and porches should have guardrails on the edges. Have any leaves, snow, or ice cleared regularly. Use sand or salt on walking paths during winter. Clean up any spills in your garage right away. This includes oil or grease spills. What can I do in the bathroom? Use night lights. Install grab bars by the toilet and in the tub and shower. Do not use towel bars as grab bars. Use non-skid mats or decals in  the tub or shower. If you need to sit down in the shower, use a plastic, non-slip stool. Keep the floor dry. Clean up any water that spills on the floor as soon as it happens. Remove soap buildup in the tub or shower regularly. Attach bath mats securely with double-sided non-slip rug tape. Do not have throw rugs and other things on the floor that can make you trip. What can I do in the bedroom? Use night lights. Make sure that you have a light by your bed that is easy to reach. Do not use any sheets or blankets that are too big for your bed. They should not hang down onto the floor. Have a firm chair that has side arms. You can use this for support while you get dressed. Do not have throw rugs and other things on the floor that can make you trip. What can I do in the kitchen? Clean up any spills right away. Avoid walking on wet floors. Keep items that you use a lot in easy-to-reach places. If you need to reach something above you, use a strong step stool that has a grab bar. Keep electrical cords out of the way. Do not use floor polish or wax that makes floors slippery. If you must use wax, use non-skid floor wax. Do not have throw rugs and other things on the floor that can make you trip. What can I  do with my stairs? Do not leave any items on the stairs. Make sure that there are handrails on both sides of the stairs and use them. Fix handrails that are broken or loose. Make sure that handrails are as long as the stairways. Check any carpeting to make sure that it is firmly attached to the stairs. Fix any carpet that is loose or worn. Avoid having throw rugs at the top or bottom of the stairs. If you do have throw rugs, attach them to the floor with carpet tape. Make sure that you have a light switch at the top of the stairs and the bottom of the stairs. If you do not have them, ask someone to add them for you. What else can I do to help prevent falls? Wear shoes that: Do not have high  heels. Have rubber bottoms. Are comfortable and fit you well. Are closed at the toe. Do not wear sandals. If you use a stepladder: Make sure that it is fully opened. Do not climb a closed stepladder. Make sure that both sides of the stepladder are locked into place. Ask someone to hold it for you, if possible. Clearly mark and make sure that you can see: Any grab bars or handrails. First and last steps. Where the edge of each step is. Use tools that help you move around (mobility aids) if they are needed. These include: Canes. Walkers. Scooters. Crutches. Turn on the lights when you go into a dark area. Replace any light bulbs as soon as they burn out. Set up your furniture so you have a clear path. Avoid moving your furniture around. If any of your floors are uneven, fix them. If there are any pets around you, be aware of where they are. Review your medicines with your doctor. Some medicines can make you feel dizzy. This can increase your chance of falling. Ask your doctor what other things that you can do to help prevent falls. This information is not intended to replace advice given to you by your health care provider. Make sure you discuss any questions you have with your health care provider. Document Released: 10/28/2008 Document Revised: 06/09/2015 Document Reviewed: 02/05/2014 Elsevier Interactive Patient Education  2017 [employer].

## 2020-07-25 NOTE — Progress Notes (Signed)
Subjective:   PIETER FOOKS is a 80 y.o. male who presents for Medicare Annual/Subsequent preventive examination.  Virtual Visit via Telephone Note  I connected with  Phat Dalton Hitchman on 07/25/20 at  1:15 PM EDT by telephone and verified that I am speaking with the correct person using two identifiers.  Location: Patient: Home Provider: [facility] Persons participating in the virtual visit: patient/Nurse Health Advisor   I discussed the limitations, risks, security and privacy concerns of performing an evaluation and management service by telephone and the availability of in person appointments. The patient expressed understanding and agreed to proceed.  Interactive audio and video telecommunications were attempted between this nurse and patient, however failed, due to patient having technical difficulties OR patient did not have access to video capability.  We continued and completed visit with audio only.  Some vital signs may be absent or patient reported.   Lateria Alderman E Beren Yniguez, LPN   Review of Systems     Cardiac Risk Factors include: advanced age (>41men, >75 women);dyslipidemia;male gender     Objective:    Today's Vitals   07/25/20 1321  Weight: 170 lb (77.1 kg)  Height: 5\' 8"  (1.727 m)   Body mass index is 25.85 kg/m.  Advanced Directives 07/25/2020 06/30/2018 11/18/2017 06/03/2017 05/25/2016 02/09/2016 05/11/2015  Does Patient Have a Medical Advance Directive? Yes No No;Yes Yes Yes Yes Yes  Type of Paramedic of [place];Living will - [place];Living will Living will Living will Living will [place];Living will  Does patient want to make changes to medical advance directive? - - No - Patient declined - - - -  Copy of [place] in Chart? No - copy requested - No - copy requested - - - No - copy requested  Would patient like information on creating a medical advance directive? - Yes  (MAU/Ambulatory/Procedural Areas - Information given) - - - - -    Current Medications (verified) Outpatient Encounter Medications as of 07/25/2020  Medication Sig   aspirin 81 MG tablet Take 81 mg by mouth daily.   atorvastatin (LIPITOR) 80 MG tablet TAKE 1 TABLET BY MOUTH EVERY DAY   b complex vitamins tablet Take 1 tablet by mouth daily.   Cholecalciferol 25 MCG (1000 UT) tablet Take 2,000 Units by mouth daily.    fish oil-omega-3 fatty acids 1000 MG capsule Take 1 g by mouth daily.   levothyroxine (SYNTHROID) 88 MCG tablet TAKE 1 TABLET BY MOUTH EVERY DAY   Probiotic Product (PROBIOTIC-10 PO) Take 1 capsule by mouth daily.    sildenafil (REVATIO) 20 MG tablet TAKE 2-5 TABLETS BY MOUTH AS NEEDED FOR SEXUAL ACTIVITY   Facility-Administered Encounter Medications as of 07/25/2020  Medication   methylPREDNISolone acetate (DEPO-MEDROL) injection 80 mg    Allergies (verified) Sulfonamide derivatives   History: Past Medical History:  Diagnosis Date   BPH (benign prostatic hyperplasia)    Duodenitis    Hyperlipidemia    Non Hodgkin's lymphoma ([place]) 11/11/2534   Periumbilical hernia    Thyroid disease    Past Surgical History:  Procedure Laterality Date   APPENDECTOMY     COLONOSCOPY     COLONOSCOPY W/ POLYPECTOMY     INGUINAL HERNIA REPAIR  2006   left   POLYPECTOMY     Family History  Problem Relation Age of Onset   Heart disease Mother    Heart failure Mother        age 51  Heart disease Father    Heart attack Father 7   COPD Brother    Suicidality Daughter    Dementia Paternal Grandfather    Colon cancer Neg Hx    Stomach cancer Neg Hx    Esophageal cancer Neg Hx    Liver cancer Neg Hx    Pancreatic cancer Neg Hx    Prostate cancer Neg Hx    Rectal cancer Neg Hx    Ulcerative colitis Neg Hx    Social History   Socioeconomic History   Marital status: Married    Spouse name: Orlando Penner   Number of children: 2   Years of education: Not on file   Highest  education level: Doctorate  Occupational History   Occupation: High school Principal    Comment: retired  Tobacco Use   Smoking status: Never   Smokeless tobacco: Never  Vaping Use   Vaping Use: Never used  Substance and Sexual Activity   Alcohol use: Yes    Comment: 1-2 month   Drug use: No   Sexual activity: Yes  Other Topics Concern   Not on file  Social History Narrative   Lives at home with wife.  Two children.  Daughter died suicide.  Two grands.  Retired Tourist information centre manager.     Son and grandson live close by.   Social Determinants of Health   Financial Resource Strain: Low Risk    Difficulty of Paying Living Expenses: Not hard at all  Food Insecurity: No Food Insecurity   Worried About Charity fundraiser in the Last Year: Never true   [place] in the Last Year: Never true  Transportation Needs: No Transportation Needs   Lack of Transportation (Medical): No   Lack of Transportation (Non-Medical): No  Physical Activity: Sufficiently Active   Days of Exercise per Week: 5 days   Minutes of Exercise per Session: 30 min  Stress: No Stress Concern Present   Feeling of Stress : Not at all  Social Connections: Socially Integrated   Frequency of Communication with Friends and Family: More than three times a week   Frequency of Social Gatherings with Friends and Family: More than three times a week   Attends Religious Services: More than 4 times per year   Active Member of [employer] or Organizations: Yes   Attends Music therapist: More than 4 times per year   Marital Status: Married    Tobacco Counseling Counseling given: Not Answered   Clinical Intake:  Pre-visit preparation completed: Yes  Pain : No/denies pain     BMI - recorded: 25.85 Nutritional Status: BMI 25 -29 Overweight Nutritional Risks: None Diabetes: No  How often do you need to have someone help you when you read instructions, pamphlets, or other written materials from your doctor or  pharmacy?: 1 - Never  Diabetic? No  Interpreter Needed?: No  Information entered by :: Jersie Beel, LPN   Activities of Daily Living In your present state of health, do you have any difficulty performing the following activities: 07/25/2020  Hearing? N  Vision? N  Difficulty concentrating or making decisions? N  Walking or climbing stairs? N  Dressing or bathing? N  Doing errands, shopping? N  Preparing Food and eating ? N  Using the Toilet? N  In the past six months, have you accidently leaked urine? N  Do you have problems with loss of bowel control? N  Managing your Medications? N  Managing your Finances? N  Housekeeping  or managing your Housekeeping? N  Some recent data might be hidden    Patient Care Team: Claretta Fraise, MD as PCP - General (Family Medicine) Pyrtle, [name], MD as Consulting Physician (Gastroenterology) [name], MD as Consulting Physician (Oncology)  Indicate any recent Medical Services you may have received from other than [facility] providers in the past year (date may be approximate).     Assessment:   This is a routine wellness examination for Emeka.  Hearing/Vision screen Hearing Screening - Comments:: Denies hearing difficulties  Vision Screening - Comments:: Wears eyeglasses - up to date with annual eye exam with Dr Lind Covert in [place]  Dietary issues and exercise activities discussed: Current Exercise Habits: Home exercise routine, Type of exercise: walking;strength training/weights, Time (Minutes): 45, Frequency (Times/Week): 4, Weekly Exercise (Minutes/Week): 180, Intensity: Moderate, Exercise limited by: orthopedic condition(s)   Goals Addressed             This Visit's Progress    Exercise 150 min/wk Moderate Activity   On track    Increase physical activity   On track    Would like to start running again- 5K        Depression Screen PHQ 2/9 Scores 07/25/2020 01/26/2020 10/21/2019 10/14/2019 09/01/2019 07/07/2019  01/06/2019  PHQ - 2 Score 0 0 0 0 0 0 0    Fall Risk Fall Risk  07/25/2020 01/26/2020 10/21/2019 10/14/2019 09/01/2019  Falls in the past year? 0 0 0 0 0  Number falls in past yr: 0 0 - - -  Injury with Fall? 0 0 - - -  Risk for fall due to : Orthopedic patient;Impaired vision No Fall Risks No Fall Risks - -  Follow up Falls prevention discussed - Falls evaluation completed Falls evaluation completed Falls evaluation completed    FALL RISK PREVENTION PERTAINING TO THE HOME:  Any stairs in or around the home? Yes  If so, are there any without handrails? No  Home free of loose throw rugs in walkways, pet beds, electrical cords, etc? Yes  Adequate lighting in your home to reduce risk of falls? Yes   ASSISTIVE DEVICES UTILIZED TO PREVENT FALLS:  Life alert? No  Use of a cane, walker or w/c? No  Grab bars in the bathroom? No  Shower chair or bench in shower? Yes  Elevated toilet seat or a handicapped toilet? No   TIMED UP AND GO:  Was the test performed? No . Telephonic visit  Cognitive Function: Normal cognitive status assessed by direct observation by this Nurse Health Advisor. No abnormalities found.   MMSE - Mini Mental State Exam 06/03/2017 05/25/2016 10/21/2014  Orientation to time 5 5 4   Orientation to Place 5 5 5   Registration 3 3 3   Attention/ Calculation 4 5 5   Recall 3 3 3   Language- name 2 objects 2 2 2   Language- repeat 1 1 1   Language- follow 3 step command 3 3 3   Language- read & follow direction 1 1 1   Write a sentence 1 1 1   Copy design 1 1 1   Total score 29 30 29      6CIT Screen 06/30/2018  What Year? 0 points  What month? 0 points  What time? 0 points  Count back from 20 0 points  Months in reverse 0 points  Repeat phrase 0 points  Total Score 0    Immunizations Immunization History  Administered Date(s) Administered   Fluad Quad(high Dose 65+) 10/06/2018   Influenza, High Dose Seasonal PF  11/09/2016, 11/18/2017, 11/02/2019   Influenza,inj,Quad PF,6+  Mos 10/21/2013, 10/21/2014, 11/11/2015   Influenza-Unspecified 10/06/2018   Moderna Sars-Covid-2 Vaccination 11/08/2019, 06/09/2020   Pneumococcal Conjugate-13 12/17/2012   Pneumococcal Polysaccharide-23 11/15/2004, 11/20/2018   Tdap 11/15/2011   Zoster, Live 07/10/2006    TDAP status: Up to date  Flu Vaccine status: Up to date  Pneumococcal vaccine status: Up to date  Covid-19 vaccine status: Completed vaccines  Qualifies for Shingles Vaccine? Yes   Zostavax completed Yes   Shingrix Completed?: No.    Education has been provided regarding the importance of this vaccine. Patient has been advised to call insurance company to determine out of pocket expense if they have not yet received this vaccine. Advised may also receive vaccine at local pharmacy or Health Dept. Verbalized acceptance and understanding.  Screening Tests Health Maintenance  Topic Date Due   Zoster Vaccines- Shingrix (1 of 2) Never done   COVID-19 Vaccine (3 - Moderna risk series) 07/07/2020   INFLUENZA VACCINE  08/15/2020   TETANUS/TDAP  11/14/2021   COLONOSCOPY (Pts 45-93yrs Insurance coverage will need to be confirmed)  05/29/2022   PNA vac Low Risk Adult  Completed   HPV VACCINES  Aged Out    Health Maintenance  Health Maintenance Due  Topic Date Due   Zoster Vaccines- Shingrix (1 of 2) Never done   COVID-19 Vaccine (3 - Moderna risk series) 07/07/2020    Colorectal cancer screening: No longer required.   Lung Cancer Screening: (Low Dose CT Chest recommended if Age 90-80 years, 30 pack-year currently smoking OR have quit w/in 15years.) does not qualify.   Additional Screening:  Hepatitis C Screening: does not qualify; Completed 10/21/2019  Vision Screening: Recommended annual ophthalmology exams for early detection of glaucoma and other disorders of the eye. Is the patient up to date with their annual eye exam?  Yes  Who is the provider or what is the name of the office in which the patient  attends annual eye exams? Dr Lind Covert in [place] If pt is not established with a provider, would they like to be referred to a provider to establish care? No .   Dental Screening: Recommended annual dental exams for proper oral hygiene  Community Resource Referral / Chronic Care Management: CRR required this visit?  No   CCM required this visit?  No      Plan:     I have personally reviewed and noted the following in the patient's chart:   Medical and social history Use of alcohol, tobacco or illicit drugs  Current medications and supplements including opioid prescriptions. Patient is not currently taking opioid prescriptions. Functional ability and status Nutritional status Physical activity Advanced directives List of other physicians Hospitalizations, surgeries, and ER visits in previous 12 months Vitals Screenings to include cognitive, depression, and falls Referrals and appointments  In addition, I have reviewed and discussed with patient certain preventive protocols, quality metrics, and best practice recommendations. A written personalized care plan for preventive services as well as general preventive health recommendations were provided to patient.     Sandrea Hammond, LPN   1/85/6314   Nurse Notes: None

## 2020-07-26 ENCOUNTER — Ambulatory Visit (INDEPENDENT_AMBULATORY_CARE_PROVIDER_SITE_OTHER): Payer: Medicare Other | Admitting: Family Medicine

## 2020-07-26 ENCOUNTER — Other Ambulatory Visit: Payer: Self-pay

## 2020-07-26 ENCOUNTER — Encounter: Payer: Self-pay | Admitting: Family Medicine

## 2020-07-26 VITALS — BP 112/70 | HR 70 | Temp 97.7°F | Ht 68.0 in | Wt 170.8 lb

## 2020-07-26 DIAGNOSIS — E782 Mixed hyperlipidemia: Secondary | ICD-10-CM | POA: Diagnosis not present

## 2020-07-26 DIAGNOSIS — E559 Vitamin D deficiency, unspecified: Secondary | ICD-10-CM | POA: Diagnosis not present

## 2020-07-26 DIAGNOSIS — Z125 Encounter for screening for malignant neoplasm of prostate: Secondary | ICD-10-CM | POA: Diagnosis not present

## 2020-07-26 DIAGNOSIS — R030 Elevated blood-pressure reading, without diagnosis of hypertension: Secondary | ICD-10-CM

## 2020-07-26 DIAGNOSIS — E034 Atrophy of thyroid (acquired): Secondary | ICD-10-CM

## 2020-07-26 DIAGNOSIS — N400 Enlarged prostate without lower urinary tract symptoms: Secondary | ICD-10-CM

## 2020-07-26 MED ORDER — LEVOTHYROXINE SODIUM 88 MCG PO TABS: 88.0000 ug | ORAL_TABLET | Freq: Every day | ORAL | 3 refills | Status: DC

## 2020-07-26 MED ORDER — ATORVASTATIN CALCIUM 80 MG PO TABS: 80.0000 mg | ORAL_TABLET | Freq: Every day | ORAL | 3 refills | Status: DC

## 2020-07-26 NOTE — Progress Notes (Signed)
Subjective:  Patient ID: Brad May, male    DOB: 03-16-1940  Age: 80 y.o. MRN: [id number]  CC: Medical Management of Chronic Issues   HPI SEVERO BEBER presents for follow-up of elevated cholesterol. Doing well without complaints on current medication. Denies side effects of statin including myalgia and arthralgia and nausea. Also in today for liver function testing. Currently no chest pain, shortness of breath or other cardiovascular related symptoms noted.   follow-up on  thyroid. The patient has a history of hypothyroidism for many years. It has been stable recently. Pt. denies any change in  voice, loss of hair, heat or cold intolerance. Energy level has been adequate to good. Patient denies constipation and diarrhea. No myxedema. Medication is as noted below. Verified that pt is taking it daily on an empty stomach. Well tolerated.   History Thoms has a past medical history of BPH (benign prostatic hyperplasia), Duodenitis, Hyperlipidemia, Non Hodgkin's lymphoma ([place]) (67/67/2094), Periumbilical hernia, and Thyroid disease.   He has a past surgical history that includes Appendectomy; Inguinal hernia repair (2006); Colonoscopy w/ polypectomy; Colonoscopy; and Polypectomy.   His family history includes COPD in his brother; Dementia in his paternal grandfather; Heart attack (age of onset: 53) in his father; Heart disease in his father and mother; Heart failure in his mother; Suicidality in his daughter.He reports that he has never smoked. He has never used smokeless tobacco. He reports current alcohol use. He reports that he does not use drugs.  Current Outpatient Medications on File Prior to Visit  Medication Sig Dispense Refill   aspirin 81 MG tablet Take 81 mg by mouth daily.     b complex vitamins tablet Take 1 tablet by mouth daily.     Cholecalciferol 25 MCG (1000 UT) tablet Take 2,000 Units by mouth daily.      fish oil-omega-3 fatty acids 1000 MG capsule Take 1 g by  mouth daily.     Probiotic Product (PROBIOTIC-10 PO) Take 1 capsule by mouth daily.      sildenafil (REVATIO) 20 MG tablet TAKE 2-5 TABLETS BY MOUTH AS NEEDED FOR SEXUAL ACTIVITY 50 tablet 5   Current Facility-Administered Medications on File Prior to Visit  Medication Dose Route Frequency Provider Last Rate Last Admin   methylPREDNISolone acetate (DEPO-MEDROL) injection 80 mg  80 mg Intramuscular Once Gwenlyn Perking, FNP        ROS Review of Systems  Constitutional:  Negative for fever.  Respiratory:  Negative for shortness of breath.   Cardiovascular:  Negative for chest pain.  Musculoskeletal:  Negative for arthralgias.  Skin:  Negative for rash.   Objective:  BP 112/70   Pulse 70   Temp 97.7 F (36.5 C)   Ht 5' 8" (1.727 m)   Wt 170 lb 12.8 oz (77.5 kg)   SpO2 97%   BMI 25.97 kg/m   BP Readings from Last 3 Encounters:  07/26/20 112/70  01/26/20 133/85  11/10/19 118/73    Wt Readings from Last 3 Encounters:  07/26/20 170 lb 12.8 oz (77.5 kg)  07/25/20 170 lb (77.1 kg)  01/26/20 176 lb 9.6 oz (80.1 kg)     Physical Exam Vitals reviewed.  Constitutional:      Appearance: He is well-developed.  HENT:     Head: Normocephalic and atraumatic.     Right Ear: External ear normal.     Left Ear: External ear normal.     Mouth/Throat:     Pharynx: No oropharyngeal exudate  or posterior oropharyngeal erythema.  Eyes:     Pupils: Pupils are equal, round, and reactive to light.  Cardiovascular:     Rate and Rhythm: Normal rate and regular rhythm.     Heart sounds: No murmur heard. Pulmonary:     Effort: No respiratory distress.     Breath sounds: Normal breath sounds.  Musculoskeletal:     Cervical back: Normal range of motion and neck supple.  Neurological:     Mental Status: He is alert and oriented to person, place, and time.    Lab Results  Component Value Date   HGBA1C 5.9 07/25/2018   HGBA1C 5.6 10/21/2014    Lab Results  Component Value Date    WBC 6.2 07/26/2020   HGB 16.2 07/26/2020   HCT 46.0 07/26/2020   PLT 218 07/26/2020   GLUCOSE 102 (H) 07/26/2020   CHOL 152 07/26/2020   TRIG 101 07/26/2020   HDL 38 (L) 07/26/2020   LDLCALC 95 07/26/2020   ALT 25 07/26/2020   AST 22 07/26/2020   NA 139 07/26/2020   K 4.7 07/26/2020   CL 103 07/26/2020   CREATININE 1.01 07/26/2020   BUN 12 07/26/2020   CO2 21 07/26/2020   TSH 1.700 07/26/2020   PSA 2.8 01/26/2014   HGBA1C 5.9 07/25/2018    EXERCISE TOLERANCE TEST (ETT)  Result Date: 09/03/2018  There was no ST segment deviation noted during stress.  Exercise time 7 minutes and 29 seconds, good overall tolerance. 9.2 METS of activity. Normal blood pressure response.  No electrocardiographic evidence of ischemia. Low risk exercise treadmill test.  Candee Furbish, MD   Assessment & Plan:   Markice was seen today for medical management of chronic issues.  Diagnoses and all orders for this visit:  Hypothyroidism due to acquired atrophy of thyroid -     TSH + free T4 -     levothyroxine (SYNTHROID) 88 MCG tablet; Take 1 tablet (88 mcg total) by mouth daily.  Mixed hyperlipidemia -     Lipid panel -     atorvastatin (LIPITOR) 80 MG tablet; Take 1 tablet (80 mg total) by mouth daily.  Vitamin D deficiency -     VITAMIN D 25 Hydroxy (Vit-D Deficiency, Fractures)  Elevated blood-pressure reading, without diagnosis of hypertension -     CBC with Differential/Platelet -     CMP14+EGFR  Benign prostatic hyperplasia, unspecified whether lower urinary tract symptoms present -     PSA, total and free  Screening for prostate cancer -     PSA, total and free  I have changed Broadus John A. Camberos "[name]"'s atorvastatin and levothyroxine. I am also having him maintain his aspirin, fish oil-omega-3 fatty acids, b complex vitamins, Cholecalciferol, Probiotic Product (PROBIOTIC-10 PO), and sildenafil. We will continue to administer methylPREDNISolone acetate.  Meds ordered this encounter   Medications   atorvastatin (LIPITOR) 80 MG tablet    Sig: Take 1 tablet (80 mg total) by mouth daily.    Dispense:  90 tablet    Refill:  3   levothyroxine (SYNTHROID) 88 MCG tablet    Sig: Take 1 tablet (88 mcg total) by mouth daily.    Dispense:  90 tablet    Refill:  3     Follow-up: Return in about 6 months (around 01/26/2021) for Hypothyroidism, cholesterol.  Claretta Fraise, M.D.

## 2020-07-27 ENCOUNTER — Encounter: Payer: Self-pay | Admitting: Family Medicine

## 2020-07-27 LAB — PSA, TOTAL AND FREE
PSA, Free Pct: 20.3 %
PSA, Free: 0.59 ng/mL
Prostate Specific Ag, Serum: 2.9 ng/mL (ref 0.0–4.0)

## 2020-07-27 LAB — CBC WITH DIFFERENTIAL/PLATELET
Basophils Absolute: 0.1 10*3/uL (ref 0.0–0.2)
Basos: 1 %
EOS (ABSOLUTE): 0.3 10*3/uL (ref 0.0–0.4)
Eos: 5 %
Hematocrit: 46 % (ref 37.5–51.0)
Hemoglobin: 16.2 g/dL (ref 13.0–17.7)
Immature Grans (Abs): 0.1 10*3/uL (ref 0.0–0.1)
Immature Granulocytes: 1 %
Lymphocytes Absolute: 1.9 10*3/uL (ref 0.7–3.1)
Lymphs: 30 %
MCH: 32.1 pg (ref 26.6–33.0)
MCHC: 35.2 g/dL (ref 31.5–35.7)
MCV: 91 fL (ref 79–97)
Monocytes Absolute: 0.7 10*3/uL (ref 0.1–0.9)
Monocytes: 11 %
Neutrophils Absolute: 3.2 10*3/uL (ref 1.4–7.0)
Neutrophils: 52 %
Platelets: 218 10*3/uL (ref 150–450)
RBC: 5.04 x10E6/uL (ref 4.14–5.80)
RDW: 12.8 % (ref 11.6–15.4)
WBC: 6.2 10*3/uL (ref 3.4–10.8)

## 2020-07-27 LAB — CMP14+EGFR
ALT: 25 IU/L (ref 0–44)
AST: 22 IU/L (ref 0–40)
Albumin/Globulin Ratio: 2 (ref 1.2–2.2)
Albumin: 4.3 g/dL (ref 3.7–4.7)
Alkaline Phosphatase: 138 IU/L — ABNORMAL HIGH (ref 44–121)
BUN/Creatinine Ratio: 12 (ref 10–24)
BUN: 12 mg/dL (ref 8–27)
Bilirubin Total: 0.8 mg/dL (ref 0.0–1.2)
CO2: 21 mmol/L (ref 20–29)
Calcium: 9.7 mg/dL (ref 8.6–10.2)
Chloride: 103 mmol/L (ref 96–106)
Creatinine, Ser: 1.01 mg/dL (ref 0.76–1.27)
Globulin, Total: 2.2 g/dL (ref 1.5–4.5)
Glucose: 102 mg/dL — ABNORMAL HIGH (ref 65–99)
Potassium: 4.7 mmol/L (ref 3.5–5.2)
Sodium: 139 mmol/L (ref 134–144)
Total Protein: 6.5 g/dL (ref 6.0–8.5)
eGFR: 75 mL/min/{1.73_m2} (ref >59)

## 2020-07-27 LAB — LIPID PANEL
Chol/HDL Ratio: 4 ratio (ref 0.0–5.0)
Cholesterol, Total: 152 mg/dL (ref 100–199)
HDL: 38 mg/dL — ABNORMAL LOW (ref >39)
LDL Chol Calc (NIH): 95 mg/dL (ref 0–99)
Triglycerides: 101 mg/dL (ref 0–149)
VLDL Cholesterol Cal: 19 mg/dL (ref 5–40)

## 2020-07-27 LAB — TSH+FREE T4
Free T4: 1.45 ng/dL (ref 0.82–1.77)
TSH: 1.7 u[IU]/mL (ref 0.450–4.500)

## 2020-07-27 LAB — VITAMIN D 25 HYDROXY (VIT D DEFICIENCY, FRACTURES): Vit D, 25-Hydroxy: 43.3 ng/mL (ref 30.0–100.0)

## 2020-07-27 NOTE — Progress Notes (Signed)
Hello Brad May,  Your lab result is normal and/or stable.Some minor variations that are not significant are commonly marked abnormal, but do not represent any medical problem for you.  Best regards, Nolawi Kanady, M.D.

## 2020-09-04 ENCOUNTER — Encounter: Payer: Self-pay | Admitting: Internal Medicine

## 2020-11-09 ENCOUNTER — Ambulatory Visit: Payer: Medicare Other | Admitting: Nurse Practitioner

## 2020-11-09 ENCOUNTER — Inpatient Hospital Stay: Payer: Medicare Other | Admitting: Oncology

## 2020-11-09 DIAGNOSIS — R059 Cough, unspecified: Secondary | ICD-10-CM | POA: Diagnosis not present

## 2020-11-09 DIAGNOSIS — J09X2 Influenza due to identified novel influenza A virus with other respiratory manifestations: Secondary | ICD-10-CM | POA: Diagnosis not present

## 2020-11-16 DIAGNOSIS — R059 Cough, unspecified: Secondary | ICD-10-CM | POA: Diagnosis not present

## 2020-11-22 ENCOUNTER — Other Ambulatory Visit: Payer: Self-pay

## 2020-11-22 ENCOUNTER — Inpatient Hospital Stay: Payer: Medicare Other | Attending: Nurse Practitioner | Admitting: Oncology

## 2020-11-22 VITALS — BP 135/81 | HR 79 | Temp 97.8°F | Resp 20 | Ht 68.0 in | Wt 167.5 lb

## 2020-11-22 DIAGNOSIS — Z8572 Personal history of non-Hodgkin lymphomas: Secondary | ICD-10-CM | POA: Diagnosis not present

## 2020-11-22 DIAGNOSIS — C8380 Other non-follicular lymphoma, unspecified site: Secondary | ICD-10-CM | POA: Diagnosis not present

## 2020-11-22 NOTE — Progress Notes (Signed)
Brad May OFFICE PROGRESS NOTE   Diagnosis: Non-Hodgkin's lymphoma  INTERVAL HISTORY:   Brad May returns for a scheduled visit.  He developed influenza approximately 3 weeks ago.  He completed a course of antiviral therapy.  He had a fever, sore throat, and cough.  His symptoms have improved, but he continues to have a cough and mouth ulcers.  He also has erythematous lesions over the face.  No difficulty with bowel function.  No bleeding.  He felt well prior to developing influenza.  Objective:  Vital signs in last 24 hours:  Blood pressure 135/81, pulse 79, temperature 97.8 F (36.6 C), temperature source Oral, resp. rate 20, height 5\' 8"  (1.727 m), weight 167 lb 8 oz (76 kg), SpO2 98 %.    HEENT: 3-4 mm ulcers at the anterior right buccal mucosa, left buccal mucosa, and subungual regions.  No thrush.  Pharynx without erythema. Lymphatics: No cervical, supraclavicular, axillary, or inguinal nodes Resp: Lungs clear bilaterally Cardio: Regular rate and rhythm GI: No hepatosplenomegaly, no mass, nontender Vascular: No leg edema  Skin: Multiple 1-2 mm raised erythematous lesions over the face  Lab Results:  Lab Results  Component Value Date   WBC 6.2 07/26/2020   HGB 16.2 07/26/2020   HCT 46.0 07/26/2020   MCV 91 07/26/2020   PLT 218 07/26/2020   NEUTROABS 3.2 07/26/2020    CMP  Lab Results  Component Value Date   NA 139 07/26/2020   K 4.7 07/26/2020   CL 103 07/26/2020   CO2 21 07/26/2020   GLUCOSE 102 (H) 07/26/2020   BUN 12 07/26/2020   CREATININE 1.01 07/26/2020   CALCIUM 9.7 07/26/2020   PROT 6.5 07/26/2020   ALBUMIN 4.3 07/26/2020   AST 22 07/26/2020   ALT 25 07/26/2020   ALKPHOS 138 (H) 07/26/2020   BILITOT 0.8 07/26/2020   GFRNONAA 80 01/26/2020   GFRAA 93 01/26/2020     Medications: I have reviewed the patient's current medications.   Assessment/Plan: History of intermittent rectal bleeding.  Marginal zone lymphoma, initial  diagnosis with abnormal appearance of the colonic mucosa on a colonoscopy 07/31/2010 with a biopsy revealing an increased number of enlarged lymphoid aggregates.   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   CT of the abdomen and pelvis 08/04/2010 with abdominal/pelvic lymphadenopathy. PET scan 09/13/2010 confirming mildly enlarged abdominal/pelvic lymph nodes with borderline increased FDG activity. 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 Cycle 1 bendamustine/rituximab 05/01/2011 Cycle 2 bendamustine/rituximab 05/29/2011 Cycle 3 bendamustine/rituximab 06/26/2011 Restaging PET scan 07/27/2011-no evidence of lymphoma Restaging sigmoidoscopy 10/03/2011-normal colonic mucosa Colonoscopy 05/22/2017- multiple polyps removed, tubular adenomas History of iron deficiency. Ferritin was in the low normal range on 11/14/2010. Hemoglobin remains normal. ? left upper lung nodule on a chest x-ray 09/60/4540-[id number] hypermetabolic lung nodule was seen on a PET scan 09/13/2010 12.  [facility] spotted fever August 2021       Disposition: Brad. May remains in clinical remission from non-Hodgkin's lymphoma.  He was last treated with systemic therapy in 2013.  He is recovering from a recent influenza infection.  He will follow-up with his primary provider if the mouth ulcers and face lesions do not improve.  He had tubular adenomas on a colonoscopy in 2019.  He will follow-up with Dr. Fuller Plan for a repeat endoscopic evaluation as needed.  He will return for an office visit in 1 year.  Betsy Coder, MD  11/22/2020  11:09 AM

## 2020-11-24 ENCOUNTER — Encounter: Payer: Self-pay | Admitting: Internal Medicine

## 2020-11-25 DIAGNOSIS — L719 Rosacea, unspecified: Secondary | ICD-10-CM | POA: Diagnosis not present

## 2020-11-25 DIAGNOSIS — K1329 Other disturbances of oral epithelium, including tongue: Secondary | ICD-10-CM | POA: Diagnosis not present

## 2020-11-25 DIAGNOSIS — K1379 Other lesions of oral mucosa: Secondary | ICD-10-CM | POA: Diagnosis not present

## 2020-12-01 DIAGNOSIS — L719 Rosacea, unspecified: Secondary | ICD-10-CM | POA: Diagnosis not present

## 2020-12-01 DIAGNOSIS — K1329 Other disturbances of oral epithelium, including tongue: Secondary | ICD-10-CM | POA: Diagnosis not present

## 2020-12-01 DIAGNOSIS — K1379 Other lesions of oral mucosa: Secondary | ICD-10-CM | POA: Diagnosis not present

## 2020-12-01 DIAGNOSIS — L821 Other seborrheic keratosis: Secondary | ICD-10-CM | POA: Diagnosis not present

## 2020-12-26 ENCOUNTER — Encounter: Payer: Self-pay | Admitting: *Deleted

## 2020-12-30 DIAGNOSIS — Z23 Encounter for immunization: Secondary | ICD-10-CM | POA: Diagnosis not present

## 2021-01-05 DIAGNOSIS — K120 Recurrent oral aphthae: Secondary | ICD-10-CM | POA: Diagnosis not present

## 2021-01-17 ENCOUNTER — Ambulatory Visit (INDEPENDENT_AMBULATORY_CARE_PROVIDER_SITE_OTHER): Payer: Medicare Other | Admitting: Internal Medicine

## 2021-01-17 ENCOUNTER — Encounter: Payer: Self-pay | Admitting: Internal Medicine

## 2021-01-17 VITALS — BP 112/70 | HR 74 | Ht 68.0 in | Wt 168.0 lb

## 2021-01-17 DIAGNOSIS — Z86010 Personal history of colonic polyps: Secondary | ICD-10-CM

## 2021-01-17 DIAGNOSIS — C8590 Non-Hodgkin lymphoma, unspecified, unspecified site: Secondary | ICD-10-CM | POA: Diagnosis not present

## 2021-01-17 DIAGNOSIS — K120 Recurrent oral aphthae: Secondary | ICD-10-CM

## 2021-01-17 MED ORDER — SUTAB 1479-225-188 MG PO TABS: 1.0000 | ORAL_TABLET | Freq: Once | ORAL | 0 refills | Status: AC

## 2021-01-17 NOTE — Patient Instructions (Signed)
You have been scheduled for a colonoscopy. Please follow written instructions given to you at your visit today.  Please pick up your prep supplies at the pharmacy within the next 1-3 days. If you use inhalers (even only as needed), please bring them with you on the day of your procedure.  If you are age 82 or older, your body mass index should be between 23-30. Your Body mass index is 25.54 kg/m. If this is out of the aforementioned range listed, please consider follow up with your Primary Care Provider.  If you are age 26 or younger, your body mass index should be between 19-25. Your Body mass index is 25.54 kg/m. If this is out of the aformentioned range listed, please consider follow up with your Primary Care Provider.   ________________________________________________________  The [facility] providers would like to encourage you to use [facility] to communicate with providers for non-urgent requests or questions.  Due to long hold times on the telephone, sending your provider a message by [facility] may be a faster and more efficient way to get a response.  Please allow 48 business hours for a response.  Please remember that this is for non-urgent requests.  _______________________________________________________

## 2021-01-17 NOTE — Progress Notes (Signed)
Patient ID: Brad May, male   DOB: September 15, 1940, 81 y.o.   MRN: [id number] HPI: Brad May is an 81 year old male with a history of marginal zone lymphoma involving the colon (diagnosed and treated to remission in 2012), history of multiple adenomatous colon polyps, history of hyperlipidemia who presents to discuss surveillance colonoscopy.  He is here alone today.  His last colonoscopy was performed in May 2019.  4 polyps were removed all less than 1 cm.  These were found to be adenomatous.  There was diverticulosis in the left colon and small internal hemorrhoids.  He follows with Dr. Benay Spice with his history of lymphoma involving the colon.  He is feeling well.  He did suffer from influenza in November 2022 and has had some recurrent oral ulcerations since that time.  Pulmonary symptoms have resolved.  No dysphagia, odynophagia.  No nausea or vomiting.  No abdominal pain.  Regular bowel movements without abdominal pain.  He lost about 5 pounds when he had influenza and has not yet gained this back.  He intermittently has hemorrhoidal prolapse but no bleeding with wiping, blood in stool or melena.  He started oral doxycycline back for the oral ulcerations but not before.  He used to take this for rash.  Past Medical History:  Diagnosis Date   BPH (benign prostatic hyperplasia)    Duodenitis    Hyperlipidemia    Non Hodgkin's lymphoma ([place]) 40/81/4481   Periumbilical hernia    Thyroid disease    Tubular adenoma of colon     Past Surgical History:  Procedure Laterality Date   APPENDECTOMY     COLONOSCOPY     COLONOSCOPY W/ POLYPECTOMY     INGUINAL HERNIA REPAIR  2006   left   POLYPECTOMY      Outpatient Medications Prior to Visit  Medication Sig Dispense Refill   aspirin 81 MG tablet Take 81 mg by mouth daily.     atorvastatin (LIPITOR) 80 MG tablet Take 1 tablet (80 mg total) by mouth daily. 90 tablet 3   b complex vitamins tablet Take 1 tablet by mouth daily.      Cholecalciferol 25 MCG (1000 UT) tablet Take 2,000 Units by mouth daily.      fish oil-omega-3 fatty acids 1000 MG capsule Take 1 g by mouth daily.     levothyroxine (SYNTHROID) 88 MCG tablet Take 1 tablet (88 mcg total) by mouth daily. 90 tablet 3   Probiotic Product (PROBIOTIC-10 PO) Take 1 capsule by mouth daily.      sildenafil (REVATIO) 20 MG tablet TAKE 2-5 TABLETS BY MOUTH AS NEEDED FOR SEXUAL ACTIVITY 50 tablet 5   doxycycline (MONODOX) 50 MG capsule Take 50 mg by mouth 2 (two) times daily.     Facility-Administered Medications Prior to Visit  Medication Dose Route Frequency Provider Last Rate Last Admin   methylPREDNISolone acetate (DEPO-MEDROL) injection 80 mg  80 mg Intramuscular Once Gwenlyn Perking, FNP        Allergies  Allergen Reactions   Sulfonamide Derivatives     REACTION: unknown    Family History  Problem Relation Age of Onset   Heart disease Mother    Heart failure Mother        age 65   Heart disease Father    Heart attack Father 83   COPD Brother    Suicidality Daughter    Dementia Paternal Grandfather    Colon cancer Neg Hx    Stomach cancer Neg Hx  Esophageal cancer Neg Hx    Liver cancer Neg Hx    Pancreatic cancer Neg Hx    Prostate cancer Neg Hx    Rectal cancer Neg Hx    Ulcerative colitis Neg Hx     Social History   Tobacco Use   Smoking status: Never   Smokeless tobacco: Never  Vaping Use   Vaping Use: Never used  Substance Use Topics   Alcohol use: Yes    Comment: 1-2 month   Drug use: No    ROS: As per history of present illness, otherwise negative  BP 112/70    Pulse 74    Ht 5\' 8"  (1.727 m)    Wt 168 lb (76.2 kg)    BMI 25.54 kg/m  Gen: awake, alert, NAD HEENT: anicteric, scattered aphthous ulceration on the right buccal mucosa and right base of tongue x2 CV: RRR, no mrg Pulm: CTA b/l Abd: soft, NT/ND, +BS throughout Ext: no c/c/e Neuro: nonfocal  RELEVANT LABS AND IMAGING: CBC    Component Value Date/Time    WBC 6.2 07/26/2020 1040   WBC 8.9 10/21/2019 1655   RBC 5.04 07/26/2020 1040   RBC 4.59 10/21/2019 1655   HGB 16.2 07/26/2020 1040   HGB 15.3 11/09/2016 0848   HCT 46.0 07/26/2020 1040   HCT 44.5 11/09/2016 0848   PLT 218 07/26/2020 1040   MCV 91 07/26/2020 1040   MCV 94.5 11/09/2016 0848   MCH 32.1 07/26/2020 1040   MCH 31.4 10/21/2019 1655   MCHC 35.2 07/26/2020 1040   MCHC 34.4 10/21/2019 1655   RDW 12.8 07/26/2020 1040   RDW 12.8 11/09/2016 0848   LYMPHSABS 1.9 07/26/2020 1040   LYMPHSABS 1.5 11/09/2016 0848   MONOABS 0.6 11/18/2017 1118   MONOABS 0.6 11/09/2016 0848   EOSABS 0.3 07/26/2020 1040   BASOSABS 0.1 07/26/2020 1040   BASOSABS 0.1 11/09/2016 0848    CMP     Component Value Date/Time   NA 139 07/26/2020 1040   K 4.7 07/26/2020 1040   CL 103 07/26/2020 1040   CO2 21 07/26/2020 1040   GLUCOSE 102 (H) 07/26/2020 1040   GLUCOSE 102 (H) 10/21/2019 1655   BUN 12 07/26/2020 1040   CREATININE 1.01 07/26/2020 1040   CREATININE 0.81 10/21/2019 1655   CALCIUM 9.7 07/26/2020 1040   PROT 6.5 07/26/2020 1040   ALBUMIN 4.3 07/26/2020 1040   AST 22 07/26/2020 1040   ALT 25 07/26/2020 1040   ALKPHOS 138 (H) 07/26/2020 1040   BILITOT 0.8 07/26/2020 1040   GFRNONAA 80 01/26/2020 1647   GFRAA 93 01/26/2020 1647    ASSESSMENT/PLAN: 81 year old male with a history of marginal zone lymphoma involving the colon (diagnosed and treated to remission in 2012), history of multiple adenomatous colon polyps, history of hyperlipidemia who presents to discuss surveillance colonoscopy.   Personal history of adenomatous polyps/history of lymphoma involving colon --we discussed that screening and surveillance colonoscopy typically stops around age 7 but that this is an individualized decision.  He is discussed this with me today at length and also with Dr. Benay Spice.  He remains physically well and fit and appropriate for surveillance colonoscopy.  We reviewed the risk, benefits and  alternatives and he wishes to proceed --Colonoscopy in the [facility] for polyp surveillance  2.  Oral aphthous ulceration --likely postviral.  Seem to be improving.  If persistent then I would recommend viral brushing.  He can discuss this with primary care as well as his dentist.  No  esophageal symptoms to warrant upper endoscopy.      UL:[facility], [place], [place],  [place] [address]

## 2021-01-26 ENCOUNTER — Encounter: Payer: Self-pay | Admitting: Family Medicine

## 2021-01-26 ENCOUNTER — Ambulatory Visit (INDEPENDENT_AMBULATORY_CARE_PROVIDER_SITE_OTHER): Payer: Medicare Other | Admitting: Family Medicine

## 2021-01-26 VITALS — BP 116/71 | HR 71 | Temp 97.6°F | Ht 68.0 in | Wt 169.4 lb

## 2021-01-26 DIAGNOSIS — E034 Atrophy of thyroid (acquired): Secondary | ICD-10-CM | POA: Diagnosis not present

## 2021-01-26 DIAGNOSIS — E782 Mixed hyperlipidemia: Secondary | ICD-10-CM

## 2021-01-26 NOTE — Progress Notes (Signed)
Subjective:  Patient ID: Brad May, male    DOB: 1940-11-06  Age: 81 y.o. MRN: [id number]  CC: Medical Management of Chronic Issues   HPI DERAK SCHURMAN presents for  follow-up o  thyroid. The patient has a history of hypothyroidism for many years. It has been stable recently. Pt. denies any change in  voice, loss of hair, heat or cold intolerance. Energy level has been adequate to good. Patient denies constipation and diarrhea. No myxedema. Medication is as noted below. Verified that pt is taking it daily on an empty stomach. Well tolerated.   in for follow-up of elevated cholesterol. Doing well without complaints on current medication. Denies side effects of statin including myalgia and arthralgia and nausea. Currently no chest pain, shortness of breath or other cardiovascular related symptoms noted.  Planning colonoscopy for lymphoma tracking in early March.    History Kerwin has a past medical history of BPH (benign prostatic hyperplasia), Duodenitis, Hyperlipidemia, Non Hodgkin's lymphoma ([place]) (47/42/5956), Periumbilical hernia, Thyroid disease, and Tubular adenoma of colon.   He has a past surgical history that includes Appendectomy; Inguinal hernia repair (2006); Colonoscopy w/ polypectomy; Colonoscopy; and Polypectomy.   His family history includes COPD in his brother; Dementia in his paternal grandfather; Heart attack (age of onset: 39) in his father; Heart disease in his father and mother; Heart failure in his mother; Suicidality in his daughter.He reports that he has never smoked. He has never used smokeless tobacco. He reports current alcohol use. He reports that he does not use drugs.  Current Outpatient Medications on File Prior to Visit  Medication Sig Dispense Refill   aspirin 81 MG tablet Take 81 mg by mouth daily.     atorvastatin (LIPITOR) 80 MG tablet Take 1 tablet (80 mg total) by mouth daily. 90 tablet 3   b complex vitamins tablet Take 1 tablet by mouth  daily.     Cholecalciferol 25 MCG (1000 UT) tablet Take 2,000 Units by mouth daily.      doxycycline (MONODOX) 50 MG capsule Take 50 mg by mouth 2 (two) times daily.     fish oil-omega-3 fatty acids 1000 MG capsule Take 1 g by mouth daily.     levothyroxine (SYNTHROID) 88 MCG tablet Take 1 tablet (88 mcg total) by mouth daily. 90 tablet 3   Probiotic Product (PROBIOTIC-10 PO) Take 1 capsule by mouth daily.      sildenafil (REVATIO) 20 MG tablet TAKE 2-5 TABLETS BY MOUTH AS NEEDED FOR SEXUAL ACTIVITY 50 tablet 5   Current Facility-Administered Medications on File Prior to Visit  Medication Dose Route Frequency Provider Last Rate Last Admin   methylPREDNISolone acetate (DEPO-MEDROL) injection 80 mg  80 mg Intramuscular Once Gwenlyn Perking, FNP        ROS Review of Systems  Constitutional:  Negative for fever.  Respiratory:  Negative for shortness of breath.   Cardiovascular:  Negative for chest pain.  Musculoskeletal:  Negative for arthralgias.  Skin:  Negative for rash.   Objective:  BP 116/71    Pulse 71    Temp 97.6 F (36.4 C)    Ht '5\' 8"'  (1.727 m)    Wt 169 lb 6.4 oz (76.8 kg)    SpO2 96%    BMI 25.76 kg/m   BP Readings from Last 3 Encounters:  01/26/21 116/71  01/17/21 112/70  11/22/20 135/81    Wt Readings from Last 3 Encounters:  01/26/21 169 lb 6.4 oz (76.8 kg)  01/17/21  168 lb (76.2 kg)  11/22/20 167 lb 8 oz (76 kg)     Physical Exam Vitals reviewed.  Constitutional:      Appearance: He is well-developed.  HENT:     Head: Normocephalic and atraumatic.     Right Ear: External ear normal.     Left Ear: External ear normal.     Mouth/Throat:     Pharynx: No oropharyngeal exudate or posterior oropharyngeal erythema.  Eyes:     Pupils: Pupils are equal, round, and reactive to light.  Cardiovascular:     Rate and Rhythm: Normal rate and regular rhythm.     Heart sounds: No murmur heard. Pulmonary:     Effort: No respiratory distress.     Breath sounds:  Normal breath sounds.  Musculoskeletal:     Cervical back: Normal range of motion and neck supple.  Neurological:     Mental Status: He is alert and oriented to person, place, and time.      Assessment & Plan:   Gordie was seen today for medical management of chronic issues.  Diagnoses and all orders for this visit:  Hypothyroidism due to acquired atrophy of thyroid -     CBC with Differential/Platelet -     CMP14+EGFR -     TSH + free T4  Mixed hyperlipidemia -     CBC with Differential/Platelet -     CMP14+EGFR -     Lipid panel   Allergies as of 01/26/2021       Reactions   Sulfonamide Derivatives    REACTION: unknown        Medication List        Accurate as of January 26, 2021 10:26 AM. If you have any questions, ask your nurse or doctor.          aspirin 81 MG tablet Take 81 mg by mouth daily.   atorvastatin 80 MG tablet Commonly known as: LIPITOR Take 1 tablet (80 mg total) by mouth daily.   b complex vitamins tablet Take 1 tablet by mouth daily.   Cholecalciferol 25 MCG (1000 UT) tablet Take 2,000 Units by mouth daily.   doxycycline 50 MG capsule Commonly known as: MONODOX Take 50 mg by mouth 2 (two) times daily.   fish oil-omega-3 fatty acids 1000 MG capsule Take 1 g by mouth daily.   levothyroxine 88 MCG tablet Commonly known as: SYNTHROID Take 1 tablet (88 mcg total) by mouth daily.   PROBIOTIC-10 PO Take 1 capsule by mouth daily.   sildenafil 20 MG tablet Commonly known as: REVATIO TAKE 2-5 TABLETS BY MOUTH AS NEEDED FOR SEXUAL ACTIVITY            Follow-up: Return in about 6 months (around 07/26/2021) for [facility] physical.  Claretta Fraise, M.D.

## 2021-01-27 LAB — CMP14+EGFR
ALT: 28 IU/L (ref 0–44)
AST: 25 IU/L (ref 0–40)
Albumin/Globulin Ratio: 2.1 (ref 1.2–2.2)
Albumin: 4.4 g/dL (ref 3.6–4.6)
Alkaline Phosphatase: 138 IU/L — ABNORMAL HIGH (ref 44–121)
BUN/Creatinine Ratio: 16 (ref 10–24)
BUN: 15 mg/dL (ref 8–27)
Bilirubin Total: 0.6 mg/dL (ref 0.0–1.2)
CO2: 22 mmol/L (ref 20–29)
Calcium: 9.7 mg/dL (ref 8.6–10.2)
Chloride: 103 mmol/L (ref 96–106)
Creatinine, Ser: 0.91 mg/dL (ref 0.76–1.27)
Globulin, Total: 2.1 g/dL (ref 1.5–4.5)
Glucose: 97 mg/dL (ref 70–99)
Potassium: 4.8 mmol/L (ref 3.5–5.2)
Sodium: 140 mmol/L (ref 134–144)
Total Protein: 6.5 g/dL (ref 6.0–8.5)
eGFR: 85 mL/min/{1.73_m2} (ref >59)

## 2021-01-27 LAB — CBC WITH DIFFERENTIAL/PLATELET
Basophils Absolute: 0.1 10*3/uL (ref 0.0–0.2)
Basos: 1 %
EOS (ABSOLUTE): 0.3 10*3/uL (ref 0.0–0.4)
Eos: 4 %
Hematocrit: 45.9 % (ref 37.5–51.0)
Hemoglobin: 15.9 g/dL (ref 13.0–17.7)
Immature Grans (Abs): 0 10*3/uL (ref 0.0–0.1)
Immature Granulocytes: 0 %
Lymphocytes Absolute: 2.3 10*3/uL (ref 0.7–3.1)
Lymphs: 30 %
MCH: 31.9 pg (ref 26.6–33.0)
MCHC: 34.6 g/dL (ref 31.5–35.7)
MCV: 92 fL (ref 79–97)
Monocytes Absolute: 0.6 10*3/uL (ref 0.1–0.9)
Monocytes: 9 %
Neutrophils Absolute: 4.2 10*3/uL (ref 1.4–7.0)
Neutrophils: 56 %
Platelets: 219 10*3/uL (ref 150–450)
RBC: 4.99 x10E6/uL (ref 4.14–5.80)
RDW: 13.1 % (ref 11.6–15.4)
WBC: 7.5 10*3/uL (ref 3.4–10.8)

## 2021-01-27 LAB — LIPID PANEL
Chol/HDL Ratio: 3.8 ratio (ref 0.0–5.0)
Cholesterol, Total: 162 mg/dL (ref 100–199)
HDL: 43 mg/dL (ref >39)
LDL Chol Calc (NIH): 97 mg/dL (ref 0–99)
Triglycerides: 122 mg/dL (ref 0–149)
VLDL Cholesterol Cal: 22 mg/dL (ref 5–40)

## 2021-01-27 LAB — TSH+FREE T4
Free T4: 1.49 ng/dL (ref 0.82–1.77)
TSH: 2.08 u[IU]/mL (ref 0.450–4.500)

## 2021-01-27 NOTE — Progress Notes (Signed)
Hello Saqib,  Your lab result is normal and/or stable.Some minor variations that are not significant are commonly marked abnormal, but do not represent any medical problem for you.  Best regards, Christo Hain, M.D.

## 2021-02-24 ENCOUNTER — Encounter: Payer: Medicare Other | Admitting: Internal Medicine

## 2021-03-01 ENCOUNTER — Encounter: Payer: Self-pay | Admitting: *Deleted

## 2021-03-16 ENCOUNTER — Encounter: Payer: Self-pay | Admitting: Internal Medicine

## 2021-03-16 ENCOUNTER — Ambulatory Visit (AMBULATORY_SURGERY_CENTER): Payer: Medicare Other | Admitting: Internal Medicine

## 2021-03-16 VITALS — BP 121/76 | HR 76 | Temp 97.5°F | Resp 15 | Ht 68.0 in | Wt 168.0 lb

## 2021-03-16 DIAGNOSIS — Z86010 Personal history of colonic polyps: Secondary | ICD-10-CM | POA: Diagnosis not present

## 2021-03-16 DIAGNOSIS — D123 Benign neoplasm of transverse colon: Secondary | ICD-10-CM | POA: Diagnosis not present

## 2021-03-16 DIAGNOSIS — D122 Benign neoplasm of ascending colon: Secondary | ICD-10-CM

## 2021-03-16 MED ORDER — SODIUM CHLORIDE 0.9 % IV SOLN: 500.0000 mL | Freq: Once | INTRAVENOUS | Status: DC

## 2021-03-16 NOTE — Progress Notes (Signed)
PT taken to PACU. Monitors in place. VSS. Report given to RN. 

## 2021-03-16 NOTE — Patient Instructions (Signed)
Handout on polyps, diverticulosis, and hemorrhoids given. ? ?Await pathology results. ? ?YOU HAD AN ENDOSCOPIC PROCEDURE TODAY AT [facility]:   Refer to the procedure report that was given to you for any specific questions about what was found during the examination.  If the procedure report does not answer your questions, please call your gastroenterologist to clarify.  If you requested that your care partner not be given the details of your procedure findings, then the procedure report has been included in a sealed envelope for you to review at your convenience later. ? ?YOU SHOULD EXPECT: Some feelings of bloating in the abdomen. Passage of more gas than usual.  Walking can help get rid of the air that was put into your GI tract during the procedure and reduce the bloating. If you had a lower endoscopy (such as a colonoscopy or flexible sigmoidoscopy) you may notice spotting of blood in your stool or on the toilet paper. If you underwent a bowel prep for your procedure, you may not have a normal bowel movement for a few days. ? ?Please Note:  You might notice some irritation and congestion in your nose or some drainage.  This is from the oxygen used during your procedure.  There is no need for concern and it should clear up in a day or so. ? ?SYMPTOMS TO REPORT IMMEDIATELY: ? ?Following lower endoscopy (colonoscopy or flexible sigmoidoscopy): ? Excessive amounts of blood in the stool ? Significant tenderness or worsening of abdominal pains ? Swelling of the abdomen that is new, acute ? Fever of 100?F or higher ? ? ?For urgent or emergent issues, a gastroenterologist can be reached at any hour by calling [phone]. ?Do not use MyChart messaging for urgent concerns.  ? ? ?DIET:  We do recommend a small meal at first, but then you may proceed to your regular diet.  Drink plenty of fluids but you should avoid alcoholic beverages for 24 hours. ? ?ACTIVITY:  You should plan to take it easy for  the rest of today and you should NOT DRIVE or use heavy machinery until tomorrow (because of the sedation medicines used during the test).   ? ?FOLLOW UP: ?Our staff will call the number listed on your records 48-72 hours following your procedure to check on you and address any questions or concerns that you may have regarding the information given to you following your procedure. If we do not reach you, we will leave a message.  We will attempt to reach you two times.  During this call, we will ask if you have developed any symptoms of COVID 19. If you develop any symptoms (ie: fever, flu-like symptoms, shortness of breath, cough etc.) before then, please call [phone].  If you test positive for Covid 19 in the 2 weeks post procedure, please call and report this information to [place].   ? ?If any biopsies were taken you will be contacted by phone or by letter within the next 1-3 weeks.  Please call us at [phone] if you have not heard about the biopsies in 3 weeks.  ? ? ?SIGNATURES/CONFIDENTIALITY: ?You and/or your care partner have signed paperwork which will be entered into your electronic medical record.  These signatures attest to the fact that that the information above on your After Visit Summary has been reviewed and is understood.  Full responsibility of the confidentiality of this discharge information lies with you and/or your care-partner.  ?

## 2021-03-16 NOTE — Progress Notes (Signed)
Called to room to assist during endoscopic procedure.  Patient ID and intended procedure confirmed with present staff. Received instructions for my participation in the procedure from the performing physician.  

## 2021-03-16 NOTE — Progress Notes (Signed)
? ?GASTROENTEROLOGY PROCEDURE H&P NOTE  ? ?Primary Care Physician: ?Claretta Fraise, MD ? ? ? ?Reason for Procedure:  Personal history of adenomatous polyps and lymphoma involving the colon ? ?Plan:    Surveillance colonoscopy ? ?Patient is appropriate for endoscopic procedure(s) in the ambulatory ([facility]) setting. ? ?The nature of the procedure, as well as the risks, benefits, and alternatives were carefully and thoroughly reviewed with the patient. Ample time for discussion and questions allowed. The patient understood, was satisfied, and agreed to proceed.  ? ? ? ?HPI: ?Brad May is a 81 y.o. male who presents for colonoscopy.  Medical history as below.  Tolerated the prep.  No recent chest pain or shortness of breath.  No abdominal pain today. ? ?Past Medical History:  ?Diagnosis Date  ? BPH (benign prostatic hyperplasia)   ? Duodenitis   ? Hyperlipidemia   ? Non Hodgkin's lymphoma ([place]) 11/27/2010  ? Periumbilical hernia   ? Thyroid disease   ? Tubular adenoma of colon   ? ? ?Past Surgical History:  ?Procedure Laterality Date  ? APPENDECTOMY    ? COLONOSCOPY    ? COLONOSCOPY W/ POLYPECTOMY    ? INGUINAL HERNIA REPAIR  2006  ? left  ? POLYPECTOMY    ? ? ?Prior to Admission medications   ?Medication Sig Start Date End Date Taking? Authorizing Provider  ?aspirin 81 MG tablet Take 81 mg by mouth daily.   Yes [provider]  ?atorvastatin (LIPITOR) 80 MG tablet Take 1 tablet (80 mg total) by mouth daily. 07/26/20  Yes Claretta Fraise, MD  ?b complex vitamins tablet Take 1 tablet by mouth daily.   Yes [provider]  ?fish oil-omega-3 fatty acids 1000 MG capsule Take 1 g by mouth daily.   Yes [provider]  ?levothyroxine (SYNTHROID) 88 MCG tablet Take 1 tablet (88 mcg total) by mouth daily. 07/26/20  Yes Claretta Fraise, MD  ?Probiotic Product (PROBIOTIC-10 PO) Take 1 capsule by mouth daily.    Yes [provider]  ?Cholecalciferol 25 MCG (1000 UT) tablet Take 2,000 Units by  mouth daily.     [provider]  ?doxycycline (MONODOX) 50 MG capsule Take 50 mg by mouth 2 (two) times daily. 11/25/20   [provider]  ?sildenafil (REVATIO) 20 MG tablet TAKE 2-5 TABLETS BY MOUTH AS NEEDED FOR SEXUAL ACTIVITY 07/07/19   Claretta Fraise, MD  ? ? ?Current Outpatient Medications  ?Medication Sig Dispense Refill  ? aspirin 81 MG tablet Take 81 mg by mouth daily.    ? atorvastatin (LIPITOR) 80 MG tablet Take 1 tablet (80 mg total) by mouth daily. 90 tablet 3  ? b complex vitamins tablet Take 1 tablet by mouth daily.    ? fish oil-omega-3 fatty acids 1000 MG capsule Take 1 g by mouth daily.    ? levothyroxine (SYNTHROID) 88 MCG tablet Take 1 tablet (88 mcg total) by mouth daily. 90 tablet 3  ? Probiotic Product (PROBIOTIC-10 PO) Take 1 capsule by mouth daily.     ? Cholecalciferol 25 MCG (1000 UT) tablet Take 2,000 Units by mouth daily.     ? doxycycline (MONODOX) 50 MG capsule Take 50 mg by mouth 2 (two) times daily.    ? sildenafil (REVATIO) 20 MG tablet TAKE 2-5 TABLETS BY MOUTH AS NEEDED FOR SEXUAL ACTIVITY 50 tablet 5  ? ?Current Facility-Administered Medications  ?Medication Dose Route Frequency Provider Last Rate Last Admin  ? 0.9 %  sodium chloride infusion  500  mL Intravenous Once Aoki Wedemeyer, [name], MD      ? methylPREDNISolone acetate (DEPO-MEDROL) injection 80 mg  80 mg Intramuscular Once Gwenlyn Perking, FNP      ? ? ?Allergies as of 03/16/2021 - Review Complete 03/16/2021  ?Allergen Reaction Noted  ? Sulfonamide derivatives  12/09/2007  ? ? ?Family History  ?Problem Relation Age of Onset  ? Heart disease Mother   ? Heart failure Mother   ?     age 46  ? Heart disease Father   ? Heart attack Father 22  ? COPD Brother   ? Suicidality Daughter   ? Dementia Paternal Grandfather   ? Colon cancer Neg Hx   ? Stomach cancer Neg Hx   ? Esophageal cancer Neg Hx   ? Liver cancer Neg Hx   ? Pancreatic cancer Neg Hx   ? Prostate cancer Neg Hx   ? Rectal cancer Neg Hx   ? Ulcerative  colitis Neg Hx   ? ? ?Social History  ? ?Socioeconomic History  ? Marital status: Married  ?  Spouse name: Orlando Penner  ? Number of children: 2  ? Years of education: Not on file  ? Highest education level: Doctorate  ?Occupational History  ? Occupation: High school Principal  ?  Comment: retired  ?Tobacco Use  ? Smoking status: Never  ? Smokeless tobacco: Never  ?Vaping Use  ? Vaping Use: Never used  ?Substance and Sexual Activity  ? Alcohol use: Yes  ?  Comment: 1-2 month  ? Drug use: No  ? Sexual activity: Yes  ?Other Topics Concern  ? Not on file  ?Social History Narrative  ? Lives at home with wife.  Two children.  Daughter died suicide.  Two grands.  Retired Tourist information centre manager.    ? Son and grandson live close by.  ? ?Social Determinants of Health  ? ?Financial Resource Strain: Low Risk   ? Difficulty of Paying Living Expenses: Not hard at all  ?Food Insecurity: No Food Insecurity  ? Worried About Charity fundraiser in the Last Year: Never true  ? Ran Out of Food in the Last Year: Never true  ?Transportation Needs: No Transportation Needs  ? Lack of Transportation (Medical): No  ? Lack of Transportation (Non-Medical): No  ?Physical Activity: Sufficiently Active  ? Days of Exercise per Week: 5 days  ? Minutes of Exercise per Session: 30 min  ?Stress: No Stress Concern Present  ? Feeling of Stress : Not at all  ?Social Connections: Socially Integrated  ? Frequency of Communication with Friends and Family: More than three times a week  ? Frequency of Social Gatherings with Friends and Family: More than three times a week  ? Attends Religious Services: More than 4 times per year  ? Active Member of Clubs or Organizations: Yes  ? Attends Archivist Meetings: More than 4 times per year  ? Marital Status: Married  ?Intimate Partner Violence: Not At Risk  ? Fear of Current or Ex-Partner: No  ? Emotionally Abused: No  ? Physically Abused: No  ? Sexually Abused: No  ? ? ?Physical Exam: ?Vital signs in last 24 hours: ?@BP  (!)  156/91   Pulse 78   Temp (!) 97.5 ?F (36.4 ?C) (Temporal)   Ht 5\' 8"  (1.727 m)   Wt 168 lb (76.2 kg)   SpO2 97%   BMI 25.54 kg/m?  ?GEN: NAD ?EYE: Sclerae anicteric ?ENT: MMM ?CV: Non-tachycardic ?Pulm: CTA b/l ?GI: Soft, NT/ND ?  NEURO:  Alert & Oriented x 3 ? ? ?Zenovia Jarred, MD ?[facility] ? ?03/16/2021 1:36 PM ? ?

## 2021-03-16 NOTE — Op Note (Signed)
[place] ?Patient Name: Brad May ?Procedure Date: 03/16/2021 1:24 PM ?MRN: [id number] ?Endoscopist: Jerene Bears , MD ?Age: 81 ?Referring MD:  ?Date of Birth: Jun 13, 1940 ?Gender: Male ?Account #: [id number] ?Procedure:                Colonoscopy ?Indications:              High risk colon cancer surveillance: Personal  ?                          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                          - Prior to the procedure, a History and Physical  ?                          was performed, and patient medications and  ?                          allergies were reviewed. The patient's tolerance of  ?                          previous anesthesia was also reviewed. The risks  ?                          and benefits of the procedure and the sedation  ?                          options and risks were discussed with the patient.  ?                          All questions were answered, and informed consent  ?                          was obtained. Prior Anticoagulants: The patient has  ?                          taken no previous anticoagulant or antiplatelet  ?                          agents. ASA Grade Assessment: II - A patient with  ?                          mild systemic disease. After reviewing the risks  ?                          and benefits, the patient was deemed in  ?  satisfactory condition to undergo the procedure. ?                          After obtaining informed consent, the colonoscope  ?                          was passed under direct vision. Throughout the  ?                          procedure, the patient's blood pressure, pulse, and  ?                          oxygen saturations were monitored continuously. The  ?                          Olympus  CF-HQ190L (#[id number]) Colonoscope was  ?                          introduced through the anus and advanced to the  ?                          terminal ileum. The colonoscopy was performed  ?                          without difficulty. The patient tolerated the  ?                          procedure well. The quality of the bowel  ?                          preparation was good. The terminal ileum, ileocecal  ?                          valve, appendiceal orifice, and rectum were  ?                          photographed. ?Scope In: 1:42:52 PM ?Scope Out: 2:01:35 PM ?Scope Withdrawal Time: 0 hours 13 minutes 19 seconds  ?Total Procedure Duration: 0 hours 18 minutes 43 seconds  ?Findings:                 The digital rectal exam was normal. ?                          The terminal ileum appeared normal. ?                          Three sessile polyps were found in the ascending  ?                          colon. The polyps were 3 to 6 mm in size. These  ?                          polyps were removed with a cold snare. Resection  ?  and retrieval were complete. ?                          Two sessile polyps were found in the transverse  ?                          colon. The polyps were 3 to 5 mm in size. These  ?                          polyps were removed with a cold snare. Resection  ?                          and retrieval were complete. ?                          Multiple small and large-mouthed diverticula were  ?                          found in the sigmoid colon. ?                          Internal hemorrhoids were found during  ?                          retroflexion. The hemorrhoids were small. ?Complications:            No immediate complications. ?Estimated Blood Loss:     Estimated blood loss was minimal. ?Impression:               - The examined portion of the ileum was normal. ?                          - Three 3 to 6 mm polyps in the ascending colon,  ?                           removed with a cold snare. Resected and retrieved. ?                          - Two 3 to 5 mm polyps in the transverse colon,  ?                          removed with a cold snare. Resected and retrieved. ?                          - Diverticulosis in the sigmoid colon. ?                          - Small internal hemorrhoids. ?Recommendation:           - Patient has a contact number available for  ?                          emergencies. The signs and symptoms of potential  ?  delayed complications were discussed with the  ?                          patient. Return to normal activities tomorrow.  ?                          Written discharge instructions were provided to the  ?                          patient. ?                          - Resume previous diet. ?                          - Continue present medications. ?                          - Await pathology results. ?                          - No repeat colonoscopy due to age at next  ?                          surveillance interval. ?Jerene Bears, MD ?03/16/2021 2:05:00 PM ?This report has been signed electronically. ?

## 2021-03-20 ENCOUNTER — Telehealth: Payer: Self-pay | Admitting: *Deleted

## 2021-03-20 NOTE — Telephone Encounter (Signed)
?  Follow up Call- ? ?Call back number 03/16/2021  ?Post procedure Call Back phone  # [phone]  ?Permission to leave phone message Yes  ?Some recent data might be hidden  ?  ? ?Patient questions: ? ?Do you have a fever, pain , or abdominal swelling? No. ?Pain Score  0 * ? ?Have you tolerated food without any problems? Yes.   ? ?Have you been able to return to your normal activities? Yes.   ? ?Do you have any questions about your discharge instructions: ?Diet   No. ?Medications  No. ?Follow up visit  No. ? ?Do you have questions or concerns about your Care? No. ? ?Actions: ?* If pain score is 4 or above: ?No action needed, pain <4. ? ? ?

## 2021-03-23 ENCOUNTER — Encounter: Payer: Self-pay | Admitting: Internal Medicine

## 2021-05-24 DIAGNOSIS — H52223 Regular astigmatism, bilateral: Secondary | ICD-10-CM | POA: Diagnosis not present

## 2021-05-24 DIAGNOSIS — H524 Presbyopia: Secondary | ICD-10-CM | POA: Diagnosis not present

## 2021-05-24 DIAGNOSIS — H5213 Myopia, bilateral: Secondary | ICD-10-CM | POA: Diagnosis not present

## 2021-05-24 DIAGNOSIS — H25813 Combined forms of age-related cataract, bilateral: Secondary | ICD-10-CM | POA: Diagnosis not present

## 2021-05-24 DIAGNOSIS — H43813 Vitreous degeneration, bilateral: Secondary | ICD-10-CM | POA: Diagnosis not present

## 2021-05-31 DIAGNOSIS — L821 Other seborrheic keratosis: Secondary | ICD-10-CM | POA: Diagnosis not present

## 2021-05-31 DIAGNOSIS — K1379 Other lesions of oral mucosa: Secondary | ICD-10-CM | POA: Diagnosis not present

## 2021-05-31 DIAGNOSIS — S80862A Insect bite (nonvenomous), left lower leg, initial encounter: Secondary | ICD-10-CM | POA: Diagnosis not present

## 2021-05-31 DIAGNOSIS — Z85828 Personal history of other malignant neoplasm of skin: Secondary | ICD-10-CM | POA: Diagnosis not present

## 2021-05-31 DIAGNOSIS — L719 Rosacea, unspecified: Secondary | ICD-10-CM | POA: Diagnosis not present

## 2021-07-26 ENCOUNTER — Ambulatory Visit (INDEPENDENT_AMBULATORY_CARE_PROVIDER_SITE_OTHER): Payer: Medicare Other

## 2021-07-26 VITALS — Wt 168.0 lb

## 2021-07-26 DIAGNOSIS — Z0000 Encounter for general adult medical examination without abnormal findings: Secondary | ICD-10-CM

## 2021-07-26 NOTE — Progress Notes (Signed)
Subjective:   Brad May is a 81 y.o. male who presents for Medicare Annual/Subsequent preventive examination.  Virtual Visit via Telephone Note  I connected with  Brad May on 07/26/21 at  1:15 PM EDT by telephone and verified that I am speaking with the correct person using two identifiers.  Location: Patient: Home Provider: [facility] Persons participating in the virtual visit: patient/Nurse Health Advisor   I discussed the limitations, risks, security and privacy concerns of performing an evaluation and management service by telephone and the availability of in person appointments. The patient expressed understanding and agreed to proceed.  Interactive audio and video telecommunications were attempted between this nurse and patient, however failed, due to patient having technical difficulties OR patient did not have access to video capability.  We continued and completed visit with audio only.  Some vital signs may be absent or patient reported.   Annali Lybrand E Shanaye Rief, LPN   Review of Systems     Cardiac Risk Factors include: advanced age (>87mn, >>29women);male gender;dyslipidemia     Objective:    Today's Vitals   07/26/21 1320  Weight: 168 lb (76.2 kg)   Body mass index is 25.54 kg/m.     07/26/2021    1:28 PM 07/25/2020    1:43 PM 06/30/2018   10:37 AM 11/18/2017   12:16 PM 06/03/2017    8:57 AM 05/25/2016    9:04 AM 02/09/2016   10:13 AM  Advanced Directives  Does Patient Have a Medical Advance Directive? Yes Yes No No;Yes Yes Yes Yes  Type of [place]  [place] will Living will Living will Living will  Does patient want to make changes to medical advance directive?    No - Patient declined     Copy of [place] Chart? No - copy requested No - copy requested  No - copy requested     Would patient like information on creating  a medical advance directive?   Yes (MAU/Ambulatory/Procedural Areas - Information given)        Current Medications (verified) Outpatient Encounter Medications as of 07/26/2021  Medication Sig   aspirin 81 MG tablet Take 81 mg by mouth daily.   atorvastatin (LIPITOR) 80 MG tablet Take 1 tablet (80 mg total) by mouth daily.   b complex vitamins tablet Take 1 tablet by mouth daily.   Cholecalciferol 25 MCG (1000 UT) tablet Take 2,000 Units by mouth daily.    doxycycline (MONODOX) 50 MG capsule Take 50 mg by mouth 2 (two) times daily.   fish oil-omega-3 fatty acids 1000 MG capsule Take 1 g by mouth daily.   levothyroxine (SYNTHROID) 88 MCG tablet Take 1 tablet (88 mcg total) by mouth daily.   Probiotic Product (PROBIOTIC-10 PO) Take 1 capsule by mouth daily.    sildenafil (REVATIO) 20 MG tablet TAKE 2-5 TABLETS BY MOUTH AS NEEDED FOR SEXUAL ACTIVITY   Facility-Administered Encounter Medications as of 07/26/2021  Medication   methylPREDNISolone acetate (DEPO-MEDROL) injection 80 mg    Allergies (verified) Sulfonamide derivatives   History: Past Medical History:  Diagnosis Date   BPH (benign prostatic hyperplasia)    Duodenitis    Hyperlipidemia    Non Hodgkin's lymphoma ([place] 184/13/2440  Periumbilical hernia    Thyroid disease    Tubular adenoma of colon    Past Surgical History:  Procedure Laterality Date   APPENDECTOMY  COLONOSCOPY     COLONOSCOPY W/ POLYPECTOMY     INGUINAL HERNIA REPAIR  2006   left   POLYPECTOMY     Family History  Problem Relation Age of Onset   Heart disease Mother    Heart failure Mother        age 4   Heart disease Father    Heart attack Father 83   COPD Brother    Suicidality Daughter    Dementia Paternal Grandfather    Colon cancer Neg Hx    Stomach cancer Neg Hx    Esophageal cancer Neg Hx    Liver cancer Neg Hx    Pancreatic cancer Neg Hx    Prostate cancer Neg Hx    Rectal cancer Neg Hx    Ulcerative colitis Neg Hx    Social  History   Socioeconomic History   Marital status: Married    Spouse name: Orlando Penner   Number of children: 2   Years of education: Not on file   Highest education level: Doctorate  Occupational History   Occupation: High school Principal    Comment: retired  Tobacco Use   Smoking status: Never   Smokeless tobacco: Never  Vaping Use   Vaping Use: Never used  Substance and Sexual Activity   Alcohol use: Yes    Comment: 1-2 month   Drug use: No   Sexual activity: Yes  Other Topics Concern   Not on file  Social History Narrative   Lives at home with wife.  Two children.  Daughter died suicide.  Two grands.  Retired Tourist information centre manager.     Son and grandson live close by.   Enjoys golfing,    Social Determinants of Health   Financial Resource Strain: Low Risk  (07/26/2021)   Overall Financial Resource Strain (CARDIA)    Difficulty of Paying Living Expenses: Not hard at all  Food Insecurity: No Food Insecurity (07/26/2021)   Hunger Vital Sign    Worried About Running Out of Food in the Last Year: Never true    Ran Out of Food in the Last Year: Never true  Transportation Needs: No Transportation Needs (07/26/2021)   PRAPARE - Hydrologist (Medical): No    Lack of Transportation (Non-Medical): No  Physical Activity: Sufficiently Active (07/26/2021)   Exercise Vital Sign    Days of Exercise per Week: 5 days    Minutes of Exercise per Session: 30 min  Stress: No Stress Concern Present (07/26/2021)   [place]    Feeling of Stress : Not at all  Social Connections: [place] (07/26/2021)   Social Connection and Isolation Panel [NHANES]    Frequency of Communication with Friends and Family: More than three times a week    Frequency of Social Gatherings with Friends and Family: More than three times a week    Attends Religious Services: More than 4 times per year    Active Member of [employer] or  Organizations: Yes    Attends Music therapist: More than 4 times per year    Marital Status: Married    Tobacco Counseling Counseling given: Not Answered   Clinical Intake:  Pre-visit preparation completed: Yes  Pain : No/denies pain     BMI - recorded: 25.54 Nutritional Status: BMI 25 -29 Overweight Nutritional Risks: None Diabetes: No  How often do you need to have someone help you when you read instructions, pamphlets, or other  written materials from your doctor or pharmacy?: 1 - Never  Diabetic? no  Interpreter Needed?: No  Information entered by :: Jawanna Dykman, LPN   Activities of Daily Living    07/26/2021    1:25 PM  In your present state of health, do you have any difficulty performing the following activities:  Hearing? 0  Vision? 0  Difficulty concentrating or making decisions? 0  Walking or climbing stairs? 0  Dressing or bathing? 0  Doing errands, shopping? 0  Preparing Food and eating ? N  Using the Toilet? N  In the past six months, have you accidently leaked urine? N  Do you have problems with loss of bowel control? N  Managing your Medications? N  Managing your Finances? N  Housekeeping or managing your Housekeeping? N    Patient Care Team: Claretta Fraise, MD as PCP - General (Family Medicine) Pyrtle, [name], MD as Consulting Physician (Gastroenterology) [name], MD as Consulting Physician (Oncology)  Indicate any recent Medical Services you may have received from other than [facility] providers in the past year (date may be approximate).     Assessment:   This is a routine wellness examination for Waris.  Hearing/Vision screen Hearing Screening - Comments:: Denies hearing difficulties   Vision Screening - Comments:: Wears rx glasses - up to date with routine eye exams with [facility] at [facility]  Dietary issues and exercise activities discussed: Current Exercise Habits: Home exercise routine, Type of  exercise: walking;Other - see comments (swimming, golfing, push-mowing, yard and house work), Time (Minutes): 30, Frequency (Times/Week): 5, Weekly Exercise (Minutes/Week): 150, Intensity: Mild, Exercise limited by: None identified   Goals Addressed             This Visit's Progress    Exercise 150 min/wk Moderate Activity   On track    Continue 10,000 steps per day Maintain weight under 170# Stay healthy, prevent illness       Depression Screen    07/26/2021    1:24 PM 01/26/2021    9:56 AM 07/26/2020   10:02 AM 07/25/2020    1:34 PM 01/26/2020    3:59 PM 10/21/2019    4:27 PM 10/14/2019    8:11 AM  PHQ 2/9 Scores  PHQ - 2 Score 0 0 0 0 0 0 0    Fall Risk    07/26/2021    1:22 PM 01/26/2021    9:56 AM 07/26/2020   10:02 AM 07/25/2020    1:42 PM 01/26/2020    3:59 PM  [place] in the past year? 0 0 0 0 0  Number falls in past yr: 0   0 0  Injury with Fall? 0   0 0  Risk for fall due to : No Fall Risks   Orthopedic patient;Impaired vision No Fall Risks  Follow up Falls prevention discussed   Falls prevention discussed     FALL RISK PREVENTION PERTAINING TO THE HOME:  Any stairs in or around the home? Yes  If so, are there any without handrails? No  Home free of loose throw rugs in walkways, pet beds, electrical cords, etc? Yes  Adequate lighting in your home to reduce risk of falls? Yes   ASSISTIVE DEVICES UTILIZED TO PREVENT FALLS:  Life alert? No  Use of a cane, walker or w/c? No  Grab bars in the bathroom? No  Shower chair or bench in shower? Yes  Elevated toilet seat or a handicapped  toilet? No   TIMED UP AND GO:  Was the test performed? No . Telephonic visit  Cognitive Function:    06/03/2017    8:59 AM 05/25/2016    9:08 AM 10/21/2014    3:34 PM  MMSE - Mini Mental State Exam  Orientation to time '5 5 4  '$ Orientation to Place '5 5 5  '$ Registration '3 3 3  '$ Attention/ Calculation '4 5 5  '$ Recall '3 3 3  '$ Language- name 2 objects '2 2 2  '$ Language-  repeat '1 1 1  '$ Language- follow 3 step command '3 3 3  '$ Language- read & follow direction '1 1 1  '$ Write a sentence '1 1 1  '$ Copy design '1 1 1  '$ Total score '29 30 29        '$ 07/26/2021    1:28 PM 06/30/2018   10:39 AM  6CIT Screen  What Year? 0 points 0 points  What month? 0 points 0 points  What time? 0 points 0 points  Count back from 20 0 points 0 points  Months in reverse 0 points 0 points  Repeat phrase 0 points 0 points  Total Score 0 points 0 points    Immunizations Immunization History  Administered Date(s) Administered   Fluad Quad(high Dose 65+) 10/06/2018, 12/30/2020   Influenza, High Dose Seasonal PF 11/09/2016, 11/18/2017, 11/02/2019   Influenza,inj,Quad PF,6+ Mos 10/21/2013, 10/21/2014, 11/11/2015   Influenza-Unspecified 10/06/2018   Moderna Covid-19 Vaccine Bivalent Booster 62yr & up 01/18/2021   Moderna Sars-Covid-2 Vaccination 11/08/2019, 06/09/2020   Pneumococcal Conjugate-13 12/17/2012   Pneumococcal Polysaccharide-23 11/15/2004, 11/20/2018   Tdap 11/15/2011   Zoster, Live 07/10/2006    TDAP status: Up to date  Flu Vaccine status: Up to date  Pneumococcal vaccine status: Up to date  Covid-19 vaccine status: Completed vaccines  Qualifies for Shingles Vaccine? Yes   Zostavax completed Yes   Shingrix Completed?: No.    Education has been provided regarding the importance of this vaccine. Patient has been advised to call insurance company to determine out of pocket expense if they have not yet received this vaccine. Advised may also receive vaccine at local pharmacy or Health Dept. Verbalized acceptance and understanding.  Screening Tests Health Maintenance  Topic Date Due   Zoster Vaccines- Shingrix (1 of 2) Never done   COVID-19 Vaccine (4 - Booster for Moderna series) 03/15/2021   INFLUENZA VACCINE  08/15/2021   TETANUS/TDAP  11/14/2021   Pneumonia Vaccine 81 Years old  Completed   HPV VACCINES  Aged Out   COLONOSCOPY (Pts 45-460yrInsurance coverage  will need to be confirmed)  Discontinued    Health Maintenance  Health Maintenance Due  Topic Date Due   Zoster Vaccines- Shingrix (1 of 2) Never done   COVID-19 Vaccine (4 - Booster for Moderna series) 03/15/2021    Colorectal cancer screening: No longer required.   Lung Cancer Screening: (Low Dose CT Chest recommended if Age 82-80ears, 30 pack-year currently smoking OR have quit w/in 15years.) does not qualify  Additional Screening:  Hepatitis C Screening: does not qualify  Vision Screening: Recommended annual ophthalmology exams for early detection of glaucoma and other disorders of the eye. Is the patient up to date with their annual eye exam?  Yes  Who is the provider or what is the name of the office in which the patient attends annual eye exams? [facility] [place] with a provider, would they like to be referred to a provider to establish  care? No .   Dental Screening: Recommended annual dental exams for proper oral hygiene  Community Resource Referral / Chronic Care Management: CRR required this visit?  No   CCM required this visit?  No      Plan:     I have personally reviewed and noted the following in the patient's chart:   Medical and social history Use of alcohol, tobacco or illicit drugs  Current medications and supplements including opioid prescriptions. Patient is not currently taking opioid prescriptions. Functional ability and status Nutritional status Physical activity Advanced directives List of other physicians Hospitalizations, surgeries, and ER visits in previous 12 months Vitals Screenings to include cognitive, depression, and falls Referrals and appointments  In addition, I have reviewed and discussed with patient certain preventive protocols, quality metrics, and best practice recommendations. A written personalized care plan for preventive services as well as general preventive health recommendations were  provided to patient.     Sandrea Hammond, LPN   09/18/90   Nurse Notes: None

## 2021-07-26 NOTE — Patient Instructions (Signed)
[name] , Thank you for taking time to come for your Medicare Wellness Visit. I appreciate your ongoing commitment to your health goals. Please review the following plan we discussed and let me know if I can assist you in the future.   Screening recommendations/referrals: Colonoscopy: Done 03/16/2021 - no repeat required Recommended yearly ophthalmology/optometry visit for glaucoma screening and checkup Recommended yearly dental visit for hygiene and checkup  Vaccinations: Influenza vaccine: Done 12/30/2020 - Repeat annually  Pneumococcal vaccine: Done  12/17/2012 & 11/20/2018  Tdap vaccine: Done 11/15/2011 - Repeat in 10 years *due this fall Shingles vaccine: Done 2008 - due for Shingrix which is 2 doses 2-6 months apart and over 90% effective *now covered by Medicare Part D Covid-19: Done 11/08/2019, 06/09/2020 & 01/18/2021  Advanced directives: Please bring a copy of your health care power of attorney and living will to the office to be added to your chart at your convenience.   Conditions/risks identified: Aim for 30 minutes of exercise or brisk walking, 6-8 glasses of water, and 5 servings of fruits and vegetables each day.   Next appointment: Follow up in one year for your annual wellness visit.   Preventive Care 33 Years and Older, Male  Preventive care refers to lifestyle choices and visits with your health care provider that can promote health and wellness. What does preventive care include? A yearly physical exam. This is also called an annual well check. Dental exams once or twice a year. Routine eye exams. Ask your health care provider how often you should have your eyes checked. Personal lifestyle choices, including: Daily care of your teeth and gums. Regular physical activity. Eating a healthy diet. Avoiding tobacco and drug use. Limiting alcohol use. Practicing safe sex. Taking low doses of aspirin every day. Taking vitamin and mineral supplements as recommended by  your health care provider. What happens during an annual well check? The services and screenings done by your health care provider during your annual well check will depend on your age, overall health, lifestyle risk factors, and family history of disease. Counseling  Your health care provider may ask you questions about your: Alcohol use. Tobacco use. Drug use. Emotional well-being. Home and relationship well-being. Sexual activity. Eating habits. History of falls. Memory and ability to understand (cognition). Work and work Statistician. Screening  You may have the following tests or measurements: Height, weight, and BMI. Blood pressure. Lipid and cholesterol levels. These may be checked every 5 years, or more frequently if you are over 45 years old. Skin check. Lung cancer screening. You may have this screening every year starting at age 55 if you have a 30-pack-year history of smoking and currently smoke or have quit within the past 15 years. Fecal occult blood test (FOBT) of the stool. You may have this test every year starting at age 17. Flexible sigmoidoscopy or colonoscopy. You may have a sigmoidoscopy every 5 years or a colonoscopy every 10 years starting at age 41. Prostate cancer screening. Recommendations will vary depending on your family history and other risks. Hepatitis C blood test. Hepatitis B blood test. Sexually transmitted disease (STD) testing. Diabetes screening. This is done by checking your blood sugar (glucose) after you have not eaten for a while (fasting). You may have this done every 1-3 years. Abdominal aortic aneurysm (AAA) screening. You may need this if you are a current or former smoker. Osteoporosis. You may be screened starting at age 57 if you are at high risk. Talk with your  health care provider about your test results, treatment options, and if necessary, the need for more tests. Vaccines  Your health care provider may recommend certain vaccines,  such as: Influenza vaccine. This is recommended every year. Tetanus, diphtheria, and acellular pertussis (Tdap, Td) vaccine. You may need a Td booster every 10 years. Zoster vaccine. You may need this after age 54. Pneumococcal 13-valent conjugate (PCV13) vaccine. One dose is recommended after age 2. Pneumococcal polysaccharide (PPSV23) vaccine. One dose is recommended after age 40. Talk to your health care provider about which screenings and vaccines you need and how often you need them. This information is not intended to replace advice given to you by your health care provider. Make sure you discuss any questions you have with your health care provider. Document Released: 01/28/2015 Document Revised: 09/21/2015 Document Reviewed: 11/02/2014 Elsevier Interactive Patient Education  2017 [employer] Prevention in the Home Falls can cause injuries. They can happen to people of all ages. There are many things you can do to make your home safe and to help prevent falls. What can I do on the outside of my home? Regularly fix the edges of walkways and driveways and fix any cracks. Remove anything that might make you trip as you walk through a door, such as a raised step or threshold. Trim any bushes or trees on the path to your home. Use bright outdoor lighting. Clear any walking paths of anything that might make someone trip, such as rocks or tools. Regularly check to see if handrails are loose or broken. Make sure that both sides of any steps have handrails. Any raised decks and porches should have guardrails on the edges. Have any leaves, snow, or ice cleared regularly. Use sand or salt on walking paths during winter. Clean up any spills in your garage right away. This includes oil or grease spills. What can I do in the bathroom? Use night lights. Install grab bars by the toilet and in the tub and shower. Do not use towel bars as grab bars. Use non-skid mats or decals in the tub or  shower. If you need to sit down in the shower, use a plastic, non-slip stool. Keep the floor dry. Clean up any water that spills on the floor as soon as it happens. Remove soap buildup in the tub or shower regularly. Attach bath mats securely with double-sided non-slip rug tape. Do not have throw rugs and other things on the floor that can make you trip. What can I do in the bedroom? Use night lights. Make sure that you have a light by your bed that is easy to reach. Do not use any sheets or blankets that are too big for your bed. They should not hang down onto the floor. Have a firm chair that has side arms. You can use this for support while you get dressed. Do not have throw rugs and other things on the floor that can make you trip. What can I do in the kitchen? Clean up any spills right away. Avoid walking on wet floors. Keep items that you use a lot in easy-to-reach places. If you need to reach something above you, use a strong step stool that has a grab bar. Keep electrical cords out of the way. Do not use floor polish or wax that makes floors slippery. If you must use wax, use non-skid floor wax. Do not have throw rugs and other things on the floor that can make you trip. What  can I do with my stairs? Do not leave any items on the stairs. Make sure that there are handrails on both sides of the stairs and use them. Fix handrails that are broken or loose. Make sure that handrails are as long as the stairways. Check any carpeting to make sure that it is firmly attached to the stairs. Fix any carpet that is loose or worn. Avoid having throw rugs at the top or bottom of the stairs. If you do have throw rugs, attach them to the floor with carpet tape. Make sure that you have a light switch at the top of the stairs and the bottom of the stairs. If you do not have them, ask someone to add them for you. What else can I do to help prevent falls? Wear shoes that: Do not have high heels. Have  rubber bottoms. Are comfortable and fit you well. Are closed at the toe. Do not wear sandals. If you use a stepladder: Make sure that it is fully opened. Do not climb a closed stepladder. Make sure that both sides of the stepladder are locked into place. Ask someone to hold it for you, if possible. Clearly mark and make sure that you can see: Any grab bars or handrails. First and last steps. Where the edge of each step is. Use tools that help you move around (mobility aids) if they are needed. These include: Canes. Walkers. Scooters. Crutches. Turn on the lights when you go into a dark area. Replace any light bulbs as soon as they burn out. Set up your furniture so you have a clear path. Avoid moving your furniture around. If any of your floors are uneven, fix them. If there are any pets around you, be aware of where they are. Review your medicines with your doctor. Some medicines can make you feel dizzy. This can increase your chance of falling. Ask your doctor what other things that you can do to help prevent falls. This information is not intended to replace advice given to you by your health care provider. Make sure you discuss any questions you have with your health care provider. Document Released: 10/28/2008 Document Revised: 06/09/2015 Document Reviewed: 02/05/2014 Elsevier Interactive Patient Education  2017 [employer].

## 2021-08-03 ENCOUNTER — Ambulatory Visit: Payer: Medicare Other | Admitting: Family Medicine

## 2021-08-17 ENCOUNTER — Ambulatory Visit (INDEPENDENT_AMBULATORY_CARE_PROVIDER_SITE_OTHER): Payer: Medicare Other | Admitting: Family Medicine

## 2021-08-17 ENCOUNTER — Encounter: Payer: Self-pay | Admitting: Family Medicine

## 2021-08-17 DIAGNOSIS — E782 Mixed hyperlipidemia: Secondary | ICD-10-CM

## 2021-08-17 DIAGNOSIS — N401 Enlarged prostate with lower urinary tract symptoms: Secondary | ICD-10-CM

## 2021-08-17 DIAGNOSIS — Z23 Encounter for immunization: Secondary | ICD-10-CM

## 2021-08-17 DIAGNOSIS — E034 Atrophy of thyroid (acquired): Secondary | ICD-10-CM | POA: Diagnosis not present

## 2021-08-17 DIAGNOSIS — R030 Elevated blood-pressure reading, without diagnosis of hypertension: Secondary | ICD-10-CM | POA: Diagnosis not present

## 2021-08-17 DIAGNOSIS — R350 Frequency of micturition: Secondary | ICD-10-CM | POA: Diagnosis not present

## 2021-08-17 LAB — URINALYSIS
Bilirubin, UA: NEGATIVE
Glucose, UA: NEGATIVE
Ketones, UA: NEGATIVE
Leukocytes,UA: NEGATIVE
Nitrite, UA: NEGATIVE
Protein,UA: NEGATIVE
Specific Gravity, UA: 1.02 (ref 1.005–1.030)
Urobilinogen, Ur: 0.2 mg/dL (ref 0.2–1.0)
pH, UA: 5.5 (ref 5.0–7.5)

## 2021-08-17 MED ORDER — LEVOTHYROXINE SODIUM 88 MCG PO TABS: 88.0000 ug | ORAL_TABLET | Freq: Every day | ORAL | 3 refills | Status: DC

## 2021-08-17 MED ORDER — NYSTATIN 100000 UNIT/ML MT SUSP: 5.0000 mL | Freq: Four times a day (QID) | OROMUCOSAL | 2 refills | Status: DC

## 2021-08-17 NOTE — Progress Notes (Signed)
Subjective:  Patient ID: Brad May, male    DOB: 03/13/1940  Age: 81 y.o. MRN: [id number]  CC: Annual Exam   HPI  Brad May presents for Annual exam   follow-up on  thyroid. The patient has a history of hypothyroidism for many years. It has been stable recently. Pt. denies any change in  voice, loss of hair, heat or cold intolerance. Energy level has been adequate to good. Patient denies constipation and diarrhea. No myxedema. Medication is as noted below. Verified that pt is taking it daily on an empty stomach. Well tolerated.   in for follow-up of elevated cholesterol. Doing well without complaints on current medication. Denies side effects of statin including myalgia and arthralgia and nausea. Currently no chest pain, shortness of breath or other cardiovascular related symptoms noted.  Annual appontment in the Fall with Dr. Benay May to follow his lymphoma.       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   He has a past surgical history that includes Appendectomy; Inguinal hernia repair (2006); Colonoscopy w/ polypectomy; Colonoscopy; and Polypectomy.   His family history includes COPD in his brother; Dementia in his paternal grandfather; Heart attack (age of onset: 51) in his father; Heart disease in his father and mother; Heart failure in his mother; Suicidality in his daughter.He reports that he has never smoked. He has never used smokeless tobacco. He reports current alcohol use. He reports that he does not use drugs.    ROS Review of Systems  Constitutional:  Negative for activity change, fatigue and  unexpected weight change.  HENT:  Positive for mouth sores (using nystatin QID). Negative for congestion, ear pain, hearing loss, postnasal drip and trouble swallowing.   Eyes:  Negative for pain and visual disturbance.  Respiratory:  Negative for cough, chest tightness and shortness of breath.   Cardiovascular:  Negative for chest pain, palpitations and leg swelling.  Gastrointestinal:  Negative for abdominal distention, abdominal pain, blood in stool, constipation, diarrhea, nausea and vomiting.  Endocrine: Negative for cold intolerance, heat intolerance and polydipsia.  Genitourinary:  Negative for difficulty urinating, dysuria, flank pain, frequency and urgency.  Musculoskeletal:  Negative for arthralgias and joint swelling.  Skin:  Negative for color change, rash and wound.  Neurological:  Negative for dizziness, syncope, weakness, light-headedness, numbness and headaches.  Hematological:  Does not bruise/bleed easily.  Psychiatric/Behavioral:  Negative for confusion, decreased concentration, dysphoric mood and sleep disturbance. The patient is not nervous/anxious.     Objective:  BP 117/77   Pulse 69   Temp 97.9 F (36.6 C)   Ht '5\' 8"'  (1.727 m)   Wt 169 lb 3.2 oz (76.7 kg)   SpO2 96%   BMI 25.73 kg/m   BP Readings from Last 3 Encounters:  08/17/21 117/77  03/16/21 121/76  01/26/21 116/71    Wt Readings from Last 3 Encounters:  08/17/21 169 lb 3.2 oz (76.7 kg)  07/26/21 168 lb (76.2 kg)  03/16/21 168 lb (76.2 kg)     Physical Exam Constitutional:      Appearance: He is well-developed.  HENT:     Head: Normocephalic and atraumatic.  Eyes:     Pupils: Pupils are equal, round, and reactive to light.  Neck:     Thyroid: No thyromegaly.     Trachea: No tracheal deviation.  Cardiovascular:     Rate and Rhythm: Normal rate and regular rhythm.     Heart sounds: Normal heart sounds. No murmur heard.    No friction rub. No gallop.  Pulmonary:     Breath sounds: Normal  breath sounds. No wheezing or rales.  Abdominal:     General: Bowel sounds are normal. There is no distension.     Palpations: Abdomen is soft. There is no mass.     Tenderness: There is no abdominal tenderness.     Hernia: There is no hernia in the left inguinal area.  Genitourinary:    Penis: Normal.      Testes: Normal.  Musculoskeletal:        General: Normal range of motion.     Cervical back: Normal range of motion.  Lymphadenopathy:     Cervical: No cervical adenopathy.  Skin:    General: Skin is warm and dry.  Neurological:     Mental Status: He is alert and oriented to person, place, and time.       Assessment & Plan:   Brad May was seen today for annual exam.  Diagnoses and all orders for this visit:  Mixed hyperlipidemia -     Lipid panel -     TSH + free T4 -     Urinalysis  Hypothyroidism due to acquired atrophy of thyroid -     levothyroxine (SYNTHROID) 88 MCG tablet; Take 1 tablet (88 mcg total) by mouth daily. -     Urinalysis  Elevated blood-pressure reading, without diagnosis of hypertension -     CBC with Differential/Platelet -     CMP14+EGFR -     Urinalysis  Benign prostatic hyperplasia with urinary frequency -     PSA, total and free -     Urinalysis  Need for shingles vaccine -     Zoster Recombinant (Shingrix )  Other orders -     nystatin (MYCOSTATIN) 100000 UNIT/ML suspension; Take 5 mLs (500,000 Units total) by mouth 4 (four) times daily. Swish in mouth and spit out       I am having Brad May "[name]" start on nystatin. I am also having him maintain his aspirin, fish oil-omega-3 fatty acids, b complex vitamins, Cholecalciferol, Probiotic Product (PROBIOTIC-10 PO), sildenafil, atorvastatin, doxycycline, and levothyroxine. We will continue to administer methylPREDNISolone acetate.  Allergies as of 08/17/2021       Reactions   Sulfonamide Derivatives    REACTION: unknown        Medication List        Accurate as of  August 17, 2021 11:59 PM. If you have any questions, ask your nurse or doctor.          aspirin 81 MG tablet Take 81 mg by mouth daily.   atorvastatin 80 MG tablet Commonly known as: LIPITOR Take 1 tablet (80 mg total) by mouth daily.   b complex vitamins tablet Take 1 tablet by mouth daily.   Cholecalciferol 25 MCG (1000 UT) tablet Take 2,000 Units by mouth daily.   doxycycline 50 MG capsule Commonly known as: MONODOX Take 50 mg by mouth 2 (two) times daily.   fish oil-omega-3 fatty acids 1000 MG capsule Take 1 g by mouth daily.  levothyroxine 88 MCG tablet Commonly known as: SYNTHROID Take 1 tablet (88 mcg total) by mouth daily.   nystatin 100000 UNIT/ML suspension Commonly known as: MYCOSTATIN Take 5 mLs (500,000 Units total) by mouth 4 (four) times daily. Swish in mouth and spit out Started by: Claretta Fraise, MD   PROBIOTIC-10 PO Take 1 capsule by mouth daily.   sildenafil 20 MG tablet Commonly known as: REVATIO TAKE 2-5 TABLETS BY MOUTH AS NEEDED FOR SEXUAL ACTIVITY         Follow-up: Return in about 6 months (around 02/17/2022) for Hypothyroidism.  Claretta Fraise, M.D.

## 2021-08-18 ENCOUNTER — Encounter: Payer: Self-pay | Admitting: Family Medicine

## 2021-08-18 LAB — PSA, TOTAL AND FREE
PSA, Free Pct: 25 %
PSA, Free: 0.7 ng/mL
Prostate Specific Ag, Serum: 2.8 ng/mL (ref 0.0–4.0)

## 2021-08-18 LAB — CBC WITH DIFFERENTIAL/PLATELET
Basophils Absolute: 0.1 10*3/uL (ref 0.0–0.2)
Basos: 1 %
EOS (ABSOLUTE): 0.3 10*3/uL (ref 0.0–0.4)
Eos: 4 %
Hematocrit: 47.4 % (ref 37.5–51.0)
Hemoglobin: 16.4 g/dL (ref 13.0–17.7)
Immature Grans (Abs): 0 10*3/uL (ref 0.0–0.1)
Immature Granulocytes: 0 %
Lymphocytes Absolute: 2.2 10*3/uL (ref 0.7–3.1)
Lymphs: 34 %
MCH: 31.5 pg (ref 26.6–33.0)
MCHC: 34.6 g/dL (ref 31.5–35.7)
MCV: 91 fL (ref 79–97)
Monocytes Absolute: 0.7 10*3/uL (ref 0.1–0.9)
Monocytes: 11 %
Neutrophils Absolute: 3.3 10*3/uL (ref 1.4–7.0)
Neutrophils: 50 %
Platelets: 212 10*3/uL (ref 150–450)
RBC: 5.21 x10E6/uL (ref 4.14–5.80)
RDW: 11.9 % (ref 11.6–15.4)
WBC: 6.5 10*3/uL (ref 3.4–10.8)

## 2021-08-18 LAB — CMP14+EGFR
ALT: 22 IU/L (ref 0–44)
AST: 26 IU/L (ref 0–40)
Albumin/Globulin Ratio: 1.8 (ref 1.2–2.2)
Albumin: 4.4 g/dL (ref 3.7–4.7)
Alkaline Phosphatase: 135 IU/L — ABNORMAL HIGH (ref 44–121)
BUN/Creatinine Ratio: 18 (ref 10–24)
BUN: 18 mg/dL (ref 8–27)
Bilirubin Total: 1 mg/dL (ref 0.0–1.2)
CO2: 22 mmol/L (ref 20–29)
Calcium: 9.7 mg/dL (ref 8.6–10.2)
Chloride: 103 mmol/L (ref 96–106)
Creatinine, Ser: 0.99 mg/dL (ref 0.76–1.27)
Globulin, Total: 2.5 g/dL (ref 1.5–4.5)
Glucose: 114 mg/dL — ABNORMAL HIGH (ref 70–99)
Potassium: 4.8 mmol/L (ref 3.5–5.2)
Sodium: 139 mmol/L (ref 134–144)
Total Protein: 6.9 g/dL (ref 6.0–8.5)
eGFR: 77 mL/min/{1.73_m2} (ref >59)

## 2021-08-18 LAB — TSH+FREE T4
Free T4: 1.51 ng/dL (ref 0.82–1.77)
TSH: 0.981 u[IU]/mL (ref 0.450–4.500)

## 2021-08-18 LAB — LIPID PANEL
Chol/HDL Ratio: 4.2 ratio (ref 0.0–5.0)
Cholesterol, Total: 161 mg/dL (ref 100–199)
HDL: 38 mg/dL — ABNORMAL LOW (ref >39)
LDL Chol Calc (NIH): 104 mg/dL — ABNORMAL HIGH (ref 0–99)
Triglycerides: 105 mg/dL (ref 0–149)
VLDL Cholesterol Cal: 19 mg/dL (ref 5–40)

## 2021-08-18 NOTE — Progress Notes (Signed)
Hello Brad May,  Your lab result is normal and/or stable.Some minor variations that are not significant are commonly marked abnormal, but do not represent any medical problem for you.  Best regards, Jonatan Wilsey, M.D.

## 2021-09-19 DIAGNOSIS — L821 Other seborrheic keratosis: Secondary | ICD-10-CM | POA: Diagnosis not present

## 2021-09-19 DIAGNOSIS — K1329 Other disturbances of oral epithelium, including tongue: Secondary | ICD-10-CM | POA: Diagnosis not present

## 2021-09-19 DIAGNOSIS — S80862A Insect bite (nonvenomous), left lower leg, initial encounter: Secondary | ICD-10-CM | POA: Diagnosis not present

## 2021-09-19 DIAGNOSIS — L719 Rosacea, unspecified: Secondary | ICD-10-CM | POA: Diagnosis not present

## 2021-10-21 ENCOUNTER — Other Ambulatory Visit: Payer: Self-pay | Admitting: Family Medicine

## 2021-10-21 DIAGNOSIS — E782 Mixed hyperlipidemia: Secondary | ICD-10-CM

## 2021-11-22 ENCOUNTER — Other Ambulatory Visit (HOSPITAL_BASED_OUTPATIENT_CLINIC_OR_DEPARTMENT_OTHER): Payer: Self-pay

## 2021-11-22 ENCOUNTER — Inpatient Hospital Stay: Payer: Medicare Other

## 2021-11-22 ENCOUNTER — Inpatient Hospital Stay: Payer: Medicare Other | Attending: Oncology | Admitting: Oncology

## 2021-11-22 VITALS — BP 130/89 | HR 78 | Temp 98.2°F | Resp 20 | Ht 68.0 in | Wt 170.2 lb

## 2021-11-22 DIAGNOSIS — C8380 Other non-follicular lymphoma, unspecified site: Secondary | ICD-10-CM

## 2021-11-22 DIAGNOSIS — C884 Extranodal marginal zone B-cell lymphoma of mucosa-associated lymphoid tissue [MALT-lymphoma]: Secondary | ICD-10-CM | POA: Insufficient documentation

## 2021-11-22 MED ORDER — COVID-19 MRNA VAC-TRIS(PFIZER) 30 MCG/0.3ML IM SUSY
0.3000 mL | PREFILLED_SYRINGE | Freq: Once | INTRAMUSCULAR | 0 refills | Status: AC
  Filled 2021-11-22: qty 0.3, 1d supply, fill #0

## 2021-11-22 NOTE — Progress Notes (Signed)
  [facility] OFFICE PROGRESS NOTE   Diagnosis: MALT lymphoma  INTERVAL HISTORY:   Mr Brad May returns as scheduled.  He feels well.  No palpable lymph nodes.  No fever.  No consistent night sweat.  Occasional cough.  No difficulty with bowel function.  He underwent a colonoscopy 03/16/2021 by Dr. Hilarie Fredrickson. Polyps were removed from the ascending and transverse colon.  The pathology revealed tubular adenomas without high-grade dysplasia. Objective:  Vital signs in last 24 hours:  Blood pressure 130/89, pulse 78, temperature 98.2 F (36.8 C), temperature source Oral, resp. rate 20, height '5\' 8"'$  (1.727 m), weight 170 lb 3.2 oz (77.2 kg), SpO2 99 %.    Lymphatics: No cervical, supraclavicular, axillary, or inguinal nodes Resp: Lungs with mild end expiratory wheeze at the lower posterior chest bilaterally, no respiratory distress Cardio: Regular rate and rhythm GI: No hepatosplenomegaly, no mass, nontender Vascular: No leg edema  Lab Results:  Lab Results  Component Value Date   WBC 6.5 08/17/2021   HGB 16.4 08/17/2021   HCT 47.4 08/17/2021   MCV 91 08/17/2021   PLT 212 08/17/2021   NEUTROABS 3.3 08/17/2021    CMP  Lab Results  Component Value Date   NA 139 08/17/2021   K 4.8 08/17/2021   CL 103 08/17/2021   CO2 22 08/17/2021   GLUCOSE 114 (H) 08/17/2021   BUN 18 08/17/2021   CREATININE 0.99 08/17/2021   CALCIUM 9.7 08/17/2021   PROT 6.9 08/17/2021   ALBUMIN 4.4 08/17/2021   AST 26 08/17/2021   ALT 22 08/17/2021   ALKPHOS 135 (H) 08/17/2021   BILITOT 1.0 08/17/2021   GFRNONAA 80 01/26/2020   GFRAA 93 01/26/2020     Medications: I have reviewed the patient's current medications.   Assessment/Plan:  History of intermittent rectal bleeding.  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   CT of the abdomen and pelvis 08/04/2010 with abdominal/pelvic lymphadenopathy. PET scan 09/13/2010 confirming mildly enlarged abdominal/pelvic lymph nodes with borderline increased FDG activity. 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 Cycle 1 bendamustine/rituximab 05/01/2011 Cycle 2 bendamustine/rituximab 05/29/2011 Cycle 3 bendamustine/rituximab 06/26/2011 Restaging PET scan 07/27/2011-no evidence of lymphoma Restaging sigmoidoscopy 10/03/2011-normal colonic mucosa Colonoscopy 05/22/2017- multiple polyps removed, tubular adenomas Colonoscopy 03/16/2021-polyps removed from the ascending and transverse colon-tubular adenomas History of iron deficiency. Ferritin was in the low normal range on 11/14/2010. Hemoglobin remains normal. ? left upper lung nodule on a chest x-ray 88/50/2774-[id number] hypermetabolic lung nodule was seen on a PET scan 09/13/2010 12.  [facility] spotted fever August 2021        Disposition: Mr Goerner is in clinical remission from [place].  He would like to continue follow-up at the Cancer center.  He will return for an office visit in 1 year.  He plans to obtain a COVID-19 vaccine today.  He is up-to-date on influenza and pneumococcal vaccines.  Betsy Coder, MD  11/22/2021  11:44 AM

## 2022-02-20 ENCOUNTER — Ambulatory Visit (INDEPENDENT_AMBULATORY_CARE_PROVIDER_SITE_OTHER): Payer: Medicare Other | Admitting: Family Medicine

## 2022-02-20 ENCOUNTER — Encounter: Payer: Self-pay | Admitting: Family Medicine

## 2022-02-20 VITALS — BP 130/80 | HR 72 | Temp 98.1°F | Ht 68.0 in | Wt 173.2 lb

## 2022-02-20 DIAGNOSIS — E559 Vitamin D deficiency, unspecified: Secondary | ICD-10-CM

## 2022-02-20 DIAGNOSIS — R739 Hyperglycemia, unspecified: Secondary | ICD-10-CM | POA: Diagnosis not present

## 2022-02-20 DIAGNOSIS — Z23 Encounter for immunization: Secondary | ICD-10-CM | POA: Diagnosis not present

## 2022-02-20 DIAGNOSIS — E034 Atrophy of thyroid (acquired): Secondary | ICD-10-CM | POA: Diagnosis not present

## 2022-02-20 DIAGNOSIS — E782 Mixed hyperlipidemia: Secondary | ICD-10-CM

## 2022-02-20 DIAGNOSIS — R030 Elevated blood-pressure reading, without diagnosis of hypertension: Secondary | ICD-10-CM

## 2022-02-20 MED ORDER — DOXYCYCLINE MONOHYDRATE 50 MG PO CAPS: 50.0000 mg | ORAL_CAPSULE | Freq: Every day | ORAL | Status: DC

## 2022-02-20 NOTE — Progress Notes (Signed)
Subjective:  Patient ID: Brad May, male    DOB: 1940/05/26  Age: 82 y.o. MRN: [id number]  CC: Medical Management of Chronic Issues   HPI Brad May presents for  follow-up on  thyroid. The patient has a history of hypothyroidism for many years. It has been stable recently. Pt. denies any change in  voice, loss of hair, heat or cold intolerance. Energy level has been adequate to good. Patient denies constipation and diarrhea. No myxedema. Medication is as noted below. Verified that pt is taking it daily on an empty stomach. Well tolerated.   in for follow-up of elevated cholesterol. Doing well without complaints on current medication. Denies side effects of statin including myalgia and arthralgia and nausea. Currently no chest pain, shortness of breath or other cardiovascular related symptoms noted.  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  Patient also has deficiency vitamin D and due for blood test for that today.      02/20/2022    9:06 AM 08/17/2021    9:21 AM 07/26/2021    1:24 PM  Depression screen PHQ 2/9  Decreased Interest 0 0 0  Down, Depressed, Hopeless 0 0 0  PHQ - 2 Score 0 0 0    History Brad May has a past medical history of BPH (benign prostatic hyperplasia), Duodenitis, Hyperlipidemia, Non Hodgkin's lymphoma ([place]) (24/09/7351), Periumbilical hernia, Thyroid disease, and Tubular adenoma of colon.   He has a past surgical history that includes Appendectomy; Inguinal hernia repair (2006); Colonoscopy w/ polypectomy; Colonoscopy; and Polypectomy.   His family history includes COPD in his brother; Dementia in his paternal grandfather; Heart attack (age of onset: 57) in his father; Heart disease in his father and mother; Heart failure in his mother; Suicidality in his daughter.He reports that he has never smoked. He has never used smokeless tobacco. He reports current  alcohol use. He reports that he does not use drugs.    ROS Review of Systems  Constitutional:  Negative for fever.  Respiratory:  Negative for shortness of breath.   Cardiovascular:  Negative for chest pain.  Musculoskeletal:  Negative for arthralgias.  Skin:  Negative for rash.    Objective:  BP 130/80   Pulse 72   Temp 98.1 F (36.7 C)   Ht '5\' 8"'$  (1.727 m)   Wt 173 lb 3.2 oz (78.6 kg)   SpO2 97%   BMI 26.33 kg/m   BP Readings from Last 3 Encounters:  02/20/22 130/80  11/22/21 130/89  08/17/21 117/77    Wt Readings from Last 3 Encounters:  02/20/22 173 lb 3.2 oz (78.6 kg)  11/22/21 170 lb 3.2 oz (77.2 kg)  08/17/21 169 lb 3.2 oz (76.7 kg)     Physical Exam Vitals reviewed.  Constitutional:      Appearance: He is well-developed.  HENT:     Head: Normocephalic and atraumatic.     Right Ear: External ear normal.     Left Ear: External ear normal.     Mouth/Throat:     Pharynx: No oropharyngeal exudate or posterior oropharyngeal erythema.  Eyes:     Pupils: Pupils are equal, round, and reactive to light.  Cardiovascular:     Rate and Rhythm: Normal rate and regular rhythm.     Heart sounds: No murmur heard. Pulmonary:     Effort: No respiratory distress.     Breath sounds: Normal breath sounds.  Musculoskeletal:     Cervical back: Normal range of motion and neck supple.  Neurological:     Mental Status: He is alert and oriented to person, place, and time.       Assessment & Plan:   Brad May was seen today for medical management of chronic issues.  Diagnoses and all orders for this visit:  Hypothyroidism due to acquired atrophy of thyroid -     TSH + free T4  Mixed hyperlipidemia -     Lipid panel  Elevated blood-pressure reading, without diagnosis of hypertension -     CBC with Differential/Platelet -     CMP14+EGFR  Vitamin D deficiency -     VITAMIN D 25 Hydroxy (Vit-D Deficiency, Fractures)  Other orders -     doxycycline (MONODOX) 50  MG capsule; Take 1 capsule (50 mg total) by mouth daily.       I have discontinued Brad John A. Ballengee "[name]"'s nystatin. I have also changed his doxycycline. Additionally, I am having him maintain his aspirin, fish oil-omega-3 fatty acids, b complex vitamins, Cholecalciferol, Probiotic Product (PROBIOTIC-10 PO), sildenafil, levothyroxine, and atorvastatin. We will continue to administer methylPREDNISolone acetate.  Allergies as of 02/20/2022       Reactions   Sulfonamide Derivatives    REACTION: unknown        Medication List        Accurate as of February 20, 2022  9:44 AM. If you have any questions, ask your nurse or doctor.          STOP taking these medications    nystatin 100000 UNIT/ML suspension Commonly known as: MYCOSTATIN Stopped by: Claretta Fraise, MD       TAKE these medications    aspirin 81 MG tablet Take 81 mg by mouth daily.   atorvastatin 80 MG tablet Commonly known as: LIPITOR TAKE 1 TABLET BY MOUTH EVERY DAY   b complex vitamins tablet Take 1 tablet by mouth daily.   Cholecalciferol 25 MCG (1000 UT) tablet Take 2,000 Units by mouth daily.   doxycycline 50 MG capsule Commonly known as: MONODOX Take 1 capsule (50 mg total) by mouth daily. What changed: when to take this Changed by: Claretta Fraise, MD   fish oil-omega-3 fatty acids 1000 MG capsule Take 1 g by mouth daily.   levothyroxine 88 MCG tablet Commonly known as: SYNTHROID Take 1 tablet (88 mcg total) by mouth daily.   PROBIOTIC-10 PO Take 1 capsule by mouth daily.   sildenafil 20 MG tablet Commonly known as: REVATIO TAKE 2-5 TABLETS BY MOUTH AS NEEDED FOR SEXUAL ACTIVITY         Follow-up: No follow-ups on file.  Claretta Fraise, M.D.

## 2022-02-21 LAB — CMP14+EGFR
ALT: 31 IU/L (ref 0–44)
AST: 24 IU/L (ref 0–40)
Albumin/Globulin Ratio: 2 (ref 1.2–2.2)
Albumin: 4.6 g/dL (ref 3.7–4.7)
Alkaline Phosphatase: 148 IU/L — ABNORMAL HIGH (ref 44–121)
BUN/Creatinine Ratio: 19 (ref 10–24)
BUN: 15 mg/dL (ref 8–27)
Bilirubin Total: 0.7 mg/dL (ref 0.0–1.2)
CO2: 19 mmol/L — ABNORMAL LOW (ref 20–29)
Calcium: 9.5 mg/dL (ref 8.6–10.2)
Chloride: 103 mmol/L (ref 96–106)
Creatinine, Ser: 0.78 mg/dL (ref 0.76–1.27)
Globulin, Total: 2.3 g/dL (ref 1.5–4.5)
Glucose: 128 mg/dL — ABNORMAL HIGH (ref 70–99)
Potassium: 4.5 mmol/L (ref 3.5–5.2)
Sodium: 139 mmol/L (ref 134–144)
Total Protein: 6.9 g/dL (ref 6.0–8.5)
eGFR: 89 mL/min/{1.73_m2} (ref >59)

## 2022-02-21 LAB — LIPID PANEL
Chol/HDL Ratio: 4 ratio (ref 0.0–5.0)
Cholesterol, Total: 173 mg/dL (ref 100–199)
HDL: 43 mg/dL (ref >39)
LDL Chol Calc (NIH): 111 mg/dL — ABNORMAL HIGH (ref 0–99)
Triglycerides: 105 mg/dL (ref 0–149)
VLDL Cholesterol Cal: 19 mg/dL (ref 5–40)

## 2022-02-21 LAB — CBC WITH DIFFERENTIAL/PLATELET
Basophils Absolute: 0.1 10*3/uL (ref 0.0–0.2)
Basos: 1 %
EOS (ABSOLUTE): 0.3 10*3/uL (ref 0.0–0.4)
Eos: 5 %
Hematocrit: 46.3 % (ref 37.5–51.0)
Hemoglobin: 16.6 g/dL (ref 13.0–17.7)
Immature Grans (Abs): 0 10*3/uL (ref 0.0–0.1)
Immature Granulocytes: 0 %
Lymphocytes Absolute: 2.1 10*3/uL (ref 0.7–3.1)
Lymphs: 32 %
MCH: 32.8 pg (ref 26.6–33.0)
MCHC: 35.9 g/dL — ABNORMAL HIGH (ref 31.5–35.7)
MCV: 92 fL (ref 79–97)
Monocytes Absolute: 0.7 10*3/uL (ref 0.1–0.9)
Monocytes: 11 %
Neutrophils Absolute: 3.2 10*3/uL (ref 1.4–7.0)
Neutrophils: 51 %
Platelets: 208 10*3/uL (ref 150–450)
RBC: 5.06 x10E6/uL (ref 4.14–5.80)
RDW: 12.2 % (ref 11.6–15.4)
WBC: 6.3 10*3/uL (ref 3.4–10.8)

## 2022-02-21 LAB — VITAMIN D 25 HYDROXY (VIT D DEFICIENCY, FRACTURES): Vit D, 25-Hydroxy: 34.7 ng/mL (ref 30.0–100.0)

## 2022-02-21 LAB — TSH+FREE T4
Free T4: 1.65 ng/dL (ref 0.82–1.77)
TSH: 2.12 u[IU]/mL (ref 0.450–4.500)

## 2022-02-23 LAB — HGB A1C W/O EAG: Hgb A1c MFr Bld: 6.4 % — ABNORMAL HIGH (ref 4.8–5.6)

## 2022-02-23 LAB — SPECIMEN STATUS REPORT

## 2022-03-20 DIAGNOSIS — L821 Other seborrheic keratosis: Secondary | ICD-10-CM | POA: Diagnosis not present

## 2022-03-20 DIAGNOSIS — K1379 Other lesions of oral mucosa: Secondary | ICD-10-CM | POA: Diagnosis not present

## 2022-03-20 DIAGNOSIS — K1329 Other disturbances of oral epithelium, including tongue: Secondary | ICD-10-CM | POA: Diagnosis not present

## 2022-03-20 DIAGNOSIS — L719 Rosacea, unspecified: Secondary | ICD-10-CM | POA: Diagnosis not present

## 2022-03-20 DIAGNOSIS — L578 Other skin changes due to chronic exposure to nonionizing radiation: Secondary | ICD-10-CM | POA: Diagnosis not present

## 2022-03-20 DIAGNOSIS — B009 Herpesviral infection, unspecified: Secondary | ICD-10-CM | POA: Diagnosis not present

## 2022-03-20 DIAGNOSIS — L905 Scar conditions and fibrosis of skin: Secondary | ICD-10-CM | POA: Diagnosis not present

## 2022-03-20 DIAGNOSIS — Z85828 Personal history of other malignant neoplasm of skin: Secondary | ICD-10-CM | POA: Diagnosis not present

## 2022-07-03 DIAGNOSIS — H52223 Regular astigmatism, bilateral: Secondary | ICD-10-CM | POA: Diagnosis not present

## 2022-07-03 DIAGNOSIS — H25813 Combined forms of age-related cataract, bilateral: Secondary | ICD-10-CM | POA: Diagnosis not present

## 2022-07-03 DIAGNOSIS — H43813 Vitreous degeneration, bilateral: Secondary | ICD-10-CM | POA: Diagnosis not present

## 2022-07-03 DIAGNOSIS — H524 Presbyopia: Secondary | ICD-10-CM | POA: Diagnosis not present

## 2022-07-03 DIAGNOSIS — H5213 Myopia, bilateral: Secondary | ICD-10-CM | POA: Diagnosis not present

## 2022-08-17 ENCOUNTER — Ambulatory Visit (INDEPENDENT_AMBULATORY_CARE_PROVIDER_SITE_OTHER): Payer: Medicare Other

## 2022-08-17 VITALS — Ht 68.0 in | Wt 168.0 lb

## 2022-08-17 DIAGNOSIS — Z0000 Encounter for general adult medical examination without abnormal findings: Secondary | ICD-10-CM

## 2022-08-17 NOTE — Patient Instructions (Signed)
[name] , Thank you for taking time to come for your Medicare Wellness Visit. I appreciate your ongoing commitment to your health goals. Please review the following plan we discussed and let me know if I can assist you in the future.   Referrals/Orders/Follow-Ups/Clinician Recommendations: Aim for 30 minutes of exercise or brisk walking, 6-8 glasses of water, and 5 servings of fruits and vegetables each day.   This is a list of the screening recommended for you and due dates:  Health Maintenance  Topic Date Due   DTaP/Tdap/Td vaccine (2 - Td or Tdap) 11/14/2021   COVID-19 Vaccine (5 - 2023-24 season) 01/17/2022   Flu Shot  08/16/2022   Medicare Annual Wellness Visit  08/17/2023   Pneumonia Vaccine  Completed   Zoster (Shingles) Vaccine  Completed   HPV Vaccine  Aged Out   Colon Cancer Screening  Discontinued   Hepatitis C Screening  Discontinued    Advanced directives: (Provided) Advance directive discussed with you today. I have provided a copy for you to complete at home and have notarized. Once this is complete, please bring a copy in to our office so we can scan it into your chart. Information on Advanced Care Planning can be found at [facility] Advance Health Care Directives Advance Health Care Directives ([email or web address]    Next Medicare Annual Wellness Visit scheduled for next year: Yes  Preventive Care 65 Years and Older, Male  Preventive care refers to lifestyle choices and visits with your health care provider that can promote health and wellness. What does preventive care include? A yearly physical exam. This is also called an annual well check. Dental exams once or twice a year. Routine eye exams. Ask your health care provider how often you should have your eyes checked. Personal lifestyle choices, including: Daily care of your teeth and gums. Regular physical activity. Eating a healthy diet. Avoiding tobacco and drug use. Limiting alcohol  use. Practicing safe sex. Taking low doses of aspirin every day. Taking vitamin and mineral supplements as recommended by your health care provider. What happens during an annual well check? The services and screenings done by your health care provider during your annual well check will depend on your age, overall health, lifestyle risk factors, and family history of disease. Counseling  Your health care provider may ask you questions about your: Alcohol use. Tobacco use. Drug use. Emotional well-being. Home and relationship well-being. Sexual activity. Eating habits. History of falls. Memory and ability to understand (cognition). Work and work Astronomer. Screening  You may have the following tests or measurements: Height, weight, and BMI. Blood pressure. Lipid and cholesterol levels. These may be checked every 5 years, or more frequently if you are over 66 years old. Skin check. Lung cancer screening. You may have this screening every year starting at age 80 if you have a 30-pack-year history of smoking and currently smoke or have quit within the past 15 years. Fecal occult blood test (FOBT) of the stool. You may have this test every year starting at age 32. Flexible sigmoidoscopy or colonoscopy. You may have a sigmoidoscopy every 5 years or a colonoscopy every 10 years starting at age 24. Prostate cancer screening. Recommendations will vary depending on your family history and other risks. Hepatitis C blood test. Hepatitis B blood test. Sexually transmitted disease (STD) testing. Diabetes screening. This is done by checking your blood sugar (glucose) after you have not eaten for a while (fasting). You may have this done  every 1-3 years. Abdominal aortic aneurysm (AAA) screening. You may need this if you are a current or former smoker. Osteoporosis. You may be screened starting at age 68 if you are at high risk. Talk with your health care provider about your test results,  treatment options, and if necessary, the need for more tests. Vaccines  Your health care provider may recommend certain vaccines, such as: Influenza vaccine. This is recommended every year. Tetanus, diphtheria, and acellular pertussis (Tdap, Td) vaccine. You may need a Td booster every 10 years. Zoster vaccine. You may need this after age 70. Pneumococcal 13-valent conjugate (PCV13) vaccine. One dose is recommended after age 69. Pneumococcal polysaccharide (PPSV23) vaccine. One dose is recommended after age 50. Talk to your health care provider about which screenings and vaccines you need and how often you need them. This information is not intended to replace advice given to you by your health care provider. Make sure you discuss any questions you have with your health care provider. Document Released: 01/28/2015 Document Revised: 09/21/2015 Document Reviewed: 11/02/2014 Elsevier Interactive Patient Education  2017 [employer].  Fall Prevention in the Home Falls can cause injuries. They can happen to people of all ages. There are many things you can do to make your home safe and to help prevent falls. What can I do on the outside of my home? Regularly fix the edges of walkways and driveways and fix any cracks. Remove anything that might make you trip as you walk through a door, such as a raised step or threshold. Trim any bushes or trees on the path to your home. Use bright outdoor lighting. Clear any walking paths of anything that might make someone trip, such as rocks or tools. Regularly check to see if handrails are loose or broken. Make sure that both sides of any steps have handrails. Any raised decks and porches should have guardrails on the edges. Have any leaves, snow, or ice cleared regularly. Use sand or salt on walking paths during winter. Clean up any spills in your garage right away. This includes oil or grease spills. What can I do in the bathroom? Use night  lights. Install grab bars by the toilet and in the tub and shower. Do not use towel bars as grab bars. Use non-skid mats or decals in the tub or shower. If you need to sit down in the shower, use a plastic, non-slip stool. Keep the floor dry. Clean up any water that spills on the floor as soon as it happens. Remove soap buildup in the tub or shower regularly. Attach bath mats securely with double-sided non-slip rug tape. Do not have throw rugs and other things on the floor that can make you trip. What can I do in the bedroom? Use night lights. Make sure that you have a light by your bed that is easy to reach. Do not use any sheets or blankets that are too big for your bed. They should not hang down onto the floor. Have a firm chair that has side arms. You can use this for support while you get dressed. Do not have throw rugs and other things on the floor that can make you trip. What can I do in the kitchen? Clean up any spills right away. Avoid walking on wet floors. Keep items that you use a lot in easy-to-reach places. If you need to reach something above you, use a strong step stool that has a grab bar. Keep electrical cords out of the  way. Do not use floor polish or wax that makes floors slippery. If you must use wax, use non-skid floor wax. Do not have throw rugs and other things on the floor that can make you trip. What can I do with my stairs? Do not leave any items on the stairs. Make sure that there are handrails on both sides of the stairs and use them. Fix handrails that are broken or loose. Make sure that handrails are as long as the stairways. Check any carpeting to make sure that it is firmly attached to the stairs. Fix any carpet that is loose or worn. Avoid having throw rugs at the top or bottom of the stairs. If you do have throw rugs, attach them to the floor with carpet tape. Make sure that you have a light switch at the top of the stairs and the bottom of the stairs. If  you do not have them, ask someone to add them for you. What else can I do to help prevent falls? Wear shoes that: Do not have high heels. Have rubber bottoms. Are comfortable and fit you well. Are closed at the toe. Do not wear sandals. If you use a stepladder: Make sure that it is fully opened. Do not climb a closed stepladder. Make sure that both sides of the stepladder are locked into place. Ask someone to hold it for you, if possible. Clearly mark and make sure that you can see: Any grab bars or handrails. First and last steps. Where the edge of each step is. Use tools that help you move around (mobility aids) if they are needed. These include: Canes. Walkers. Scooters. Crutches. Turn on the lights when you go into a dark area. Replace any light bulbs as soon as they burn out. Set up your furniture so you have a clear path. Avoid moving your furniture around. If any of your floors are uneven, fix them. If there are any pets around you, be aware of where they are. Review your medicines with your doctor. Some medicines can make you feel dizzy. This can increase your chance of falling. Ask your doctor what other things that you can do to help prevent falls. This information is not intended to replace advice given to you by your health care provider. Make sure you discuss any questions you have with your health care provider. Document Released: 10/28/2008 Document Revised: 06/09/2015 Document Reviewed: 02/05/2014 Elsevier Interactive Patient Education  2017 [employer].

## 2022-08-17 NOTE — Progress Notes (Signed)
Subjective:   Brad May is a 82 y.o. male who presents for Medicare Annual/Subsequent preventive examination.  Visit Complete: Virtual  I connected with  Julien Nordmann Mask on 08/17/22 by a audio enabled telemedicine application and verified that I am speaking with the correct person using two identifiers.  Patient Location: Home  Provider Location: Home Office  I discussed the limitations of evaluation and management by telemedicine. The patient expressed understanding and agreed to proceed.  Patient Medicare AWV questionnaire was completed by the patient on 08/17/2022; I have confirmed that all information answered by patient is correct and no changes since this date.  Review of Systems    Vital Signs: Unable to obtain new vitals due to this being a telehealth visit.        Objective:    Today's Vitals   08/17/22 0834  Weight: 168 lb (76.2 kg)  Height: 5\' 8"  (1.727 m)   Body mass index is 25.54 kg/m.     11/22/2021   11:30 AM 07/26/2021    1:28 PM 07/25/2020    1:43 PM 06/30/2018   10:37 AM 11/18/2017   12:16 PM 06/03/2017    8:57 AM 05/25/2016    9:04 AM  Advanced Directives  Does Patient Have a Medical Advance Directive? Yes Yes Yes No No;Yes Yes Yes  Type of Estate agent of [place];Living will Healthcare Power of [place];Living will Healthcare Power of [place];Living will  Healthcare Power of [place];Living will Living will Living will  Does patient want to make changes to medical advance directive? No - Patient declined    No - Patient declined    Copy of Healthcare Power of Attorney in Chart? No - copy requested No - copy requested No - copy requested  No - copy requested    Would patient like information on creating a medical advance directive?    Yes (MAU/Ambulatory/Procedural Areas - Information given)       Current Medications (verified) Outpatient Encounter Medications as of 08/17/2022  Medication Sig   aspirin 81 MG tablet Take  81 mg by mouth daily.   atorvastatin (LIPITOR) 80 MG tablet TAKE 1 TABLET BY MOUTH EVERY DAY   b complex vitamins tablet Take 1 tablet by mouth daily.   Cholecalciferol 25 MCG (1000 UT) tablet Take 2,000 Units by mouth daily.    doxycycline (MONODOX) 50 MG capsule Take 1 capsule (50 mg total) by mouth daily.   fish oil-omega-3 fatty acids 1000 MG capsule Take 1 g by mouth daily.   levothyroxine (SYNTHROID) 88 MCG tablet Take 1 tablet (88 mcg total) by mouth daily.   Probiotic Product (PROBIOTIC-10 PO) Take 1 capsule by mouth daily.    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   Periumbilical hernia    Thyroid disease    Tubular adenoma of colon    Past Surgical History:  Procedure Laterality Date   APPENDECTOMY     COLONOSCOPY     COLONOSCOPY W/ POLYPECTOMY     INGUINAL HERNIA REPAIR  2006   left   POLYPECTOMY     Family History  Problem Relation  Age of Onset   Heart disease Mother    Heart failure Mother        age 26   Heart disease Father    Heart attack Father 68   COPD Brother    Suicidality Daughter    Dementia Paternal Grandfather    Colon cancer Neg Hx    Stomach cancer Neg Hx    Esophageal cancer Neg Hx    Liver cancer Neg Hx    Pancreatic cancer Neg Hx    Prostate cancer Neg Hx    Rectal cancer Neg Hx    Ulcerative colitis Neg Hx    Social History   Socioeconomic History   Marital status: Married    Spouse name: Augusto Gamble   Number of children: 2   Years of education: Not on file   Highest education level: Doctorate  Occupational History   Occupation: High school  Principal    Comment: retired  Tobacco Use   Smoking status: Never   Smokeless tobacco: Never  Vaping Use   Vaping status: Never Used  Substance and Sexual Activity   Alcohol use: Yes    Comment: 1-2 month   Drug use: No   Sexual activity: Yes  Other Topics Concern   Not on file  Social History Narrative   Lives at home with wife.  Two children.  Daughter died suicide.  Two grands.  Retired Programmer, systems.     Son and grandson live close by.   Enjoys golfing,    Social Determinants of Health   Financial Resource Strain: Low Risk  (08/17/2022)   Overall Financial Resource Strain (CARDIA)    Difficulty of Paying Living Expenses: Not hard at all  Food Insecurity: No Food Insecurity (08/17/2022)   Hunger Vital Sign    Worried About Running Out of Food in the Last Year: Never true    Ran Out of Food in the Last Year: Never true  Transportation Needs: No Transportation Needs (08/17/2022)   PRAPARE - Administrator, Civil Service (Medical): No    Lack of Transportation (Non-Medical): No  Physical Activity: Sufficiently Active (08/17/2022)   Exercise Vital Sign    Days of Exercise per Week: 3 days    Minutes of Exercise per Session: 60 min  Stress: No Stress Concern Present (08/17/2022)   [employer] of Occupational Health - Occupational Stress Questionnaire    Feeling of Stress : Not at all  Social Connections: Socially Integrated (08/17/2022)   Social Connection and Isolation Panel [NHANES]    Frequency of Communication with Friends and Family: More than three times a week    Frequency of Social Gatherings with Friends and Family: More than three times a week    Attends Religious Services: More than 4 times per year    Active Member of [employer] or Organizations: Yes    Attends Engineer, structural: More than 4 times per year    Marital Status: Married    Tobacco Counseling Counseling given: Not Answered   Clinical Intake:  Pre-visit preparation completed:  Yes  Pain : No/denies pain     Nutritional Risks: None Diabetes: No  How often do you need to have someone help you when you read instructions, pamphlets, or other written materials from your doctor or pharmacy?: 1 - Never  Interpreter Needed?: No  Information entered by :: Renie Ora, LPN   Activities of Daily Living    08/16/2022   10:01 PM  In your present state of  health, do you have any difficulty performing the following activities:  Hearing? 0  Vision? 0  Difficulty concentrating or making decisions? 0  Walking or climbing stairs? 0  Dressing or bathing? 0  Doing errands, shopping? 0  Preparing Food and eating ? N  Using the Toilet? N  In the past six months, have you accidently leaked urine? N  Do you have problems with loss of bowel control? N  Managing your Medications? N  Managing your Finances? N  Housekeeping or managing your Housekeeping? N    Patient Care Team: [name], MD as PCP - General (Family Medicine) Pyrtle, Carie Caddy, MD as Consulting Physician (Gastroenterology) Ladene Artist, MD as Consulting Physician (Oncology)  Indicate any recent Medical Services you may have received from other than [facility] providers in the past year (date may be approximate).     Assessment:   This is a routine wellness examination for Travor.  Hearing/Vision screen Vision Screening - Comments:: Wears rx glasses - up to date with routine eye exams with  Dr.Ali  Dietary issues and exercise activities discussed:     Goals Addressed   None    Depression Screen    08/17/2022    8:36 AM 02/20/2022    9:06 AM 08/17/2021    9:21 AM 07/26/2021    1:24 PM 01/26/2021    9:56 AM 07/26/2020   10:02 AM 07/25/2020    1:34 PM  PHQ 2/9 Scores  PHQ - 2 Score 0 0 0 0 0 0 0    Fall Risk    08/17/2022    8:35 AM 08/16/2022   10:01 PM 02/20/2022    9:06 AM 08/17/2021    9:22 AM 08/17/2021    9:21 AM  Fall Risk   Falls in the past year? 0 0 0 0 0  Number falls in past yr: 0 0      Injury with Fall? 0 0     Risk for fall due to : No Fall Risks      Follow up Falls prevention discussed        MEDICARE RISK AT HOME:  Medicare Risk at Home - 08/17/22 0835     Any stairs in or around the home? Yes    If so, are there any without handrails? No    Home free of loose throw rugs in walkways, pet beds, electrical cords, etc? Yes    Adequate lighting in your home to reduce risk of falls? Yes    Life alert? No    Use of a cane, walker or w/c? No    Grab bars in the bathroom? Yes    Shower chair or bench in shower? Yes    Elevated toilet seat or a handicapped toilet? Yes             TIMED UP AND GO:  Was the test performed?  No    Cognitive Function:    06/03/2017    8:59 AM 05/25/2016    9:08 AM 10/21/2014    3:34 PM  MMSE - Mini Mental State Exam  Orientation to time 5 5 4   Orientation to Place 5 5 5   Registration 3 3 3   Attention/ Calculation 4 5 5   Recall 3 3 3   Language- name 2 objects 2 2 2   Language- repeat 1 1 1   Language- follow 3 step command 3 3 3   Language- read & follow direction 1 1 1   Write a sentence 1 1 1  Copy design 1 1 1   Total score 29 30 29         07/26/2021    1:28 PM 06/30/2018   10:39 AM  6CIT Screen  What Year? 0 points 0 points  What month? 0 points 0 points  What time? 0 points 0 points  Count back from 20 0 points 0 points  Months in reverse 0 points 0 points  Repeat phrase 0 points 0 points  Total Score 0 points 0 points    Immunizations Immunization History  Administered Date(s) Administered   COVID-19, mRNA, vaccine(Comirnaty)12 years and older 11/22/2021   Fluad Quad(high Dose 65+) 10/06/2018, 12/30/2020   Influenza, High Dose Seasonal PF 11/09/2016, 11/18/2017, 11/02/2019   Influenza,inj,Quad PF,6+ Mos 10/21/2013, 10/21/2014, 11/11/2015   Influenza-Unspecified 10/06/2018, 10/24/2021   Moderna Covid-19 Vaccine Bivalent Booster 32yrs & up 01/18/2021   Moderna Sars-Covid-2 Vaccination 11/08/2019, 06/09/2020    Pneumococcal Conjugate-13 12/17/2012   Pneumococcal Polysaccharide-23 11/15/2004, 11/20/2018   Tdap 11/15/2011   Zoster Recombinant(Shingrix) 08/17/2021, 02/20/2022   Zoster, Live 07/10/2006    TDAP status: Due, Education has been provided regarding the importance of this vaccine. Advised may receive this vaccine at local pharmacy or Health Dept. Aware to provide a copy of the vaccination record if obtained from local pharmacy or Health Dept. Verbalized acceptance and understanding.  Flu Vaccine status: Up to date  Pneumococcal vaccine status: Up to date  Covid-19 vaccine status: Completed vaccines  Qualifies for Shingles Vaccine? Yes   Zostavax completed Yes   Shingrix Completed?: Yes  Screening Tests Health Maintenance  Topic Date Due   DTaP/Tdap/Td (2 - Td or Tdap) 11/14/2021   COVID-19 Vaccine (5 - 2023-24 season) 01/17/2022   INFLUENZA VACCINE  08/16/2022   Medicare Annual Wellness (AWV)  08/17/2023   Pneumonia Vaccine 52+ Years old  Completed   Zoster Vaccines- Shingrix  Completed   HPV VACCINES  Aged Out   Colonoscopy  Discontinued   Hepatitis C Screening  Discontinued    Health Maintenance  Health Maintenance Due  Topic Date Due   DTaP/Tdap/Td (2 - Td or Tdap) 11/14/2021   COVID-19 Vaccine (5 - 2023-24 season) 01/17/2022   INFLUENZA VACCINE  08/16/2022    Colorectal cancer screening: No longer required.   Lung Cancer Screening: (Low Dose CT Chest recommended if Age 63-80 years, 20 pack-year currently smoking OR have quit w/in 15years.) does not qualify.   Lung Cancer Screening Referral: n/a  Additional Screening:  Hepatitis C Screening: does not qualify;   Vision Screening: Recommended annual ophthalmology exams for early detection of glaucoma and other disorders of the eye. Is the patient up to date with their annual eye exam?  Yes  Who is the provider or what is the name of the office in which the patient attends annual eye exams? Dr.ALI If pt is  not established with a provider, would they like to be referred to a provider to establish care? No .   Dental Screening: Recommended annual dental exams for proper oral hygiene    Community Resource Referral / Chronic Care Management: CRR required this visit?  No   CCM required this visit?  No     Plan:     I have personally reviewed and noted the following in the patient's chart:   Medical and social history Use of alcohol, tobacco or illicit drugs  Current medications and supplements including opioid prescriptions. Patient is not currently taking opioid prescriptions. Functional ability and status Nutritional status Physical activity Advanced directives  List of other physicians Hospitalizations, surgeries, and ER visits in previous 12 months Vitals Screenings to include cognitive, depression, and falls Referrals and appointments  In addition, I have reviewed and discussed with patient certain preventive protocols, quality metrics, and best practice recommendations. A written personalized care plan for preventive services as well as general preventive health recommendations were provided to patient.     Lorrene Reid, LPN   01/20/1094   After Visit Summary: (MyChart) Due to this being a telephonic visit, the after visit summary with patients personalized plan was offered to patient via MyChart   Nurse Notes: Due TDAp Vaccine

## 2022-08-21 ENCOUNTER — Encounter: Payer: Medicare Other | Admitting: Family Medicine

## 2022-08-27 ENCOUNTER — Ambulatory Visit (INDEPENDENT_AMBULATORY_CARE_PROVIDER_SITE_OTHER): Payer: Medicare Other | Admitting: Family Medicine

## 2022-08-27 ENCOUNTER — Encounter: Payer: Self-pay | Admitting: Family Medicine

## 2022-08-27 VITALS — BP 146/86 | HR 67 | Temp 98.1°F | Ht 68.0 in | Wt 169.6 lb

## 2022-08-27 DIAGNOSIS — R7303 Prediabetes: Secondary | ICD-10-CM | POA: Diagnosis not present

## 2022-08-27 DIAGNOSIS — E034 Atrophy of thyroid (acquired): Secondary | ICD-10-CM

## 2022-08-27 DIAGNOSIS — Z23 Encounter for immunization: Secondary | ICD-10-CM

## 2022-08-27 DIAGNOSIS — N5201 Erectile dysfunction due to arterial insufficiency: Secondary | ICD-10-CM | POA: Diagnosis not present

## 2022-08-27 DIAGNOSIS — C8590 Non-Hodgkin lymphoma, unspecified, unspecified site: Secondary | ICD-10-CM | POA: Diagnosis not present

## 2022-08-27 DIAGNOSIS — R030 Elevated blood-pressure reading, without diagnosis of hypertension: Secondary | ICD-10-CM | POA: Diagnosis not present

## 2022-08-27 DIAGNOSIS — E559 Vitamin D deficiency, unspecified: Secondary | ICD-10-CM | POA: Diagnosis not present

## 2022-08-27 DIAGNOSIS — N400 Enlarged prostate without lower urinary tract symptoms: Secondary | ICD-10-CM

## 2022-08-27 DIAGNOSIS — E782 Mixed hyperlipidemia: Secondary | ICD-10-CM

## 2022-08-27 LAB — BAYER DCA HB A1C WAIVED: HB A1C (BAYER DCA - WAIVED): 6.1 % — ABNORMAL HIGH (ref 4.8–5.6)

## 2022-08-27 MED ORDER — ATORVASTATIN CALCIUM 80 MG PO TABS: 80.0000 mg | ORAL_TABLET | Freq: Every day | ORAL | 3 refills | Status: DC

## 2022-08-27 MED ORDER — SILDENAFIL CITRATE 20 MG PO TABS
ORAL_TABLET | ORAL | 5 refills | Status: DC
Start: 2022-08-27 — End: 2023-05-28

## 2022-08-27 MED ORDER — LEVOTHYROXINE SODIUM 88 MCG PO TABS
88.0000 ug | ORAL_TABLET | Freq: Every day | ORAL | 3 refills | Status: DC
Start: 2022-08-27 — End: 2023-10-09

## 2022-08-27 NOTE — Patient Instructions (Signed)

## 2022-08-27 NOTE — Addendum Note (Signed)
Addended by: Diamantina Monks on: 08/27/2022 04:57 PM   Modules accepted: Orders

## 2022-08-27 NOTE — Progress Notes (Signed)
Subjective:  Patient ID: Brad May, male    DOB: [birth year]  Age: 82 y.o. MRN: [id number]  CC: Annual Exam   HPI Brad May presents for CPE.    in for follow-up of elevated cholesterol. Doing well without complaints on current medication. Denies side effects of statin including myalgia and arthralgia and nausea. Currently no chest pain, shortness of breath or other cardiovascular related symptoms noted.   follow-up on  thyroid. The patient has a history of hypothyroidism for many years. It has been stable recently. Pt. denies any change in  voice, loss of hair, heat or cold intolerance. Energy level has been adequate to good. Patient denies constipation and diarrhea. No myxedema. Medication is as noted below. Verified that pt is taking it daily on an empty stomach. Well tolerated.  BP usually 120's/70''s on home readings       08/27/2022    1:12 PM 08/27/2022    1:07 PM 08/17/2022    8:36 AM  Depression screen PHQ 2/9  Decreased Interest 0 0 0  Down, Depressed, Hopeless 0 0 0  PHQ - 2 Score 0 0 0  Altered sleeping 0    Tired, decreased energy 0    Change in appetite 0    Feeling bad or failure about yourself  0    Trouble concentrating 0    Moving slowly or fidgety/restless 0    Suicidal thoughts 0    PHQ-9 Score 0    Difficult doing work/chores Not difficult at all      History Kyzier has a past medical history of BPH (benign prostatic hyperplasia), Carpal tunnel syndrome (10/01/2017), Colitis (09/25/2010), Duodenitis, Hyperlipidemia, Non Hodgkin's lymphoma (HCC) (11/27/2010), Periumbilical hernia, Polyp of stomach (09/25/2010), 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   He has a past surgical history that includes Appendectomy; Inguinal hernia repair (2006); Colonoscopy w/ polypectomy; Colonoscopy; and Polypectomy.   His family history includes COPD in his brother; Dementia in his paternal grandfather; Heart attack (age of onset: 74)  in his father; Heart disease in his father and mother; Heart failure in his mother; Suicidality in his daughter.He reports that he has never smoked. He has never used smokeless tobacco. He reports current alcohol use. He reports that he does not use drugs.    ROS Review of Systems  Objective:  BP (!) 146/86   Pulse 67   Temp 98.1 F (36.7 C)   Ht 5\' 8"  (1.727 m)   Wt 169 lb 9.6 oz (76.9 kg)   SpO2 97%   BMI 25.79 kg/m   BP Readings from Last 3 Encounters:  08/27/22 (!) 146/86  02/20/22 130/80  11/22/21 130/89    Wt Readings from Last 3 Encounters:  08/27/22 169 lb 9.6 oz (76.9 kg)  08/17/22 168 lb (76.2 kg)  02/20/22 173 lb 3.2 oz (78.6 kg)     Physical Exam    Assessment & Plan:   Brad "Brad May" was seen today for annual exam.  Diagnoses and all orders for this visit:  Hypothyroidism due to acquired atrophy of thyroid -     TSH + free T4 -     levothyroxine (SYNTHROID) 88 MCG tablet; Take 1 tablet (88 mcg total) by mouth daily.  Mixed hyperlipidemia -     Lipid panel -     atorvastatin (LIPITOR) 80 MG tablet; Take 1 tablet (80 mg total) by mouth daily.  Elevated blood-pressure reading, without diagnosis of hypertension -  CBC with Differential/Platelet -     CMP14+EGFR  Prediabetes -     Bayer DCA Hb A1c Waived  Indolent lymphoma (HCC)  Benign prostatic hyperplasia, unspecified whether lower urinary tract symptoms present -     PSA, total and free  Vitamin D deficiency -     VITAMIN D 25 Hydroxy (Vit-D Deficiency, Fractures)  Erectile dysfunction due to arterial insufficiency -     PSA, total and free  Other orders -     sildenafil (REVATIO) 20 MG tablet; TAKE 2-5 TABLETS BY MOUTH AS NEEDED FOR SEXUAL ACTIVITY       I have changed Brad May "Brad May"'s atorvastatin. I am also having him maintain his aspirin, fish oil-omega-3 fatty acids, b complex vitamins, Cholecalciferol, Probiotic Product (PROBIOTIC-10 PO), doxycycline,  levothyroxine, and sildenafil. We will continue to administer methylPREDNISolone acetate.  Allergies as of 08/27/2022       Reactions   Sulfonamide Derivatives    REACTION: unknown        Medication List        Accurate as of August 27, 2022  2:09 PM. If you have any questions, ask your nurse or doctor.          aspirin 81 MG tablet Take 81 mg by mouth daily.   atorvastatin 80 MG tablet Commonly known as: LIPITOR Take 1 tablet (80 mg total) by mouth daily.   b complex vitamins tablet Take 1 tablet by mouth daily.   Cholecalciferol 25 MCG (1000 UT) tablet Take 2,000 Units by mouth daily.   doxycycline 50 MG capsule Commonly known as: MONODOX Take 1 capsule (50 mg total) by mouth daily.   fish oil-omega-3 fatty acids 1000 MG capsule Take 1 g by mouth daily.   levothyroxine 88 MCG tablet Commonly known as: SYNTHROID Take 1 tablet (88 mcg total) by mouth daily.   PROBIOTIC-10 PO Take 1 capsule by mouth daily.   sildenafil 20 MG tablet Commonly known as: REVATIO TAKE 2-5 TABLETS BY MOUTH AS NEEDED FOR SEXUAL ACTIVITY         Follow-up: No follow-ups on file.  [name], M.D.

## 2022-08-28 LAB — SPECIMEN STATUS REPORT

## 2022-08-28 LAB — PSA, TOTAL AND FREE

## 2022-08-28 LAB — VITAMIN D 25 HYDROXY (VIT D DEFICIENCY, FRACTURES)

## 2022-08-28 NOTE — Progress Notes (Signed)
Hello Brad May,  Your lab result is normal and/or stable.Some minor variations that are not significant are commonly marked abnormal, but do not represent any medical problem for you.  Best regards, Warren Stacks, M.D.

## 2022-11-23 ENCOUNTER — Inpatient Hospital Stay: Payer: Medicare Other | Attending: Oncology | Admitting: Oncology

## 2022-11-23 VITALS — BP 131/86 | HR 64 | Temp 97.9°F | Resp 18 | Ht 68.0 in | Wt 172.0 lb

## 2022-11-23 DIAGNOSIS — C8380 Other non-follicular lymphoma, unspecified site: Secondary | ICD-10-CM | POA: Diagnosis not present

## 2022-11-23 DIAGNOSIS — Z9221 Personal history of antineoplastic chemotherapy: Secondary | ICD-10-CM | POA: Diagnosis not present

## 2022-11-23 DIAGNOSIS — Z8572 Personal history of non-Hodgkin lymphomas: Secondary | ICD-10-CM | POA: Diagnosis not present

## 2022-11-23 NOTE — Progress Notes (Signed)
  [facility] OFFICE PROGRESS NOTE   Diagnosis: Non-Hodgkin's lymphoma  INTERVAL HISTORY:   [name] returns as scheduled.  He feels well.  Good appetite and energy level.  No palpable lymph nodes.  No difficulty with bowel function.  No fever or night sweats.  Objective:  Vital signs in last 24 hours:  Blood pressure 131/86, pulse 64, temperature 97.9 F (36.6 C), temperature source Temporal, resp. rate 18, height 5\' 8"  (1.727 m), weight 172 lb (78 kg), SpO2 98%.    Lymphatics: No cervical, supraclavicular, axillary, or inguinal nodes Resp: End inspiratory coarse rhonchi at the upper posterior chest bilaterally, no respiratory distress Cardio: Regular rate and rhythm GI: No hepatosplenomegaly, no mass, nontender Vascular: No leg edema  Lab Results:  Lab Results  Component Value Date   WBC 7.0 08/27/2022   HGB 16.3 08/27/2022   HCT 48.0 08/27/2022   MCV 94 08/27/2022   PLT 200 08/27/2022   NEUTROABS 4.1 08/27/2022    CMP  Lab Results  Component Value Date   NA 141 08/27/2022   K 5.1 08/27/2022   CL 106 08/27/2022   CO2 22 08/27/2022   GLUCOSE 95 08/27/2022   BUN 15 08/27/2022   CREATININE 0.94 08/27/2022   CALCIUM 9.9 08/27/2022   PROT 6.6 08/27/2022   ALBUMIN 4.4 08/27/2022   AST 26 08/27/2022   ALT 24 08/27/2022   ALKPHOS 130 (H) 08/27/2022   BILITOT 0.7 08/27/2022   GFRNONAA 80 01/26/2020   GFRAA 93 01/26/2020    Medications: I have reviewed the patient's current medications.   Assessment/Plan: History of intermittent rectal bleeding.  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   CT of the abdomen and pelvis 08/04/2010 with  abdominal/pelvic lymphadenopathy. PET scan 09/13/2010 confirming mildly enlarged abdominal/pelvic lymph nodes with borderline increased FDG activity. 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 Cycle 1 bendamustine/rituximab 05/01/2011 Cycle 2 bendamustine/rituximab 05/29/2011 Cycle 3 bendamustine/rituximab 06/26/2011 Restaging PET scan 07/27/2011-no evidence of lymphoma Restaging sigmoidoscopy 10/03/2011-normal colonic mucosa Colonoscopy 05/22/2017- multiple polyps removed, tubular adenomas Colonoscopy 03/16/2021-polyps removed from the ascending and transverse colon-tubular adenomas History of iron deficiency. Ferritin was in the low normal range on 11/14/2010. Hemoglobin remains normal. ? left upper lung nodule on a chest x-ray 08/16/2010-no hypermetabolic lung nodule was seen on a PET scan 09/13/2010 12.  [facility] spotted fever August 2021         Disposition: [name] remains in clinical remission from lymphoma.  A CBC was unremarkable in August.  He would like to continue follow-up at the Cancer center.  He will return for an office visit in 1 year.  He will remain up-to-date on influenza and pneumonia vaccines.  Brad Papas, MD  11/23/2022  11:53 AM

## 2022-12-11 ENCOUNTER — Other Ambulatory Visit (HOSPITAL_COMMUNITY): Payer: Self-pay

## 2023-02-27 ENCOUNTER — Encounter: Payer: Self-pay | Admitting: Family Medicine

## 2023-02-27 ENCOUNTER — Ambulatory Visit: Payer: Medicare Other | Admitting: Family Medicine

## 2023-02-27 VITALS — BP 129/77 | Temp 97.0°F | Ht 68.0 in | Wt 174.0 lb

## 2023-02-27 DIAGNOSIS — R030 Elevated blood-pressure reading, without diagnosis of hypertension: Secondary | ICD-10-CM | POA: Diagnosis not present

## 2023-02-27 DIAGNOSIS — N5201 Erectile dysfunction due to arterial insufficiency: Secondary | ICD-10-CM

## 2023-02-27 DIAGNOSIS — E034 Atrophy of thyroid (acquired): Secondary | ICD-10-CM

## 2023-02-27 DIAGNOSIS — R7303 Prediabetes: Secondary | ICD-10-CM

## 2023-02-27 DIAGNOSIS — C8590 Non-Hodgkin lymphoma, unspecified, unspecified site: Secondary | ICD-10-CM

## 2023-02-27 DIAGNOSIS — E782 Mixed hyperlipidemia: Secondary | ICD-10-CM

## 2023-02-27 LAB — BAYER DCA HB A1C WAIVED: HB A1C (BAYER DCA - WAIVED): 6.2 % — ABNORMAL HIGH (ref 4.8–5.6)

## 2023-02-27 MED ORDER — NYSTATIN 100000 UNIT/ML MT SUSP: 5.0000 mL | Freq: Four times a day (QID) | OROMUCOSAL | 2 refills | Status: AC

## 2023-02-27 NOTE — Patient Instructions (Signed)

## 2023-02-27 NOTE — Progress Notes (Signed)
 Subjective:  Patient ID: Brad May,  male    DOB: 1940-07-06  Age: 83 y.o.    CC: Medical Management of Chronic Issues (No concerns at this time. ) and Medication Refill (Nystatin mouth wash/ not listed)   HPI Brad May presents for  follow-up of hypertension. Patient has no history of headache chest pain or shortness of breath or recent cough. Patient also denies symptoms of TIA such as numbness weakness lateralizing. Patient denies side effects from medication. States taking it regularly.  Patient also  in for follow-up of elevated cholesterol. Doing well without complaints on current medication. Denies side effects  including myalgia and arthralgia and nausea. Also in today for liver function testing. Currently no chest pain, shortness of breath or other cardiovascular related symptoms noted.  Follow-up of prediabetes Patient denies symptoms such as excessive hunger or urinary frequency, excessive hunger, nausea No significant hypoglycemic spells noted. Medications reviewed. Pt reports taking them regularly. Pt. denies complication/adverse reaction today.    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   He has a past surgical history that includes Appendectomy; Inguinal hernia repair (2006); Colonoscopy w/ polypectomy; Colonoscopy; and Polypectomy.   His family history includes COPD in his brother; Dementia in his paternal grandfather; Heart attack (age of onset: 48) in his father; Heart disease in his father and mother; Heart failure in his mother; Suicidality in his daughter.He reports that he has never smoked. He has never used smokeless tobacco. He reports current alcohol use. He  reports that he does not use drugs.  Current Outpatient Medications on File Prior to Visit  Medication Sig Dispense Refill   aspirin 81 MG tablet Take 81 mg by mouth daily.     atorvastatin (LIPITOR) 40 MG tablet Take 40 mg by mouth daily.     b complex vitamins tablet Take 1 tablet by mouth daily.     Cholecalciferol 25 MCG (1000 UT) tablet Take 2,000 Units by mouth daily.      doxycycline (PERIOSTAT) 20 MG tablet Take 20 mg by mouth 2 (two) times daily.     levothyroxine (SYNTHROID) 88 MCG tablet Take 1 tablet (88 mcg total) by mouth daily. 90 tablet 3   Probiotic Product (PROBIOTIC-10 PO) Take 1 capsule by mouth daily.      sildenafil (REVATIO) 20 MG tablet TAKE 2-5 TABLETS BY MOUTH AS NEEDED FOR SEXUAL ACTIVITY 50 tablet 5   Current Facility-Administered Medications on File Prior to Visit  Medication Dose Route Frequency Provider Last Rate Last Admin   methylPREDNISolone acetate (DEPO-MEDROL) injection 80 mg  80 mg Intramuscular Once Gabriel Earing, FNP        ROS Review of Systems  Constitutional:  Negative for fever.  Respiratory:  Negative for shortness of breath.   Cardiovascular:  Negative for chest pain.  Musculoskeletal:  Negative for arthralgias.  Skin:  Negative for rash.    Objective:  BP 129/77   Temp (!) 97 F (36.1 C)   Ht 5\' 8"  (1.727 m)   Wt 174 lb (78.9 kg)   SpO2 97%   BMI 26.46 kg/m   BP Readings from Last 3 Encounters:  02/27/23 129/77  11/23/22 131/86  08/27/22 (!) 146/86    Wt Readings from Last 3 Encounters:  02/27/23 174 lb (78.9 kg)  11/23/22 172 lb (78 kg)  08/27/22 169 lb 9.6 oz (76.9 kg)     Physical Exam Vitals reviewed.  Constitutional:      Appearance: He is well-developed.  HENT:     Head: Normocephalic and atraumatic.     Right Ear: External ear normal.     Left Ear: External ear normal.     Mouth/Throat:     Pharynx: No oropharyngeal exudate or posterior oropharyngeal erythema.  Eyes:     Pupils: Pupils are equal,  round, and reactive to light.  Cardiovascular:     Rate and Rhythm: Normal rate and regular rhythm.     Heart sounds: No murmur heard. Pulmonary:     Effort: No respiratory distress.     Breath sounds: Normal breath sounds.  Musculoskeletal:     Cervical back: Normal range of motion and neck supple.  Neurological:     Mental Status: He is alert and oriented to person, place, and time.     Lab Results  Component Value Date   HGBA1C 6.1 (H) 08/27/2022   HGBA1C 6.4 (H) 02/20/2022   HGBA1C 5.9 07/25/2018    Assessment & Plan:   [name] was seen today for medical management of chronic issues and medication refill.  Diagnoses and all orders for this visit:  Hypothyroidism due to acquired atrophy of thyroid -     TSH + free T4  Mixed hyperlipidemia -     Lipid panel  Indolent lymphoma (HCC) -     CBC with Differential/Platelet -     CMP14+EGFR  Elevated blood-pressure reading, without diagnosis of hypertension -     CBC with Differential/Platelet  Prediabetes -     Bayer DCA Hb A1c Waived  Erectile dysfunction due to arterial insufficiency   I am having Jomarie Longs A. May "[name]" maintain his aspirin, b complex vitamins, Cholecalciferol, Probiotic Product (PROBIOTIC-10 PO), levothyroxine, sildenafil, doxycycline, and atorvastatin. We will continue to administer methylPREDNISolone acetate.    Follow-up: Return in about 3 months (around 05/27/2023) for diabetes.  [name], M.D.

## 2023-02-28 LAB — CBC WITH DIFFERENTIAL/PLATELET
Basophils Absolute: 0.1 10*3/uL (ref 0.0–0.2)
Basos: 1 %
EOS (ABSOLUTE): 0.4 10*3/uL (ref 0.0–0.4)
Eos: 6 %
Hematocrit: 45.9 % (ref 37.5–51.0)
Hemoglobin: 16 g/dL (ref 13.0–17.7)
Immature Grans (Abs): 0 10*3/uL (ref 0.0–0.1)
Immature Granulocytes: 0 %
Lymphocytes Absolute: 2.2 10*3/uL (ref 0.7–3.1)
Lymphs: 32 %
MCH: 32.5 pg (ref 26.6–33.0)
MCHC: 34.9 g/dL (ref 31.5–35.7)
MCV: 93 fL (ref 79–97)
Monocytes Absolute: 0.6 10*3/uL (ref 0.1–0.9)
Monocytes: 9 %
Neutrophils Absolute: 3.6 10*3/uL (ref 1.4–7.0)
Neutrophils: 52 %
Platelets: 207 10*3/uL (ref 150–450)
RBC: 4.92 x10E6/uL (ref 4.14–5.80)
RDW: 12.5 % (ref 11.6–15.4)
WBC: 6.8 10*3/uL (ref 3.4–10.8)

## 2023-02-28 LAB — TSH+FREE T4
Free T4: 1.43 ng/dL (ref 0.82–1.77)
TSH: 1.46 u[IU]/mL (ref 0.450–4.500)

## 2023-02-28 LAB — CMP14+EGFR
ALT: 32 [IU]/L (ref 0–44)
AST: 27 [IU]/L (ref 0–40)
Albumin: 4.5 g/dL (ref 3.7–4.7)
Alkaline Phosphatase: 137 [IU]/L — ABNORMAL HIGH (ref 44–121)
BUN/Creatinine Ratio: 15 (ref 10–24)
BUN: 14 mg/dL (ref 8–27)
Bilirubin Total: 0.8 mg/dL (ref 0.0–1.2)
CO2: 22 mmol/L (ref 20–29)
Calcium: 9.3 mg/dL (ref 8.6–10.2)
Chloride: 105 mmol/L (ref 96–106)
Creatinine, Ser: 0.94 mg/dL (ref 0.76–1.27)
Globulin, Total: 1.8 g/dL (ref 1.5–4.5)
Glucose: 121 mg/dL — ABNORMAL HIGH (ref 70–99)
Potassium: 4.7 mmol/L (ref 3.5–5.2)
Sodium: 141 mmol/L (ref 134–144)
Total Protein: 6.3 g/dL (ref 6.0–8.5)
eGFR: 80 mL/min/{1.73_m2} (ref >59)

## 2023-02-28 LAB — LIPID PANEL
Chol/HDL Ratio: 3.8 {ratio} (ref 0.0–5.0)
Cholesterol, Total: 143 mg/dL (ref 100–199)
HDL: 38 mg/dL — ABNORMAL LOW (ref >39)
LDL Chol Calc (NIH): 86 mg/dL (ref 0–99)
Triglycerides: 103 mg/dL (ref 0–149)
VLDL Cholesterol Cal: 19 mg/dL (ref 5–40)

## 2023-03-01 ENCOUNTER — Encounter: Payer: Self-pay | Admitting: Family Medicine

## 2023-03-01 NOTE — Progress Notes (Signed)
Hello Brad May,  Your lab result is normal and/or stable.Some minor variations that are not significant are commonly marked abnormal, but do not represent any medical problem for you.  Best regards, [name], M.D.

## 2023-04-10 DIAGNOSIS — B009 Herpesviral infection, unspecified: Secondary | ICD-10-CM | POA: Diagnosis not present

## 2023-04-10 DIAGNOSIS — K1379 Other lesions of oral mucosa: Secondary | ICD-10-CM | POA: Diagnosis not present

## 2023-04-10 DIAGNOSIS — L719 Rosacea, unspecified: Secondary | ICD-10-CM | POA: Diagnosis not present

## 2023-04-10 DIAGNOSIS — Z85828 Personal history of other malignant neoplasm of skin: Secondary | ICD-10-CM | POA: Diagnosis not present

## 2023-04-10 DIAGNOSIS — L905 Scar conditions and fibrosis of skin: Secondary | ICD-10-CM | POA: Diagnosis not present

## 2023-04-10 DIAGNOSIS — L578 Other skin changes due to chronic exposure to nonionizing radiation: Secondary | ICD-10-CM | POA: Diagnosis not present

## 2023-04-10 DIAGNOSIS — L821 Other seborrheic keratosis: Secondary | ICD-10-CM | POA: Diagnosis not present

## 2023-04-19 DIAGNOSIS — H2513 Age-related nuclear cataract, bilateral: Secondary | ICD-10-CM | POA: Diagnosis not present

## 2023-04-19 DIAGNOSIS — H2511 Age-related nuclear cataract, right eye: Secondary | ICD-10-CM | POA: Diagnosis not present

## 2023-05-22 ENCOUNTER — Encounter (HOSPITAL_COMMUNITY): Payer: Self-pay

## 2023-05-29 ENCOUNTER — Other Ambulatory Visit (HOSPITAL_COMMUNITY): Payer: Self-pay

## 2023-05-29 ENCOUNTER — Telehealth: Payer: Self-pay

## 2023-05-29 ENCOUNTER — Ambulatory Visit: Payer: Self-pay | Admitting: Family Medicine

## 2023-05-29 ENCOUNTER — Encounter: Payer: Self-pay | Admitting: Family Medicine

## 2023-05-29 ENCOUNTER — Ambulatory Visit (INDEPENDENT_AMBULATORY_CARE_PROVIDER_SITE_OTHER): Payer: 59 | Admitting: Family Medicine

## 2023-05-29 VITALS — BP 133/77 | HR 69 | Temp 97.5°F | Ht 68.0 in | Wt 171.2 lb

## 2023-05-29 DIAGNOSIS — E034 Atrophy of thyroid (acquired): Secondary | ICD-10-CM | POA: Diagnosis not present

## 2023-05-29 DIAGNOSIS — R030 Elevated blood-pressure reading, without diagnosis of hypertension: Secondary | ICD-10-CM

## 2023-05-29 DIAGNOSIS — N5201 Erectile dysfunction due to arterial insufficiency: Secondary | ICD-10-CM | POA: Diagnosis not present

## 2023-05-29 DIAGNOSIS — C8590 Non-Hodgkin lymphoma, unspecified, unspecified site: Secondary | ICD-10-CM

## 2023-05-29 DIAGNOSIS — E782 Mixed hyperlipidemia: Secondary | ICD-10-CM | POA: Diagnosis not present

## 2023-05-29 DIAGNOSIS — R7303 Prediabetes: Secondary | ICD-10-CM

## 2023-05-29 LAB — BAYER DCA HB A1C WAIVED: HB A1C (BAYER DCA - WAIVED): 5.9 % — ABNORMAL HIGH (ref 4.8–5.6)

## 2023-05-29 MED ORDER — SILDENAFIL CITRATE 20 MG PO TABS: ORAL_TABLET | ORAL | 5 refills | Status: AC

## 2023-05-29 NOTE — Progress Notes (Signed)
 Subjective:  Patient ID: Brad May, male    DOB: 03-17-1940  Age: 83 y.o. MRN: [id number]  CC: Diabetes (3 month follow up )   HPI Fletcher Humble Hursey presents for reheck of prediabetes. Working on diet, exercise. No meds. Denies polydipsia, nausea, some urinary frequency.   follow-up on  thyroid . The patient has a history of hypothyroidism for many years. It has been stable recently. Pt. denies any change in  voice, loss of hair, heat or cold intolerance. Energy level has been adequate to good. Patient denies constipation and diarrhea. No myxedema. Medication is as noted below. Verified that pt is taking it daily on an empty stomach. Well tolerated.  Followed by oncolology fpr lymphoma. No treatment currently.      05/29/2023   10:07 AM 02/27/2023    8:23 AM 08/27/2022    1:12 PM  Depression screen PHQ 2/9  Decreased Interest 0 0 0  Down, Depressed, Hopeless 0 0 0  PHQ - 2 Score 0 0 0  Altered sleeping 0 0 0  Tired, decreased energy 0 0 0  Change in appetite 0 0 0  Feeling bad or failure about yourself  0 0 0  Trouble concentrating 0 0 0  Moving slowly or fidgety/restless 0 0 0  Suicidal thoughts 0 0 0  PHQ-9 Score 0 0 0  Difficult doing work/chores Not difficult at all Not difficult at all Not difficult at all    History Susana has a past medical history of BPH (benign prostatic hyperplasia), Carpal tunnel syndrome (10/01/2017), Colitis (09/25/2010), Duodenitis, Hyperlipidemia, 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   He has a past surgical history that includes Appendectomy; Inguinal hernia repair (2006); Colonoscopy w/ polypectomy; Colonoscopy; and Polypectomy.   His family history includes COPD in his brother; Dementia in his paternal grandfather; Heart attack (age of onset: 3) in his father; Heart disease in his father and mother; Heart failure  in his mother; Suicidality in his daughter.He reports that he has never smoked. He has never used smokeless tobacco. He reports current alcohol use. He reports that he does not use drugs.    ROS Review of Systems  Constitutional:  Negative for fever.  Respiratory:  Negative for shortness of breath.   Cardiovascular:  Negative for chest pain.  Endocrine: Negative for polydipsia, polyphagia and polyuria.  Genitourinary:  Positive for frequency.  Musculoskeletal:  Negative for arthralgias.  Skin:  Negative for rash.    Objective:  BP 133/77   Pulse 69   Temp (!) 97.5 F (36.4 C)   Ht 5\' 8"  (1.727 m)   Wt 171 lb 3.2 oz (77.7 kg)   SpO2 96%   BMI 26.03 kg/m   BP Readings from Last 3 Encounters:  05/29/23 133/77  02/27/23 129/77  11/23/22 131/86    Wt Readings from Last 3 Encounters:  05/29/23 171 lb 3.2 oz (77.7 kg)  02/27/23 174 lb (78.9 kg)  11/23/22 172 lb (78 kg)     Physical Exam Vitals reviewed.  Constitutional:      Appearance: He is well-developed.  HENT:     Head: Normocephalic and atraumatic.     Right Ear: External ear normal.     Left Ear: External ear normal.     Mouth/Throat:     Pharynx: No oropharyngeal exudate or posterior oropharyngeal erythema.  Eyes:     Pupils: Pupils are equal, round, and reactive  to light.  Cardiovascular:     Rate and Rhythm: Normal rate and regular rhythm.     Heart sounds: No murmur heard. Pulmonary:     Effort: No respiratory distress.     Breath sounds: Normal breath sounds.  Musculoskeletal:     Cervical back: Normal range of motion and neck supple.  Neurological:     Mental Status: He is alert and oriented to person, place, and time.      Assessment & Plan:  Prediabetes -     Bayer DCA Hb A1c Waived  Hypothyroidism due to acquired atrophy of thyroid  -     TSH + free T4  Mixed hyperlipidemia -     Lipid panel  Indolent lymphoma (HCC) -     CMP14+EGFR  Erectile dysfunction due to arterial  insufficiency -     Sildenafil  Citrate; TAKE 2-5 TABLETS BY MOUTH AS NEEDED FOR SEXUAL ACTIVITY  Dispense: 50 tablet; Refill: 5     Follow-up: Return in about 6 months (around 11/29/2023) for Hypothyroidism, prediabetes.  Roise Cleaver, M.D.

## 2023-05-29 NOTE — Telephone Encounter (Signed)
 Pharmacy Patient Advocate Encounter   Received notification from CoverMyMeds that prior authorization for  Sildenafil  Citrate (PAH) 20MG  tablets is required/requested.   Insurance verification completed.   The patient is insured through [facility] .   Per test claim: PA required; PA submitted to above mentioned insurance via CoverMyMeds Key/confirmation #/EOC [facility] Status is pending

## 2023-05-30 LAB — CMP14+EGFR
ALT: 29 IU/L (ref 0–44)
AST: 25 IU/L (ref 0–40)
Albumin: 4.3 g/dL (ref 3.7–4.7)
Alkaline Phosphatase: 132 IU/L — ABNORMAL HIGH (ref 44–121)
BUN/Creatinine Ratio: 20 (ref 10–24)
BUN: 17 mg/dL (ref 8–27)
Bilirubin Total: 0.7 mg/dL (ref 0.0–1.2)
CO2: 20 mmol/L (ref 20–29)
Calcium: 9.6 mg/dL (ref 8.6–10.2)
Chloride: 105 mmol/L (ref 96–106)
Creatinine, Ser: 0.86 mg/dL (ref 0.76–1.27)
Globulin, Total: 1.9 g/dL (ref 1.5–4.5)
Glucose: 107 mg/dL — ABNORMAL HIGH (ref 70–99)
Potassium: 5.4 mmol/L — ABNORMAL HIGH (ref 3.5–5.2)
Sodium: 141 mmol/L (ref 134–144)
Total Protein: 6.2 g/dL (ref 6.0–8.5)
eGFR: 86 mL/min/{1.73_m2} (ref >59)

## 2023-05-30 LAB — LIPID PANEL
Chol/HDL Ratio: 4.3 ratio (ref 0.0–5.0)
Cholesterol, Total: 155 mg/dL (ref 100–199)
HDL: 36 mg/dL — ABNORMAL LOW (ref >39)
LDL Chol Calc (NIH): 101 mg/dL — ABNORMAL HIGH (ref 0–99)
Triglycerides: 98 mg/dL (ref 0–149)
VLDL Cholesterol Cal: 18 mg/dL (ref 5–40)

## 2023-05-30 LAB — TSH+FREE T4
Free T4: 1.37 ng/dL (ref 0.82–1.77)
TSH: 1.69 u[IU]/mL (ref 0.450–4.500)

## 2023-05-30 NOTE — Telephone Encounter (Signed)
 Pharmacy Patient Advocate Encounter  Received notification from [facility] that Prior Authorization for SILDENAFIL  20MG  TABS has been DENIED.  Full denial letter will be uploaded to the media tab. See denial reason below.   PA #/Case ID/Reference #: [id number]

## 2023-05-30 NOTE — Telephone Encounter (Signed)
 Pt notified via Mychart. LS

## 2023-06-03 ENCOUNTER — Other Ambulatory Visit: Payer: Self-pay

## 2023-06-03 DIAGNOSIS — E875 Hyperkalemia: Secondary | ICD-10-CM

## 2023-06-05 ENCOUNTER — Other Ambulatory Visit

## 2023-06-05 DIAGNOSIS — E875 Hyperkalemia: Secondary | ICD-10-CM | POA: Diagnosis not present

## 2023-06-06 LAB — CMP14+EGFR
ALT: 21 IU/L (ref 0–44)
AST: 18 IU/L (ref 0–40)
Albumin: 4.3 g/dL (ref 3.7–4.7)
Alkaline Phosphatase: 133 IU/L — ABNORMAL HIGH (ref 44–121)
BUN/Creatinine Ratio: 13 (ref 10–24)
BUN: 13 mg/dL (ref 8–27)
Bilirubin Total: 0.7 mg/dL (ref 0.0–1.2)
CO2: 21 mmol/L (ref 20–29)
Calcium: 9.8 mg/dL (ref 8.6–10.2)
Chloride: 106 mmol/L (ref 96–106)
Creatinine, Ser: 1.01 mg/dL (ref 0.76–1.27)
Globulin, Total: 2 g/dL (ref 1.5–4.5)
Glucose: 108 mg/dL — ABNORMAL HIGH (ref 70–99)
Potassium: 5.7 mmol/L — ABNORMAL HIGH (ref 3.5–5.2)
Sodium: 142 mmol/L (ref 134–144)
Total Protein: 6.3 g/dL (ref 6.0–8.5)
eGFR: 74 mL/min/{1.73_m2} (ref >59)

## 2023-06-09 ENCOUNTER — Ambulatory Visit: Payer: Self-pay | Admitting: Family Medicine

## 2023-06-09 NOTE — Progress Notes (Signed)
 Hello Brad May,  Your lab result is normal and/or stable.Some minor variations that are not significant are commonly marked abnormal, but do not represent any medical problem for you.  Best regards, [name], M.D.

## 2023-06-24 DIAGNOSIS — Z961 Presence of intraocular lens: Secondary | ICD-10-CM | POA: Diagnosis not present

## 2023-06-24 DIAGNOSIS — H2512 Age-related nuclear cataract, left eye: Secondary | ICD-10-CM | POA: Diagnosis not present

## 2023-06-24 DIAGNOSIS — Z9842 Cataract extraction status, left eye: Secondary | ICD-10-CM | POA: Diagnosis not present

## 2023-07-03 DIAGNOSIS — Z9841 Cataract extraction status, right eye: Secondary | ICD-10-CM | POA: Diagnosis not present

## 2023-07-03 DIAGNOSIS — H2511 Age-related nuclear cataract, right eye: Secondary | ICD-10-CM | POA: Diagnosis not present

## 2023-07-03 DIAGNOSIS — Z961 Presence of intraocular lens: Secondary | ICD-10-CM | POA: Diagnosis not present

## 2023-10-08 ENCOUNTER — Other Ambulatory Visit: Payer: Self-pay | Admitting: Family Medicine

## 2023-10-08 DIAGNOSIS — E034 Atrophy of thyroid (acquired): Secondary | ICD-10-CM

## 2023-11-05 ENCOUNTER — Encounter

## 2023-11-14 ENCOUNTER — Telehealth: Payer: Self-pay | Admitting: Oncology

## 2023-11-14 NOTE — Telephone Encounter (Signed)
 Spoke with PT about reschedule appt. Day and time confirmed.

## 2023-11-26 ENCOUNTER — Ambulatory Visit: Payer: BC Managed Care – PPO | Admitting: Oncology

## 2023-12-04 ENCOUNTER — Encounter: Payer: Self-pay | Admitting: Family Medicine

## 2023-12-04 ENCOUNTER — Ambulatory Visit: Payer: Self-pay | Admitting: Family Medicine

## 2023-12-04 VITALS — BP 123/73 | HR 88 | Temp 97.9°F | Ht 68.0 in | Wt 172.0 lb

## 2023-12-04 DIAGNOSIS — E559 Vitamin D deficiency, unspecified: Secondary | ICD-10-CM | POA: Diagnosis not present

## 2023-12-04 DIAGNOSIS — E782 Mixed hyperlipidemia: Secondary | ICD-10-CM | POA: Diagnosis not present

## 2023-12-04 DIAGNOSIS — N400 Enlarged prostate without lower urinary tract symptoms: Secondary | ICD-10-CM

## 2023-12-04 DIAGNOSIS — R7303 Prediabetes: Secondary | ICD-10-CM | POA: Diagnosis not present

## 2023-12-04 DIAGNOSIS — E034 Atrophy of thyroid (acquired): Secondary | ICD-10-CM | POA: Diagnosis not present

## 2023-12-04 LAB — BAYER DCA HB A1C WAIVED: HB A1C (BAYER DCA - WAIVED): 6.2 % — ABNORMAL HIGH (ref 4.8–5.6)

## 2023-12-04 NOTE — Progress Notes (Signed)
 Subjective:  Patient ID: Brad May, male    DOB: [birth year]  Age: 83 y.o. MRN: [id number]  CC: Medical Management of Chronic Issues (Getting over a cold doing better.)   HPI  Discussed the use of AI scribe software for clinical note transcription with the patient, who gave verbal consent to proceed.  History of Present Illness Brad May is an 83 year old male who presents for a follow-up on thyroid  and cholesterol management.  He is currently taking atorvastatin  for cholesterol and levothyroxine  for thyroid  management. No symptoms such as diarrhea, constipation, excessive hair loss, hot flashes, or feeling cold. His energy level is fine.  He has a history of prediabetes with a recent A1c of 6.2, which has been as high as 6.2 in the past and was 5.9 previously. He is not currently on medication for this and prefers to manage it through diet and exercise. During the summer, he was more active, walking three miles a day, four days a week, and doing yard work, which he believes helped manage his blood sugar levels.  He has a history of [facility] spotted fever and occasionally experiences skin breakouts, which he treats with doxycycline  as needed. He is not using it regularly and notes that the breakouts resolve on his own over time.  He is currently taking vitamin D  and sildenafil , which he reports are working well for him. No issues with heartburn or indigestion.          05/29/2023   10:07 AM 02/27/2023    8:23 AM 08/27/2022    1:12 PM  Depression screen PHQ 2/9  Decreased Interest 0 0 0  Down, Depressed, Hopeless 0 0 0  PHQ - 2 Score 0 0 0  Altered sleeping 0 0 0  Tired, decreased energy 0 0 0  Change in appetite 0 0 0  Feeling bad or failure about yourself  0 0 0  Trouble concentrating 0 0 0  Moving slowly or fidgety/restless 0 0 0  Suicidal thoughts 0 0 0  PHQ-9 Score 0  0  0   Difficult doing work/chores Not difficult at all Not difficult at all  Not difficult at all     Data saved with a previous flowsheet row definition    History Missael has a past medical history of BPH (benign prostatic hyperplasia), Carpal tunnel syndrome (10/01/2017), Colitis (09/25/2010), Duodenitis, Hyperlipidemia, 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   He has a past surgical history that includes Appendectomy; Inguinal hernia repair (2006); Colonoscopy w/ polypectomy; Colonoscopy; and Polypectomy.   His family history includes COPD in his brother; Dementia in his paternal grandfather; Heart attack (age of onset: 30) in his father; Heart disease in his father and mother; Heart failure in his mother; Suicidality in his daughter.He reports that he has never smoked. He has never used smokeless tobacco. He reports current alcohol use. He reports that he does not use drugs.    ROS Review of Systems  Constitutional: Negative.   HENT: Negative.    Eyes:  Negative for visual disturbance.  Respiratory:  Negative for cough and shortness of breath.   Cardiovascular:  Negative for chest pain and leg swelling.  Gastrointestinal:  Negative for abdominal pain, diarrhea, nausea and vomiting.  Genitourinary:  Negative for difficulty urinating.  Musculoskeletal:  Negative for arthralgias and myalgias.  Skin:  Negative for rash.  Neurological:  Negative for  headaches.  Psychiatric/Behavioral:  Negative for sleep disturbance.     Objective:  BP 123/73   Pulse 88   Temp 97.9 F (36.6 C)   Ht 5' 8 (1.727 m)   Wt 172 lb (78 kg)   SpO2 96%   BMI 26.15 kg/m   BP Readings from Last 3 Encounters:  12/04/23 123/73  05/29/23 133/77  02/27/23 129/77    Wt Readings from Last 3 Encounters:  12/04/23 172 lb (78 kg)  05/29/23 171 lb 3.2 oz (77.7 kg)  02/27/23 174 lb (78.9 kg)     Physical Exam Vitals reviewed.  Constitutional:       Appearance: He is well-developed.  HENT:     Head: Normocephalic and atraumatic.     Right Ear: External ear normal.     Left Ear: External ear normal.     Mouth/Throat:     Pharynx: No oropharyngeal exudate or posterior oropharyngeal erythema.  Eyes:     Pupils: Pupils are equal, round, and reactive to light.  Cardiovascular:     Rate and Rhythm: Normal rate and regular rhythm.     Heart sounds: No murmur heard. Pulmonary:     Effort: No respiratory distress.     Breath sounds: Normal breath sounds.  Musculoskeletal:     Cervical back: Normal range of motion and neck supple.  Neurological:     Mental Status: He is alert and oriented to person, place, and time.    Physical Exam MEASUREMENTS: Weight- 168-170. GENERAL: Alert, cooperative, well developed, no acute distress HEENT: Normocephalic, normal oropharynx, moist mucous membranes CHEST: Clear to auscultation bilaterally, No wheezes, rhonchi, or crackles CARDIOVASCULAR: Normal heart rate and rhythm, S1 and S2 normal without murmurs ABDOMEN: Soft, non-tender, non-distended, without organomegaly, Normal bowel sounds EXTREMITIES: No cyanosis or edema NEUROLOGICAL: Cranial nerves grossly intact, Moves all extremities without gross motor or sensory deficit   Assessment & Plan:  Hypothyroidism due to acquired atrophy of thyroid  -     TSH + free T4  Mixed hyperlipidemia -     Comprehensive metabolic panel with GFR -     Lipid panel -     CBC with Differential/Platelet  Prediabetes -     Bayer DCA Hb A1c Waived -     CBC with Differential/Platelet  Vitamin D  deficiency -     VITAMIN D  25 Hydroxy (Vit-D Deficiency, Fractures)  Benign prostatic hyperplasia, unspecified whether lower urinary tract symptoms present -     PSA, total and free    Assessment and Plan Assessment & Plan Thyroid  atrophy (acquired)   Thyroid  function is well-managed with no symptoms of hyperthyroidism or hypothyroidism. Energy levels are high,  with no diarrhea, constipation, hair loss, hot flashes, or cold intolerance. Continue current dose of levothyroxine . Await thyroid  function test results to determine if medication adjustment is needed.  Mixed hyperlipidemia   Cholesterol levels are managed with atorvastatin , with no adverse effects. Continue atorvastatin  as prescribed for cardiovascular health.  Prediabetes   A1c is 6.2, indicating prediabetes. Prefers lifestyle modifications over medication. Regular exercise and weight management are crucial to prevent progression to diabetes. Encourage reduction of sugar intake and promote regular exercise, aiming for at least three miles a day, four days a week. Monitor A1c levels at next visit.  Vitamin D  deficiency   Continues vitamin D  supplementation with no issues. Continue vitamin D  supplementation.       Follow-up: Return in about 6 months (around 06/02/2024) for [facility] physical.  Butler Der, M.D.

## 2023-12-05 LAB — CBC WITH DIFFERENTIAL/PLATELET
Basophils Absolute: 0.1 x10E3/uL (ref 0.0–0.2)
Basos: 1 %
EOS (ABSOLUTE): 0.3 x10E3/uL (ref 0.0–0.4)
Eos: 4 %
Hematocrit: 48.6 % (ref 37.5–51.0)
Hemoglobin: 16.3 g/dL (ref 13.0–17.7)
Immature Grans (Abs): 0 x10E3/uL (ref 0.0–0.1)
Immature Granulocytes: 0 %
Lymphocytes Absolute: 1.8 x10E3/uL (ref 0.7–3.1)
Lymphs: 22 %
MCH: 31.5 pg (ref 26.6–33.0)
MCHC: 33.5 g/dL (ref 31.5–35.7)
MCV: 94 fL (ref 79–97)
Monocytes Absolute: 0.7 x10E3/uL (ref 0.1–0.9)
Monocytes: 9 %
Neutrophils Absolute: 5.3 x10E3/uL (ref 1.4–7.0)
Neutrophils: 64 %
Platelets: 203 x10E3/uL (ref 150–450)
RBC: 5.18 x10E6/uL (ref 4.14–5.80)
RDW: 12.6 % (ref 11.6–15.4)
WBC: 8.2 x10E3/uL (ref 3.4–10.8)

## 2023-12-05 LAB — COMPREHENSIVE METABOLIC PANEL WITH GFR
ALT: 25 IU/L (ref 0–44)
AST: 27 IU/L (ref 0–40)
Albumin: 4.3 g/dL (ref 3.7–4.7)
Alkaline Phosphatase: 128 IU/L (ref 48–129)
BUN/Creatinine Ratio: 15 (ref 10–24)
BUN: 15 mg/dL (ref 8–27)
Bilirubin Total: 0.7 mg/dL (ref 0.0–1.2)
CO2: 22 mmol/L (ref 20–29)
Calcium: 9.7 mg/dL (ref 8.6–10.2)
Chloride: 101 mmol/L (ref 96–106)
Creatinine, Ser: 1.03 mg/dL (ref 0.76–1.27)
Globulin, Total: 2.3 g/dL (ref 1.5–4.5)
Glucose: 112 mg/dL — ABNORMAL HIGH (ref 70–99)
Potassium: 4.9 mmol/L (ref 3.5–5.2)
Sodium: 138 mmol/L (ref 134–144)
Total Protein: 6.6 g/dL (ref 6.0–8.5)
eGFR: 72 mL/min/1.73 (ref >59)

## 2023-12-05 LAB — VITAMIN D 25 HYDROXY (VIT D DEFICIENCY, FRACTURES): Vit D, 25-Hydroxy: 40.9 ng/mL (ref 30.0–100.0)

## 2023-12-05 LAB — LIPID PANEL
Chol/HDL Ratio: 4.3 ratio (ref 0.0–5.0)
Cholesterol, Total: 155 mg/dL (ref 100–199)
HDL: 36 mg/dL — ABNORMAL LOW (ref >39)
LDL Chol Calc (NIH): 95 mg/dL (ref 0–99)
Triglycerides: 134 mg/dL (ref 0–149)
VLDL Cholesterol Cal: 24 mg/dL (ref 5–40)

## 2023-12-05 LAB — PSA, TOTAL AND FREE
PSA, Free Pct: 23.3 %
PSA, Free: 0.63 ng/mL
Prostate Specific Ag, Serum: 2.7 ng/mL (ref 0.0–4.0)

## 2023-12-05 LAB — TSH+FREE T4
Free T4: 1.55 ng/dL (ref 0.82–1.77)
TSH: 2.02 u[IU]/mL (ref 0.450–4.500)

## 2023-12-08 ENCOUNTER — Ambulatory Visit: Payer: Self-pay | Admitting: Family Medicine

## 2023-12-08 NOTE — Progress Notes (Signed)
 Hello Donyae,  Your lab result is normal and/or stable.Some minor variations that are not significant are commonly marked abnormal, but do not represent any medical problem for you.  Best regards, [name], M.D.

## 2023-12-24 ENCOUNTER — Inpatient Hospital Stay: Payer: Self-pay | Attending: Oncology | Admitting: Oncology

## 2023-12-24 ENCOUNTER — Other Ambulatory Visit (HOSPITAL_BASED_OUTPATIENT_CLINIC_OR_DEPARTMENT_OTHER): Payer: Self-pay

## 2023-12-24 VITALS — BP 132/92 | HR 71 | Temp 97.2°F | Resp 17 | Wt 172.0 lb

## 2023-12-24 DIAGNOSIS — C8380 Other non-follicular lymphoma, unspecified site: Secondary | ICD-10-CM

## 2023-12-24 DIAGNOSIS — C8841 Extranodal marginal zone b-cell lymphoma of mucosa-associated lymphoid tissue (malt-lymphoma), in remission: Secondary | ICD-10-CM | POA: Diagnosis present

## 2023-12-24 MED ORDER — PNEUMOCOCCAL 21-VAL CONJ VACC 0.5 ML IM SOSY
0.5000 mL | PREFILLED_SYRINGE | Freq: Once | INTRAMUSCULAR | 0 refills | Status: AC
  Filled 2023-12-24: qty 0.5, 1d supply, fill #0

## 2023-12-24 NOTE — Progress Notes (Signed)
  [facility] OFFICE PROGRESS NOTE   Diagnosis: Non-Hodgkin's lymphoma  INTERVAL HISTORY:   [name] returns as scheduled.  He feels well.  No fever or night sweats.  He had an upper respiratory infection a few weeks ago with a cough and rhinorrhea.  This has improved.  No difficulty with bowel function.  No bleeding.  Good appetite. He reports being up-to-date on influenza and COVID-19 vaccines. Objective:  Vital signs in last 24 hours:  Blood pressure (!) 132/92, pulse 71, temperature (!) 97.2 F (36.2 C), temperature source Temporal, resp. rate 17, weight 172 lb (78 kg), SpO2 97%.     Lymphatics: No cervical, supraclavicular, axillary, or inguinal nodes Resp: Lungs clear bilaterally Cardio: Regular rate and rhythm GI: No hepatosplenomegaly, no mass, nontender Vascular: Leg edema   Lab Results:  Lab Results  Component Value Date   WBC 8.2 12/04/2023   HGB 16.3 12/04/2023   HCT 48.6 12/04/2023   MCV 94 12/04/2023   PLT 203 12/04/2023   NEUTROABS 5.3 12/04/2023    CMP  Lab Results  Component Value Date   NA 138 12/04/2023   K 4.9 12/04/2023   CL 101 12/04/2023   CO2 22 12/04/2023   GLUCOSE 112 (H) 12/04/2023   BUN 15 12/04/2023   CREATININE 1.03 12/04/2023   CALCIUM  9.7 12/04/2023   PROT 6.6 12/04/2023   ALBUMIN 4.3 12/04/2023   AST 27 12/04/2023   ALT 25 12/04/2023   ALKPHOS 128 12/04/2023   BILITOT 0.7 12/04/2023   GFRNONAA 80 01/26/2020   GFRAA 93 01/26/2020    No results found for: CEA1, CEA, CAN199, CA125  No results found for: INR, LABPROT  Imaging:  No results found.  Medications: I have reviewed the patient's current medications.   Assessment/Plan: History of intermittent rectal bleeding.  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   CT of the abdomen and pelvis 08/04/2010 with abdominal/pelvic lymphadenopathy. PET scan 09/13/2010 confirming mildly enlarged abdominal/pelvic lymph nodes with borderline increased FDG activity. 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 Cycle 1 bendamustine /rituximab  05/01/2011 Cycle 2 bendamustine /rituximab  05/29/2011 Cycle 3 bendamustine /rituximab  06/26/2011 Restaging PET scan 07/27/2011-no evidence of lymphoma Restaging sigmoidoscopy 10/03/2011-normal colonic mucosa Colonoscopy 05/22/2017- multiple polyps removed, tubular adenomas Colonoscopy 03/16/2021-polyps removed from the ascending and transverse colon-tubular adenomas History of iron deficiency. Ferritin was in the low normal range on 11/14/2010. Hemoglobin remains normal. ? left upper lung nodule on a chest x-ray 08/16/2010-no hypermetabolic lung nodule was seen on a PET scan 09/13/2010 12.  [facility] spotted fever August 2021        Disposition: Mr Hemann appears stable.  There is no clinical evidence for progression of the marginal zone lymphoma.  He would like to continue follow-up in the oncology clinic.  He will return for an office visit in 1 year.  He will obtain a pneumococcal 20 or 21 vaccine.  Arley Hof, MD  12/24/2023  9:02 AM

## 2024-01-14 ENCOUNTER — Ambulatory Visit: Payer: Self-pay

## 2024-01-14 VITALS — BP 143/83 | HR 80 | Temp 97.6°F | Ht 68.0 in | Wt 175.0 lb

## 2024-01-14 DIAGNOSIS — Z0000 Encounter for general adult medical examination without abnormal findings: Secondary | ICD-10-CM

## 2024-01-14 NOTE — Progress Notes (Signed)
 "  Chief Complaint  Patient presents with   Medicare Wellness     Subjective:   Brad May is a 83 y.o. male who presents for a Medicare Annual Wellness Visit.  Visit info / Clinical Intake: Medicare Wellness Visit Type:: Subsequent Annual Wellness Visit Persons participating in visit and providing information:: patient Interpreter Needed?: No Pre-visit prep was completed: yes AWV questionnaire completed by patient prior to visit?: no Living arrangements:: lives with spouse/significant other Patient's Overall Health Status Rating: very good Typical amount of pain: none Does pain affect daily life?: no Are you currently prescribed opioids?: no  Dietary Habits and Nutritional Risks Eats fruit and vegetables daily?: yes Most meals are obtained by: preparing own meals In the last 2 weeks, have you had any of the following?: none Diabetic:: no  Functional Status Activities of Daily Living (to include ambulation/medication): Independent Ambulation: Independent Medication Administration: Independent Home Management (perform basic housework or laundry): Independent Manage your own finances?: yes Primary transportation is: driving Concerns about vision?: no *vision screening is required for WTM* (last ov 6/25/[facility] in [place]) Concerns about hearing?: (!) yes Uses hearing aids?: no  Fall Screening Falls in the past year?: 0 Number of falls in past year: 0 Was there an injury with Fall?: 0 Fall Risk Category Calculator: 0 Patient Fall Risk Level: Low Fall Risk  Fall Risk Patient at Risk for Falls Due to: No Fall Risks Fall risk Follow up: Falls evaluation completed; Education provided  Home and Transportation Safety: All rugs have non-skid backing?: yes All stairs or steps have railings?: yes Grab bars in the bathtub or shower?: yes Have non-skid surface in bathtub or shower?: yes Good home lighting?: yes Regular seat belt use?:  yes Hospital stays in the last year:: no  Cognitive Assessment Difficulty concentrating, remembering, or making decisions? : yes (remembering) Will 6CIT or Mini Cog be Completed: yes What year is it?: 0 points What month is it?: 0 points Give patient an address phrase to remember (5 components): [address] [place] what time is it?: 0 points Count backwards from 20 to 1: 0 points Say the months of the year in reverse: 0 points Repeat the address phrase from earlier: 0 points 6 CIT Score: 0 points  Advance Directives (For Healthcare) Does Patient Have a Medical Advance Directive?: Yes Does patient want to make changes to medical advance directive?: No - Patient declined Type of Advance Directive: Living will; Healthcare Power of Attorney Copy of Healthcare Power of Attorney in Chart?: No - copy requested Copy of Living Will in Chart?: No - copy requested  Reviewed/Updated  Reviewed/Updated: Reviewed All (Medical, Surgical, Family, Medications, Allergies, Care Teams, Patient Goals); Medical History; Surgical History; Family History; Medications; Allergies; Care Teams; Patient Goals    Allergies (verified) Sulfonamide derivatives   Current Medications (verified) Outpatient Encounter Medications as of 01/14/2024  Medication Sig   aspirin 81 MG tablet Take 81 mg by mouth daily.   atorvastatin  (LIPITOR) 40 MG tablet Take 40 mg by mouth daily.   b complex vitamins tablet Take 1 tablet by mouth daily.   Cholecalciferol 25 MCG (1000 UT) tablet Take 2,000 Units by mouth daily.    levothyroxine  (SYNTHROID ) 88 MCG tablet TAKE 1 TABLET BY MOUTH EVERY DAY   Probiotic Product (PROBIOTIC-10 PO) Take 1 capsule by mouth daily.    sildenafil  (REVATIO ) 20 MG tablet TAKE 2-5 TABLETS BY MOUTH AS NEEDED FOR SEXUAL ACTIVITY   nystatin  (MYCOSTATIN ) 100000 UNIT/ML suspension Take  5 mLs (500,000 Units total) by mouth 4 (four) times daily. Swish in mouth and spit out (Patient not taking:  Reported on 01/14/2024)   No facility-administered encounter medications on file as of 01/14/2024.    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  Bilateral     Thyroid  disease    Tubular adenoma of colon    Past Surgical History:  Procedure Laterality Date   APPENDECTOMY     COLONOSCOPY     COLONOSCOPY W/ POLYPECTOMY     INGUINAL HERNIA REPAIR  2006   left   POLYPECTOMY     Family History  Problem Relation Age of Onset   Heart disease Mother    Heart failure Mother        age 12   Heart disease Father    Heart attack Father 105   COPD Brother    Suicidality Daughter    Dementia Paternal Grandfather    Colon cancer Neg Hx    Stomach cancer Neg Hx    Esophageal cancer Neg Hx    Liver cancer Neg Hx    Pancreatic cancer Neg Hx    Prostate cancer Neg Hx    Rectal cancer Neg Hx    Ulcerative colitis Neg Hx    Social History   Occupational History   Occupation: High school Principal    Comment: retired  Tobacco Use   Smoking status: Never   Smokeless tobacco: Never  Vaping Use   Vaping status: Never Used  Substance and Sexual Activity   Alcohol use: Yes    Comment: 1-2 month   Drug use: No   Sexual activity: Yes   Tobacco Counseling Counseling given: Yes  SDOH Screenings   Food Insecurity: No Food Insecurity (08/17/2022)  Housing: Unknown (01/14/2024)  Transportation Needs: No Transportation Needs (01/14/2024)  Utilities: Not At Risk (01/14/2024)  Alcohol Screen: Low Risk (08/17/2022)  Depression (PHQ2-9): Low Risk (01/14/2024)  Financial Resource Strain: Low Risk (08/17/2022)  Physical Activity: Sufficiently Active (01/14/2024)  Social  Connections: Socially Integrated (01/14/2024)  Stress: No Stress Concern Present (01/14/2024)  Tobacco Use: Low Risk (01/14/2024)  Health Literacy: Adequate Health Literacy (01/14/2024)   See flowsheets for full screening details  Depression Screen PHQ 2 & 9 Depression Scale- Over the past 2 weeks, how often have you been bothered by any of the following problems? Little interest or pleasure in doing things: 0 Feeling down, depressed, or hopeless (PHQ Adolescent also includes...irritable): 0 PHQ-2 Total Score: 0 Trouble falling or staying asleep, or sleeping too much: 0 Feeling tired or having little energy: 0 Poor appetite or overeating (PHQ Adolescent also includes...weight loss): 0 Feeling bad about yourself - or that you are a failure or have let yourself or your family down: 0 Trouble concentrating on things, such as reading the newspaper or watching television (PHQ Adolescent also includes...like school work): 0 Moving or speaking so slowly that other people could have noticed. Or the opposite - being so fidgety or restless that you have been moving around a lot more than usual: 0 Thoughts that you would be better off dead, or of hurting yourself in some way: 0 PHQ-9  Total Score: 0 If you checked off any problems, how difficult have these problems made it for you to do your work, take care of things at home, or get along with other people?: Not difficult at all     Goals Addressed             This Visit's Progress    Exercise 150 min/wk Moderate Activity   On track    Continue 10,000 steps per day Maintain weight under 170# Stay healthy, prevent illness             Objective:    Today's Vitals   01/14/24 1412  Weight: 175 lb (79.4 kg)  Height: 5' 8 (1.727 m)   Body mass index is 26.61 kg/m.  Hearing/Vision screen No results found. Immunizations and Health Maintenance Health Maintenance  Topic Date Due   COVID-19 Vaccine (7 - Moderna risk 2025-26 season)  04/15/2024   Medicare Annual Wellness (AWV)  01/13/2025   DTaP/Tdap/Td (3 - Td or Tdap) 08/26/2032   Pneumococcal Vaccine: 50+ Years  Completed   Influenza Vaccine  Completed   Zoster Vaccines- Shingrix   Completed   Meningococcal B Vaccine  Aged Out   Colonoscopy  Discontinued   Hepatitis C Screening  Discontinued        Assessment/Plan:  This is a routine wellness examination for Brad May.  Patient Care Team: Zollie Lowers, MD as PCP - General (Family Medicine) Pyrtle, Gordy HERO, MD as Consulting Physician (Gastroenterology) Cloretta Arley NOVAK, MD as Consulting Physician (Oncology)  I have personally reviewed and noted the following in the patients chart:   Medical and social history Use of alcohol, tobacco or illicit drugs  Current medications and supplements including opioid prescriptions. Functional ability and status Nutritional status Physical activity Advanced directives List of other physicians Hospitalizations, surgeries, and ER visits in previous 12 months Vitals Screenings to include cognitive, depression, and falls Referrals and appointments  No orders of the defined types were placed in this encounter.  In addition, I have reviewed and discussed with patient certain preventive protocols, quality metrics, and best practice recommendations. A written personalized care plan for preventive services as well as general preventive health recommendations were provided to patient.   Brad May, CMA   01/14/2024   Return in 1 year (on 01/13/2025).  After Visit Summary: (MyChart) Due to this being a telephonic visit, the after visit summary with patients personalized plan was offered to patient via MyChart   Nurse Notes: n/a "

## 2024-01-17 NOTE — Progress Notes (Signed)
 "  Chief Complaint  Patient presents with   Medicare Wellness     Subjective:   Brad May is a 84 y.o. male who presents for a Medicare Annual Wellness Visit.  Visit info / Clinical Intake: Medicare Wellness Visit Type:: Subsequent Annual Wellness Visit Persons participating in visit and providing information:: patient Medicare Wellness Visit Mode:: In-person (required for WTM) Interpreter Needed?: No Pre-visit prep was completed: yes AWV questionnaire completed by patient prior to visit?: no Living arrangements:: lives with spouse/significant other Patient's Overall Health Status Rating: very good Typical amount of pain: none Does pain affect daily life?: no Are you currently prescribed opioids?: no  Dietary Habits and Nutritional Risks How many meals a day?: 3 Eats fruit and vegetables daily?: yes Most meals are obtained by: preparing own meals In the last 2 weeks, have you had any of the following?: none Diabetic:: no  Functional Status Activities of Daily Living (to include ambulation/medication): Independent Ambulation: Independent Medication Administration: Independent Home Management (perform basic housework or laundry): Independent Manage your own finances?: yes Primary transportation is: driving Concerns about vision?: no *vision screening is required for WTM* (last ov 6/25/[facility] in [place]) Concerns about hearing?: (!) yes Uses hearing aids?: no  Fall Screening Falls in the past year?: 0 Number of falls in past year: 0 Was there an injury with Fall?: 0 Fall Risk Category Calculator: 0 Patient Fall Risk Level: Low Fall Risk  Fall Risk Patient at Risk for Falls Due to: No Fall Risks Fall risk Follow up: Falls evaluation completed; Education provided  Home and Transportation Safety: All rugs have non-skid backing?: yes All stairs or steps have railings?: yes Grab bars in the bathtub or shower?: yes Have non-skid surface  in bathtub or shower?: yes Good home lighting?: yes Regular seat belt use?: yes Hospital stays in the last year:: no  Cognitive Assessment Difficulty concentrating, remembering, or making decisions? : yes (remembering) Will 6CIT or Mini Cog be Completed: yes What year is it?: 0 points What month is it?: 0 points Give patient an address phrase to remember (5 components): [address] [place] About what time is it?: 0 points Count backwards from 20 to 1: 0 points Say the months of the year in reverse: 0 points Repeat the address phrase from earlier: 0 points 6 CIT Score: 0 points  Advance Directives (For Healthcare) Does Patient Have a Medical Advance Directive?: Yes Does patient want to make changes to medical advance directive?: No - Patient declined Type of Advance Directive: Living will; Healthcare Power of Attorney Copy of Healthcare Power of Attorney in Chart?: No - copy requested Copy of Living Will in Chart?: No - copy requested  Reviewed/Updated  Reviewed/Updated: Reviewed All (Medical, Surgical, Family, Medications, Allergies, Care Teams, Patient Goals); Medical History; Surgical History; Family History; Medications; Allergies; Care Teams; Patient Goals    Allergies (verified) Sulfonamide derivatives   Current Medications (verified) Outpatient Encounter Medications as of 01/14/2024  Medication Sig   aspirin 81 MG tablet Take 81 mg by mouth daily.   atorvastatin  (LIPITOR) 40 MG tablet Take 40 mg by mouth daily.   b complex vitamins tablet Take 1 tablet by mouth daily.   Cholecalciferol 25 MCG (1000 UT) tablet Take 2,000 Units by mouth daily.    levothyroxine  (SYNTHROID ) 88 MCG tablet TAKE 1 TABLET BY MOUTH EVERY DAY   Probiotic Product (PROBIOTIC-10 PO) Take 1 capsule by mouth daily.    sildenafil  (REVATIO ) 20 MG tablet TAKE 2-5 TABLETS BY  MOUTH AS NEEDED FOR SEXUAL ACTIVITY   nystatin  (MYCOSTATIN ) 100000 UNIT/ML suspension Take 5 mLs (500,000 Units total) by  mouth 4 (four) times daily. Swish in mouth and spit out (Patient not taking: Reported on 01/14/2024)   No facility-administered encounter medications on file as of 01/14/2024.    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  Bilateral     Thyroid  disease    Tubular adenoma of colon    Past Surgical History:  Procedure Laterality Date   APPENDECTOMY     COLONOSCOPY     COLONOSCOPY W/ POLYPECTOMY     INGUINAL HERNIA REPAIR  2006   left   POLYPECTOMY     Family History  Problem Relation Age of Onset   Heart disease Mother    Heart failure Mother        age 9   Heart disease Father    Heart attack Father 58   COPD Brother    Suicidality Daughter    Dementia Paternal Grandfather    Colon cancer Neg Hx    Stomach cancer Neg Hx    Esophageal cancer Neg Hx    Liver cancer Neg Hx    Pancreatic cancer Neg Hx    Prostate cancer Neg Hx    Rectal cancer Neg Hx    Ulcerative colitis Neg Hx    Social History   Occupational History   Occupation: High school Principal    Comment: retired  Tobacco Use   Smoking status: Never   Smokeless tobacco: Never  Vaping Use   Vaping status: Never Used  Substance and Sexual Activity   Alcohol use: Yes    Comment: 1-2 month   Drug use: No   Sexual activity: Yes   Tobacco Counseling Counseling given: Yes  SDOH Screenings   Food Insecurity: No Food Insecurity (08/17/2022)  Housing: Unknown (01/14/2024)  Transportation Needs: No Transportation Needs (01/14/2024)  Utilities: Not At Risk (01/14/2024)  Alcohol Screen: Low Risk (08/17/2022)  Depression (PHQ2-9): Low Risk (01/14/2024)  Financial Resource Strain: Low Risk  (08/17/2022)  Physical Activity: Sufficiently Active (01/14/2024)  Social Connections: Socially Integrated (01/14/2024)  Stress: No Stress Concern Present (01/14/2024)  Tobacco Use: Low Risk (01/14/2024)  Health Literacy: Adequate Health Literacy (01/14/2024)   See flowsheets for full screening details  Depression Screen PHQ 2 & 9 Depression Scale- Over the past 2 weeks, how often have you been bothered by any of the following problems? Little interest or pleasure in doing things: 0 Feeling down, depressed, or hopeless (PHQ Adolescent also includes...irritable): 0 PHQ-2 Total Score: 0 Trouble falling or staying asleep, or sleeping too much: 0 Feeling tired or having little energy: 0 Poor appetite or overeating (PHQ Adolescent also includes...weight loss): 0 Feeling bad about yourself - or that you are a failure or have let yourself or your family down: 0 Trouble concentrating on things, such as reading the newspaper or watching television (PHQ Adolescent also includes...like school work): 0 Moving or speaking so slowly that other people could have noticed. Or the opposite - being so fidgety or restless that you have been moving around a lot more than usual: 0 Thoughts that you  would be better off dead, or of hurting yourself in some way: 0 PHQ-9 Total Score: 0 If you checked off any problems, how difficult have these problems made it for you to do your work, take care of things at home, or get along with other people?: Not difficult at all     Goals Addressed             This Visit's Progress    Exercise 150 min/wk Moderate Activity   On track    Continue 10,000 steps per day Maintain weight under 170# Stay healthy, prevent illness             Objective:    Today's Vitals   01/14/24 1412  BP: (!) 143/83  Pulse: 80  Temp: 97.6 F (36.4 C)  TempSrc: Oral  Weight: 175 lb (79.4 kg)  Height: 5' 8 (1.727 m)   Body mass index is 26.61 kg/m.  Hearing/Vision screen No  results found. Immunizations and Health Maintenance Health Maintenance  Topic Date Due   COVID-19 Vaccine (7 - Moderna risk 2025-26 season) 04/15/2024   Medicare Annual Wellness (AWV)  01/13/2025   DTaP/Tdap/Td (3 - Td or Tdap) 08/26/2032   Pneumococcal Vaccine: 50+ Years  Completed   Influenza Vaccine  Completed   Zoster Vaccines- Shingrix   Completed   Meningococcal B Vaccine  Aged Out   Colonoscopy  Discontinued   Hepatitis C Screening  Discontinued        Assessment/Plan:  This is a routine wellness examination for Erich.  Patient Care Team: Zollie Lowers, MD as PCP - General (Family Medicine) Pyrtle, Gordy HERO, MD as Consulting Physician (Gastroenterology) Cloretta Arley NOVAK, MD as Consulting Physician (Oncology)  I have personally reviewed and noted the following in the patients chart:   Medical and social history Use of alcohol, tobacco or illicit drugs  Current medications and supplements including opioid prescriptions. Functional ability and status Nutritional status Physical activity Advanced directives List of other physicians Hospitalizations, surgeries, and ER visits in previous 12 months Vitals Screenings to include cognitive, depression, and falls Referrals and appointments  No orders of the defined types were placed in this encounter.  In addition, I have reviewed and discussed with patient certain preventive protocols, quality metrics, and best practice recommendations. A written personalized care plan for preventive services as well as general preventive health recommendations were provided to patient.   Ozie Ned, CMA   01/17/2024   Return in 1 year (on 01/13/2025).  After Visit Summary: (MyChart) Due to this being a telephonic visit, the after visit summary with patients personalized plan was offered to patient via MyChart   Nurse Notes: n/a "

## 2024-06-02 ENCOUNTER — Encounter: Admitting: Family Medicine
